# Patient Record
Sex: Female | Born: 1955 | Race: White | Hispanic: No | Marital: Single | Smoking: Heavy tobacco smoker
Health system: Southern US, Community
[De-identification: ages and names within clinical notes are randomized; demographics above are authoritative.]

## PROBLEM LIST (undated history)

## (undated) DIAGNOSIS — Z8709 Personal history of other diseases of the respiratory system: Secondary | ICD-10-CM

## (undated) DIAGNOSIS — F32A Depression, unspecified: Secondary | ICD-10-CM

## (undated) DIAGNOSIS — A419 Sepsis, unspecified organism: Secondary | ICD-10-CM

## (undated) DIAGNOSIS — G43909 Migraine, unspecified, not intractable, without status migrainosus: Secondary | ICD-10-CM

## (undated) DIAGNOSIS — Z87898 Personal history of other specified conditions: Secondary | ICD-10-CM

## (undated) DIAGNOSIS — I499 Cardiac arrhythmia, unspecified: Secondary | ICD-10-CM

## (undated) DIAGNOSIS — K759 Inflammatory liver disease, unspecified: Secondary | ICD-10-CM

## (undated) DIAGNOSIS — M1990 Unspecified osteoarthritis, unspecified site: Secondary | ICD-10-CM

## (undated) DIAGNOSIS — Z87440 Personal history of urinary (tract) infections: Secondary | ICD-10-CM

## (undated) DIAGNOSIS — I4819 Other persistent atrial fibrillation: Secondary | ICD-10-CM

## (undated) DIAGNOSIS — K644 Residual hemorrhoidal skin tags: Secondary | ICD-10-CM

## (undated) DIAGNOSIS — I2510 Atherosclerotic heart disease of native coronary artery without angina pectoris: Secondary | ICD-10-CM

## (undated) DIAGNOSIS — C801 Malignant (primary) neoplasm, unspecified: Secondary | ICD-10-CM

## (undated) DIAGNOSIS — G4730 Sleep apnea, unspecified: Secondary | ICD-10-CM

## (undated) DIAGNOSIS — E663 Overweight: Secondary | ICD-10-CM

## (undated) DIAGNOSIS — J189 Pneumonia, unspecified organism: Secondary | ICD-10-CM

## (undated) DIAGNOSIS — G8929 Other chronic pain: Secondary | ICD-10-CM

## (undated) DIAGNOSIS — F419 Anxiety disorder, unspecified: Secondary | ICD-10-CM

## (undated) DIAGNOSIS — F329 Major depressive disorder, single episode, unspecified: Secondary | ICD-10-CM

## (undated) DIAGNOSIS — Z716 Tobacco abuse counseling: Secondary | ICD-10-CM

## (undated) DIAGNOSIS — I10 Essential (primary) hypertension: Secondary | ICD-10-CM

## (undated) DIAGNOSIS — I219 Acute myocardial infarction, unspecified: Secondary | ICD-10-CM

## (undated) DIAGNOSIS — E785 Hyperlipidemia, unspecified: Secondary | ICD-10-CM

## (undated) HISTORY — PX: BREAST SURGERY: SHX581

## (undated) HISTORY — PX: CORONARY ANGIOPLASTY WITH STENT PLACEMENT: SHX49

## (undated) HISTORY — DX: Residual hemorrhoidal skin tags: K64.4

## (undated) HISTORY — DX: Other persistent atrial fibrillation: I48.19

## (undated) HISTORY — PX: ABDOMINAL HYSTERECTOMY: SHX81

## (undated) HISTORY — PX: ABDOMINAL SURGERY: SHX537

## (undated) HISTORY — PX: REPLACEMENT TOTAL KNEE: SUR1224

## (undated) HISTORY — DX: Overweight: E66.3

## (undated) HISTORY — PX: JOINT REPLACEMENT: SHX530

## (undated) HISTORY — PX: KNEE SURGERY: SHX244

## (undated) HISTORY — PX: TONSILLECTOMY AND ADENOIDECTOMY: SUR1326

## (undated) HISTORY — DX: Tobacco abuse counseling: Z71.6

## (undated) HISTORY — DX: Atherosclerotic heart disease of native coronary artery without angina pectoris: I25.10

## (undated) HISTORY — PX: TMJ ARTHROPLASTY: SHX1066

---

## 1998-06-24 ENCOUNTER — Emergency Department (HOSPITAL_COMMUNITY): Admission: EM | Admit: 1998-06-24 | Discharge: 1998-06-25 | Payer: Self-pay | Admitting: Emergency Medicine

## 1998-12-11 ENCOUNTER — Encounter: Payer: Self-pay | Admitting: Internal Medicine

## 1998-12-11 ENCOUNTER — Inpatient Hospital Stay (HOSPITAL_COMMUNITY): Admission: AD | Admit: 1998-12-11 | Discharge: 1998-12-13 | Payer: Self-pay | Admitting: Internal Medicine

## 1998-12-13 ENCOUNTER — Encounter: Payer: Self-pay | Admitting: Internal Medicine

## 1999-02-17 ENCOUNTER — Ambulatory Visit: Admission: RE | Admit: 1999-02-17 | Discharge: 1999-02-17 | Payer: Self-pay | Admitting: Internal Medicine

## 1999-03-20 ENCOUNTER — Encounter: Payer: Self-pay | Admitting: Nephrology

## 1999-03-20 ENCOUNTER — Observation Stay (HOSPITAL_COMMUNITY): Admission: RE | Admit: 1999-03-20 | Discharge: 1999-03-21 | Payer: Self-pay | Admitting: Nephrology

## 1999-03-20 ENCOUNTER — Encounter (INDEPENDENT_AMBULATORY_CARE_PROVIDER_SITE_OTHER): Payer: Self-pay | Admitting: *Deleted

## 2000-10-27 ENCOUNTER — Encounter (INDEPENDENT_AMBULATORY_CARE_PROVIDER_SITE_OTHER): Payer: Self-pay | Admitting: Specialist

## 2000-10-27 ENCOUNTER — Ambulatory Visit (HOSPITAL_BASED_OUTPATIENT_CLINIC_OR_DEPARTMENT_OTHER): Admission: RE | Admit: 2000-10-27 | Discharge: 2000-10-27 | Payer: Self-pay | Admitting: Orthopedic Surgery

## 2000-12-02 ENCOUNTER — Other Ambulatory Visit: Admission: RE | Admit: 2000-12-02 | Discharge: 2000-12-02 | Payer: Self-pay | Admitting: Obstetrics and Gynecology

## 2001-01-21 ENCOUNTER — Encounter: Payer: Self-pay | Admitting: Emergency Medicine

## 2001-01-21 ENCOUNTER — Observation Stay (HOSPITAL_COMMUNITY): Admission: EM | Admit: 2001-01-21 | Discharge: 2001-01-22 | Payer: Self-pay | Admitting: Emergency Medicine

## 2001-01-22 ENCOUNTER — Encounter: Payer: Self-pay | Admitting: Internal Medicine

## 2001-01-23 ENCOUNTER — Encounter: Payer: Self-pay | Admitting: Internal Medicine

## 2001-01-23 ENCOUNTER — Ambulatory Visit (HOSPITAL_COMMUNITY): Admission: RE | Admit: 2001-01-23 | Discharge: 2001-01-23 | Payer: Self-pay | Admitting: Internal Medicine

## 2001-12-02 ENCOUNTER — Ambulatory Visit (HOSPITAL_BASED_OUTPATIENT_CLINIC_OR_DEPARTMENT_OTHER): Admission: RE | Admit: 2001-12-02 | Discharge: 2001-12-02 | Payer: Self-pay | Admitting: Orthopedic Surgery

## 2003-06-14 ENCOUNTER — Inpatient Hospital Stay (HOSPITAL_COMMUNITY): Admission: EM | Admit: 2003-06-14 | Discharge: 2003-06-17 | Payer: Self-pay | Admitting: Emergency Medicine

## 2005-04-05 ENCOUNTER — Encounter: Admission: RE | Admit: 2005-04-05 | Discharge: 2005-04-05 | Payer: Self-pay | Admitting: Neurosurgery

## 2005-05-07 ENCOUNTER — Other Ambulatory Visit: Admission: RE | Admit: 2005-05-07 | Discharge: 2005-05-07 | Payer: Self-pay | Admitting: Obstetrics and Gynecology

## 2005-06-18 ENCOUNTER — Encounter (INDEPENDENT_AMBULATORY_CARE_PROVIDER_SITE_OTHER): Payer: Self-pay | Admitting: Specialist

## 2005-06-18 ENCOUNTER — Inpatient Hospital Stay (HOSPITAL_COMMUNITY): Admission: RE | Admit: 2005-06-18 | Discharge: 2005-06-20 | Payer: Self-pay | Admitting: Obstetrics and Gynecology

## 2007-02-17 ENCOUNTER — Ambulatory Visit: Payer: Self-pay | Admitting: Physical Medicine & Rehabilitation

## 2007-02-17 ENCOUNTER — Encounter
Admission: RE | Admit: 2007-02-17 | Discharge: 2007-05-18 | Payer: Self-pay | Admitting: Physical Medicine & Rehabilitation

## 2007-05-18 ENCOUNTER — Ambulatory Visit (HOSPITAL_BASED_OUTPATIENT_CLINIC_OR_DEPARTMENT_OTHER): Admission: RE | Admit: 2007-05-18 | Discharge: 2007-05-18 | Payer: Self-pay | Admitting: Orthopedic Surgery

## 2007-05-23 ENCOUNTER — Encounter
Admission: RE | Admit: 2007-05-23 | Discharge: 2007-08-21 | Payer: Self-pay | Admitting: Physical Medicine & Rehabilitation

## 2007-06-03 ENCOUNTER — Ambulatory Visit: Payer: Self-pay | Admitting: Infectious Diseases

## 2007-06-03 ENCOUNTER — Ambulatory Visit (HOSPITAL_COMMUNITY): Admission: RE | Admit: 2007-06-03 | Discharge: 2007-06-07 | Payer: Self-pay | Admitting: Orthopedic Surgery

## 2007-06-17 ENCOUNTER — Encounter (INDEPENDENT_AMBULATORY_CARE_PROVIDER_SITE_OTHER): Payer: Self-pay | Admitting: Orthopedic Surgery

## 2007-06-17 ENCOUNTER — Inpatient Hospital Stay (HOSPITAL_COMMUNITY): Admission: RE | Admit: 2007-06-17 | Discharge: 2007-07-02 | Payer: Self-pay | Admitting: Orthopedic Surgery

## 2007-06-22 ENCOUNTER — Encounter (INDEPENDENT_AMBULATORY_CARE_PROVIDER_SITE_OTHER): Payer: Self-pay | Admitting: Orthopedic Surgery

## 2007-06-22 ENCOUNTER — Ambulatory Visit: Payer: Self-pay | Admitting: Surgery

## 2007-07-18 ENCOUNTER — Ambulatory Visit: Payer: Self-pay | Admitting: Infectious Disease

## 2007-07-18 DIAGNOSIS — M009 Pyogenic arthritis, unspecified: Secondary | ICD-10-CM | POA: Insufficient documentation

## 2007-07-18 DIAGNOSIS — F17200 Nicotine dependence, unspecified, uncomplicated: Secondary | ICD-10-CM | POA: Insufficient documentation

## 2007-07-18 DIAGNOSIS — D649 Anemia, unspecified: Secondary | ICD-10-CM | POA: Insufficient documentation

## 2007-07-18 DIAGNOSIS — F3289 Other specified depressive episodes: Secondary | ICD-10-CM | POA: Insufficient documentation

## 2007-07-18 DIAGNOSIS — L538 Other specified erythematous conditions: Secondary | ICD-10-CM | POA: Insufficient documentation

## 2007-07-18 DIAGNOSIS — G8929 Other chronic pain: Secondary | ICD-10-CM | POA: Insufficient documentation

## 2007-07-18 DIAGNOSIS — G894 Chronic pain syndrome: Secondary | ICD-10-CM | POA: Insufficient documentation

## 2007-07-18 DIAGNOSIS — F329 Major depressive disorder, single episode, unspecified: Secondary | ICD-10-CM | POA: Insufficient documentation

## 2007-07-18 DIAGNOSIS — R21 Rash and other nonspecific skin eruption: Secondary | ICD-10-CM | POA: Insufficient documentation

## 2007-07-21 ENCOUNTER — Ambulatory Visit: Payer: Self-pay | Admitting: Physical Medicine & Rehabilitation

## 2007-07-29 ENCOUNTER — Telehealth: Payer: Self-pay | Admitting: Infectious Disease

## 2007-08-01 ENCOUNTER — Ambulatory Visit (HOSPITAL_COMMUNITY): Admission: RE | Admit: 2007-08-01 | Discharge: 2007-08-01 | Payer: Self-pay | Admitting: Infectious Disease

## 2007-08-02 ENCOUNTER — Ambulatory Visit: Payer: Self-pay | Admitting: Physical Medicine & Rehabilitation

## 2007-08-08 ENCOUNTER — Ambulatory Visit: Payer: Self-pay | Admitting: Infectious Disease

## 2007-08-09 ENCOUNTER — Encounter: Payer: Self-pay | Admitting: Infectious Disease

## 2007-08-11 ENCOUNTER — Telehealth: Payer: Self-pay | Admitting: Infectious Disease

## 2007-08-15 ENCOUNTER — Encounter: Payer: Self-pay | Admitting: Infectious Disease

## 2007-08-24 ENCOUNTER — Telehealth: Payer: Self-pay | Admitting: Infectious Disease

## 2007-08-25 ENCOUNTER — Encounter: Payer: Self-pay | Admitting: Infectious Disease

## 2007-08-25 ENCOUNTER — Encounter
Admission: RE | Admit: 2007-08-25 | Discharge: 2007-11-23 | Payer: Self-pay | Admitting: Physical Medicine & Rehabilitation

## 2007-08-27 ENCOUNTER — Ambulatory Visit (HOSPITAL_COMMUNITY): Admission: RE | Admit: 2007-08-27 | Discharge: 2007-08-27 | Payer: Self-pay | Admitting: Infectious Disease

## 2007-08-29 ENCOUNTER — Telehealth: Payer: Self-pay

## 2007-08-29 ENCOUNTER — Encounter: Payer: Self-pay | Admitting: Infectious Disease

## 2007-09-06 ENCOUNTER — Ambulatory Visit: Payer: Self-pay | Admitting: Physical Medicine & Rehabilitation

## 2007-09-13 ENCOUNTER — Encounter: Payer: Self-pay | Admitting: Infectious Disease

## 2007-10-04 ENCOUNTER — Telehealth: Payer: Self-pay | Admitting: Infectious Disease

## 2007-10-20 ENCOUNTER — Ambulatory Visit: Payer: Self-pay | Admitting: Physical Medicine & Rehabilitation

## 2007-11-07 ENCOUNTER — Encounter: Payer: Self-pay | Admitting: Infectious Disease

## 2007-11-21 ENCOUNTER — Inpatient Hospital Stay (HOSPITAL_COMMUNITY): Admission: RE | Admit: 2007-11-21 | Discharge: 2007-11-25 | Payer: Self-pay | Admitting: Orthopedic Surgery

## 2007-11-21 ENCOUNTER — Encounter (INDEPENDENT_AMBULATORY_CARE_PROVIDER_SITE_OTHER): Payer: Self-pay | Admitting: Orthopedic Surgery

## 2007-12-19 ENCOUNTER — Ambulatory Visit: Payer: Self-pay | Admitting: Physical Medicine & Rehabilitation

## 2007-12-19 ENCOUNTER — Encounter
Admission: RE | Admit: 2007-12-19 | Discharge: 2008-03-18 | Payer: Self-pay | Admitting: Physical Medicine & Rehabilitation

## 2008-01-17 ENCOUNTER — Ambulatory Visit: Payer: Self-pay | Admitting: Physical Medicine & Rehabilitation

## 2008-01-26 ENCOUNTER — Ambulatory Visit: Payer: Self-pay | Admitting: Physical Medicine & Rehabilitation

## 2008-02-16 ENCOUNTER — Ambulatory Visit: Payer: Self-pay | Admitting: Physical Medicine & Rehabilitation

## 2008-03-15 ENCOUNTER — Ambulatory Visit: Payer: Self-pay | Admitting: Physical Medicine & Rehabilitation

## 2008-03-19 ENCOUNTER — Encounter: Payer: Self-pay | Admitting: Infectious Disease

## 2008-04-06 ENCOUNTER — Encounter
Admission: RE | Admit: 2008-04-06 | Discharge: 2008-04-30 | Payer: Self-pay | Admitting: Physical Medicine & Rehabilitation

## 2008-04-08 ENCOUNTER — Ambulatory Visit (HOSPITAL_COMMUNITY)
Admission: RE | Admit: 2008-04-08 | Discharge: 2008-04-08 | Payer: Self-pay | Admitting: Physical Medicine & Rehabilitation

## 2008-04-09 ENCOUNTER — Ambulatory Visit: Payer: Self-pay | Admitting: Physical Medicine & Rehabilitation

## 2008-04-12 ENCOUNTER — Ambulatory Visit: Payer: Self-pay | Admitting: Physical Medicine & Rehabilitation

## 2008-04-30 ENCOUNTER — Ambulatory Visit: Payer: Self-pay | Admitting: Physical Medicine & Rehabilitation

## 2008-05-25 ENCOUNTER — Ambulatory Visit: Payer: Self-pay | Admitting: Physical Medicine & Rehabilitation

## 2008-05-25 ENCOUNTER — Encounter
Admission: RE | Admit: 2008-05-25 | Discharge: 2008-08-17 | Payer: Self-pay | Admitting: Physical Medicine & Rehabilitation

## 2008-06-22 ENCOUNTER — Ambulatory Visit: Payer: Self-pay | Admitting: Physical Medicine & Rehabilitation

## 2008-07-09 ENCOUNTER — Ambulatory Visit (HOSPITAL_COMMUNITY): Admission: RE | Admit: 2008-07-09 | Discharge: 2008-07-09 | Payer: Self-pay | Admitting: Orthopedic Surgery

## 2008-07-20 ENCOUNTER — Ambulatory Visit: Payer: Self-pay | Admitting: Physical Medicine & Rehabilitation

## 2008-08-17 ENCOUNTER — Encounter
Admission: RE | Admit: 2008-08-17 | Discharge: 2008-08-22 | Payer: Self-pay | Admitting: Physical Medicine & Rehabilitation

## 2008-08-22 ENCOUNTER — Ambulatory Visit: Payer: Self-pay | Admitting: Physical Medicine & Rehabilitation

## 2009-09-14 IMAGING — CR DG CHEST 2V
2 series · 2 of 2 positions shown · non-contrast
Comparison: 06/14/2003

CLINICAL DATA: Preoperative respiratory film for a patient with right knee infection. 
 CHEST ? 2 VIEW:

[view not recorded (1 of 2)]
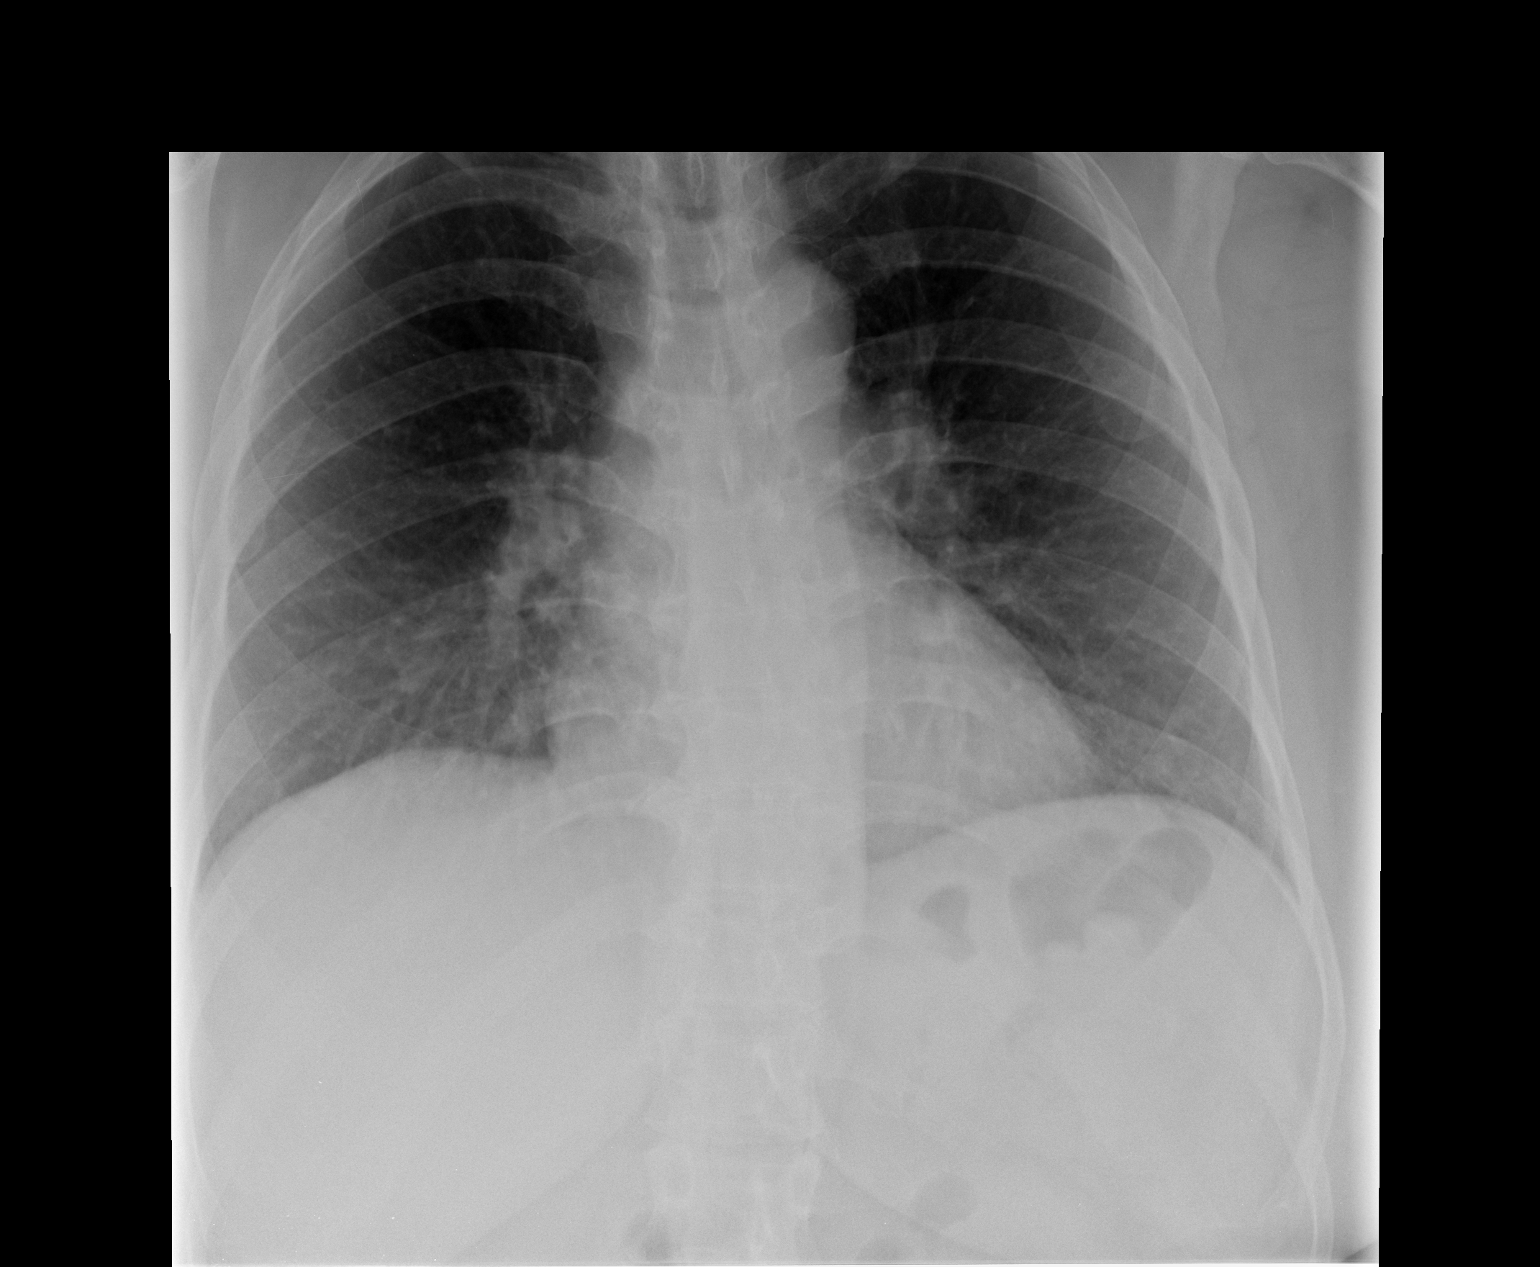

[view not recorded (2 of 2)]
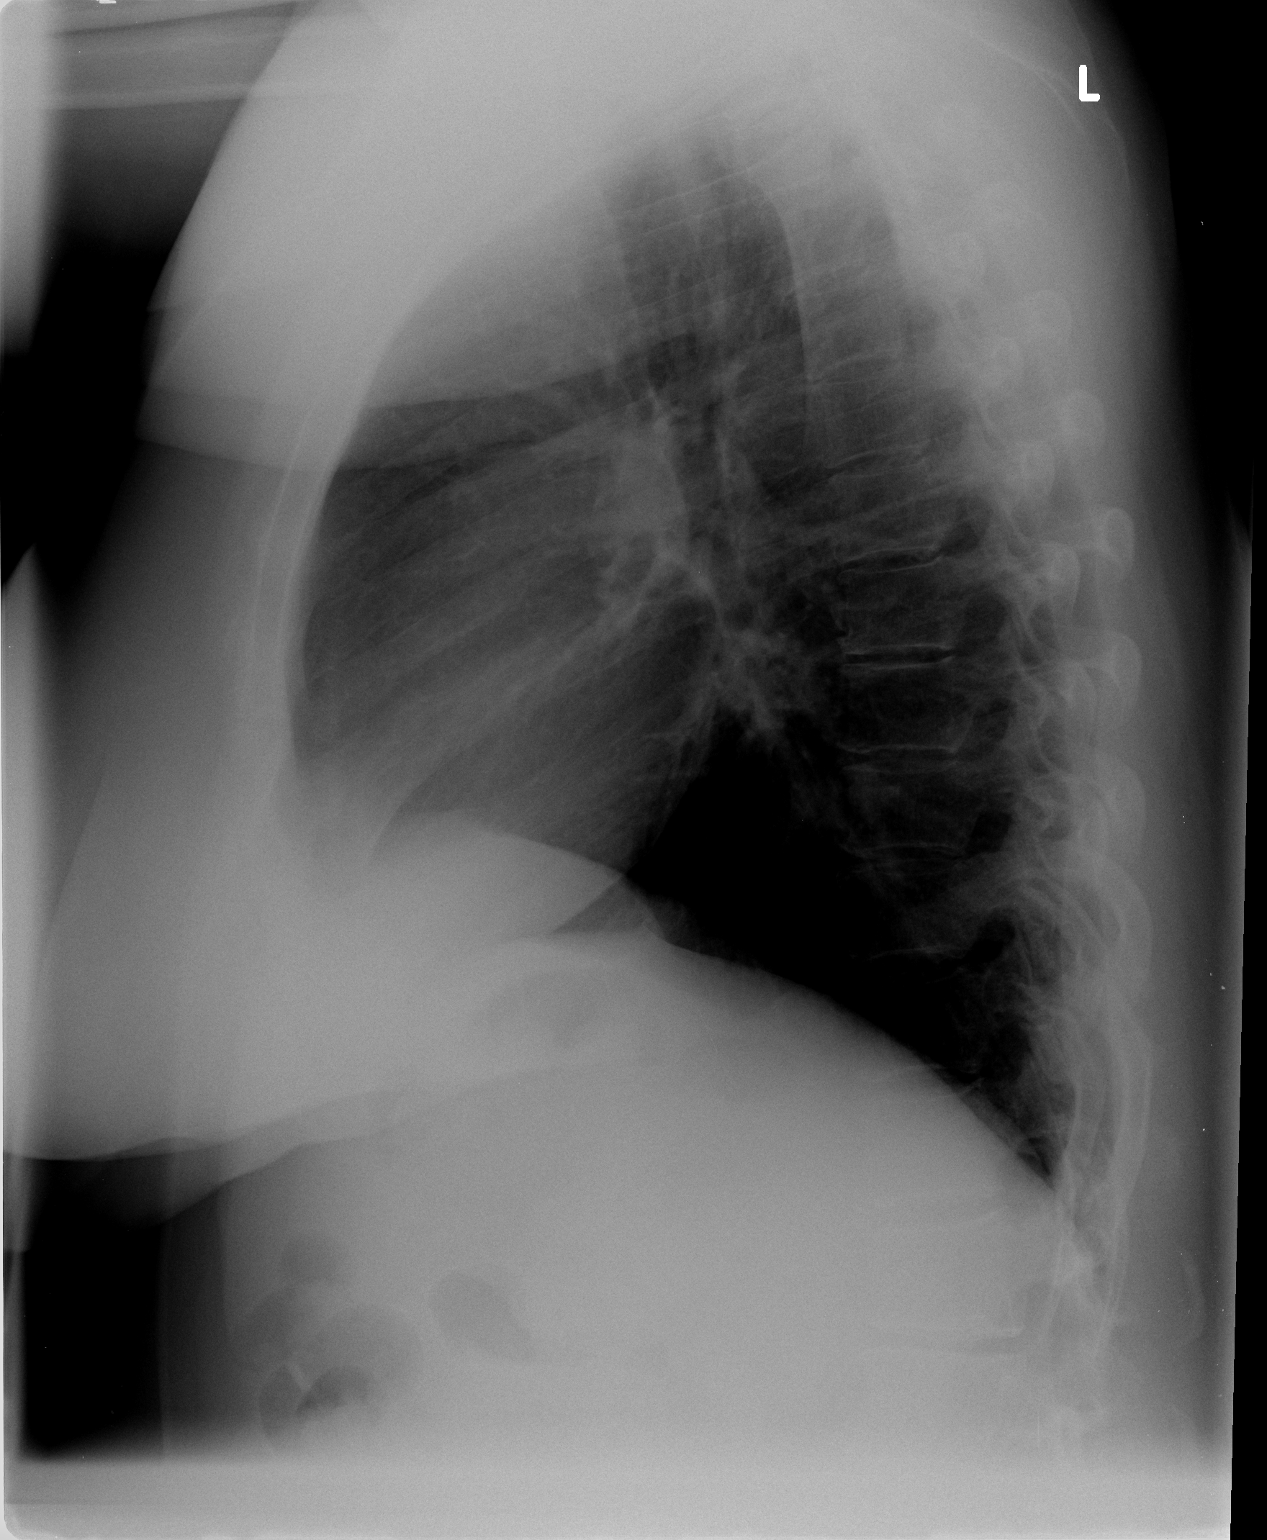

[2 of 2 positions shown; findings below may reference images not displayed]

FINDINGS: Lungs are clear.  Heart size is normal. No effusion or focal bony abnormality.
IMPRESSION: No acute disease.

## 2009-09-17 IMAGING — CR DG CHEST 1V PORT
2 series · 2 of 2 positions shown · non-contrast
Comparison: none

CLINICAL DATA: Knee infection, PICC placement

Portable chest at 9994:
Comparison 06/03/2007. Left arm PICC extends to the distal SVC. Lungs clear. No
effusion. Heart size is normal.

[AP (1 of 2)]
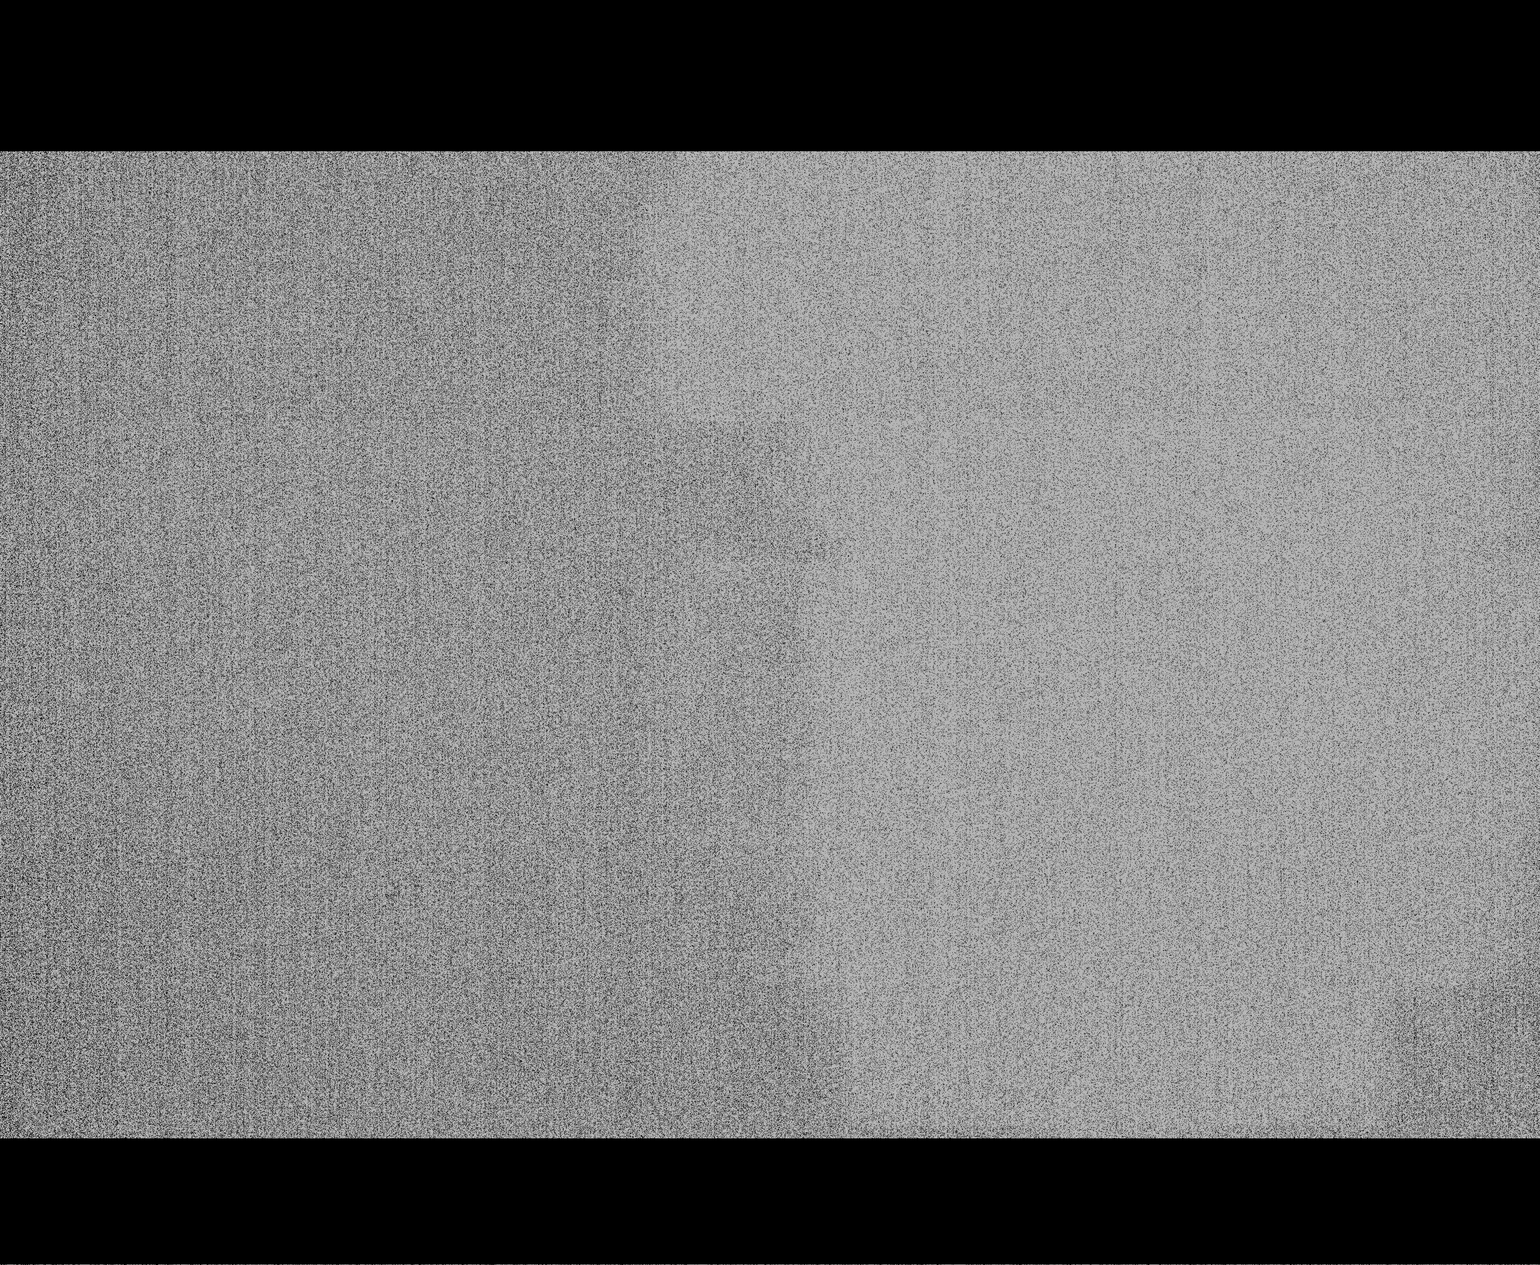

[AP (2 of 2)]
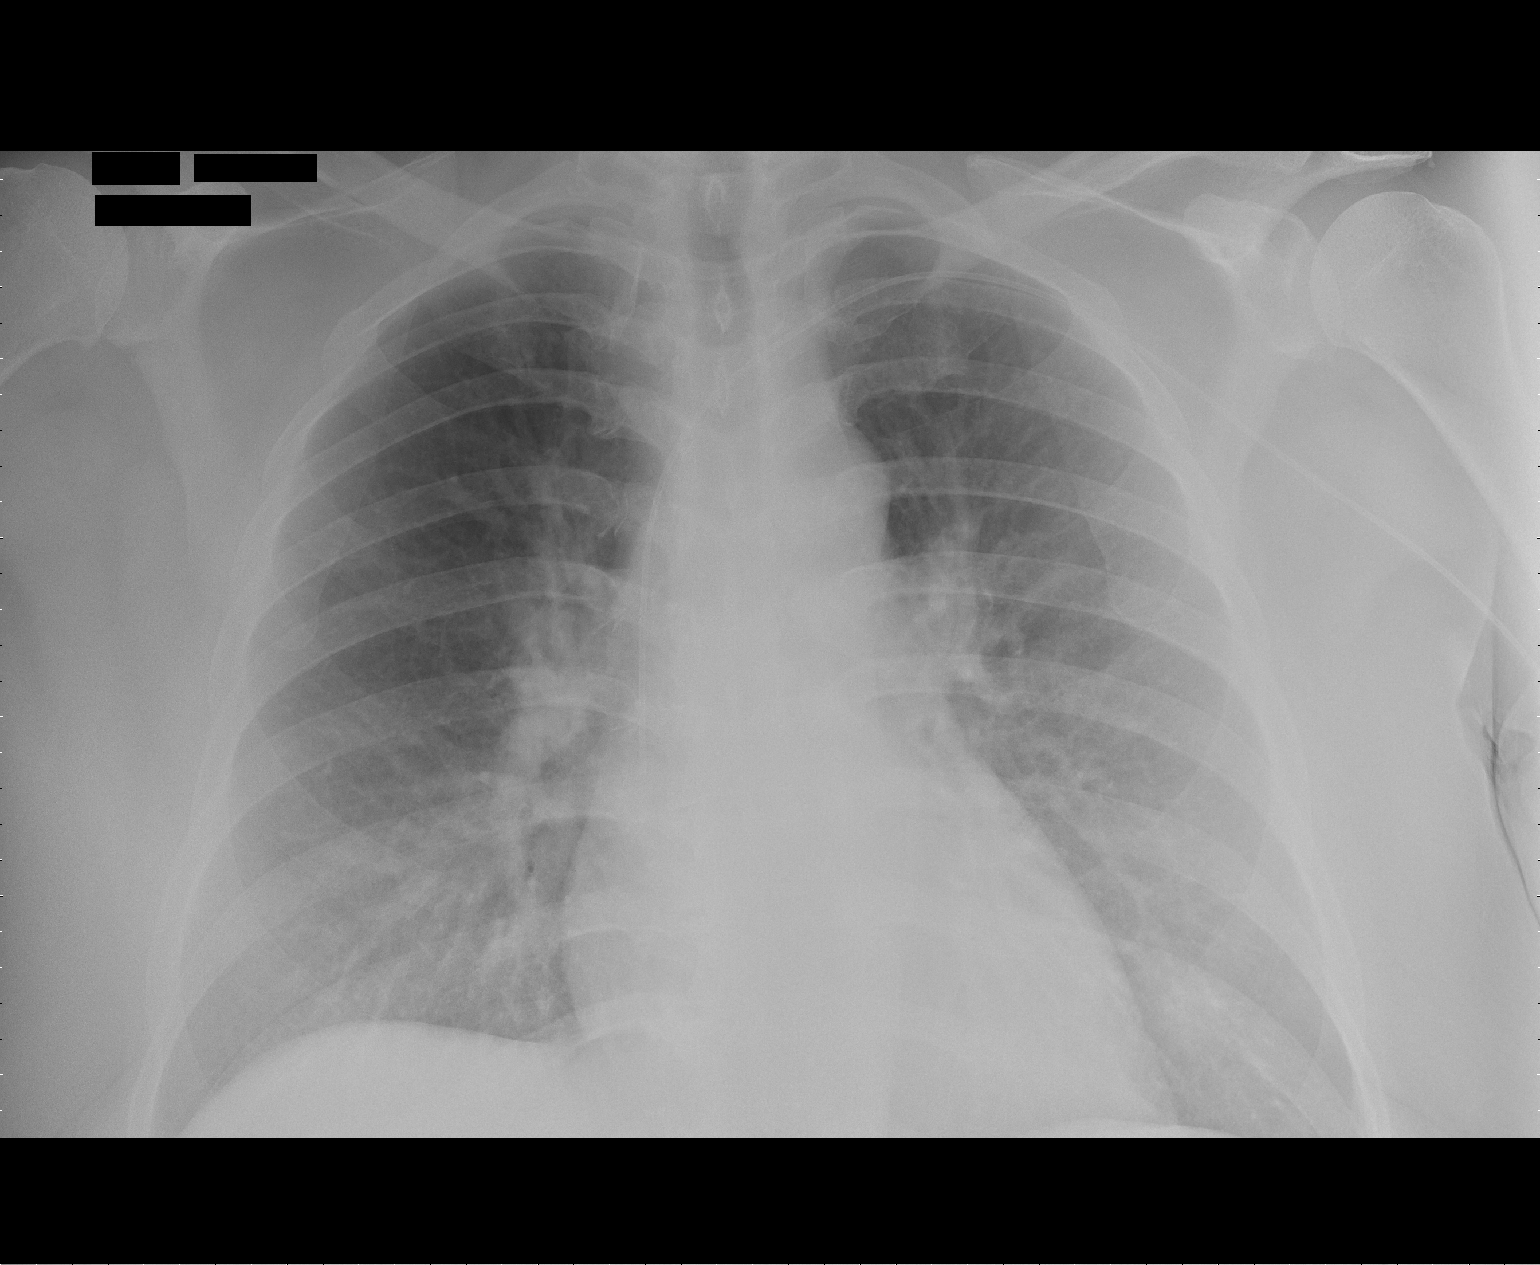

[2 of 2 positions shown; findings below may reference images not displayed]

IMPRESSION: 1. PICC line to low SVC.

## 2010-05-25 ENCOUNTER — Encounter: Payer: Self-pay | Admitting: Physical Medicine and Rehabilitation

## 2010-05-26 ENCOUNTER — Encounter: Payer: Self-pay | Admitting: Orthopedic Surgery

## 2010-08-14 LAB — HEMOGLOBIN AND HEMATOCRIT, BLOOD
HCT: 43.6 % (ref 36.0–46.0)
Hemoglobin: 14.7 g/dL (ref 12.0–15.0)

## 2010-08-14 LAB — BASIC METABOLIC PANEL
Calcium: 9.4 mg/dL (ref 8.4–10.5)
GFR calc Af Amer: >60 mL/min (ref >60)
GFR calc non Af Amer: >60 mL/min (ref >60)
Glucose, Bld: 106 mg/dL — ABNORMAL HIGH (ref 70–99)
Potassium: 3.1 mEq/L — ABNORMAL LOW (ref 3.5–5.1)
Sodium: 141 mEq/L (ref 135–145)

## 2010-09-16 NOTE — Procedures (Signed)
[name], [name]                   ACCOUNT NO.:  [id number]   MEDICAL RECORD NO.:  [id number]         PATIENT TYPE:  AECP   LOCATION:                                 FACILITY:   PHYSICIAN:  Erick Colace, M.D.DATE OF BIRTH:  1955/09/07   DATE OF PROCEDURE:  02/16/2008  DATE OF DISCHARGE:                               OPERATIVE REPORT   A 55 year old female with back pain, buttock pain, hip pain.  Pain did  not respond well to the bilateral sacroiliac injection under  fluoroscopic guidance performed on January 26, 2008.   PROCEDURE:  Bilateral L5 dorsal ramus injection, bilateral L4 medial  branch block, bilateral L3 medial branch block under fluoroscopic  guidance.   INDICATIONS:  Axial lumbar pain and buttock pain.   Pain persists despite narcotic analgesic medications.   The patient placed prone on fluoroscopy table.  Betadine prep, sterile  drape, 25-gauge 1-1/2 inch needle was used to anesthetize the skin and  subcu tissue, 1% lidocaine x2 mL at each of 6 sites.  Then, a 22-gauge 3-  1/2 inch spinal needle was inserted under fluoroscopic guidance starting  first at the left S1 SAP sacral alar junction, AP and lateral and  oblique images utilized.  The left S1 SAP sacral alar junction targeted,  bone contact made confirmed with lateral imaging.  Omnipaque 180 under  live fluoro demonstrated no intravascular uptake, then 0.5 mL of  solution containing 1 mL of 4 mg/mL dexamethasone and 2 mL of 2% MPF  lidocaine.  This was injected x0.5 mL, then the left L5 SAP transverse  process junction targeted, bone contact made.  Omnipaque 180 under live  fluoro x0.5 mL demonstrated no intravascular uptake, then 0.5 mL  dexamethasone lidocaine solution injected and left L4 SAP transverse  process junction targeted, bone contact made, Omnipaque 180 x 0.5 mL  under live fluoro demonstrated no intravascular uptake, then 0.5 mL of  dexamethasone lidocaine solution was injected.  The  same procedure was  repeated on the right side at corresponding levels using a needle  injectate and technique.  The patient tolerated the procedure well.  Pre  and post injection vitals stable.  Post injection instructions given.      Erick Colace, M.D.     AEK/MEDQ  D:  02/16/2008 14:44:16  T:  02/17/2008 04:54:09  Job:  [id number]   cc:   Ollen Gross, M.D.  Fax: [phone]

## 2010-09-16 NOTE — Op Note (Signed)
NAMEIVIANNA, NOTCH                   ACCOUNT NO.:  [id number]   MEDICAL RECORD NO.:  [id number]          PATIENT TYPE:  AMB   LOCATION:  DAY                          FACILITY:  [facility]   PHYSICIAN:  Ollen Gross, M.D.    DATE OF BIRTH:  1955-08-26   DATE OF PROCEDURE:  07/09/2008  DATE OF DISCHARGE:  07/09/2008                               OPERATIVE REPORT   PREOPERATIVE DIAGNOSIS:  Arthrofibrosis, right knee.   POSTOPERATIVE DIAGNOSIS:  Arthrofibrosis, right knee.   PROCEDURE:  Right knee closed manipulation.   SURGEON:  Dr. Lequita Halt.  No assistant.   ANESTHESIA:  General.   Pre-manipulation range of motion 0 to 90, post-manipulation range of  motion 0 to 135.   COMPLICATIONS:  None.   CONDITION:  Stable to recovery.   CLINICAL NOTE:  [name] is a 55 year old female who has a very complex  history in regards to her right knee.  She had a total knee arthroplasty  done last summer and did very well with it initially.  She has gone on  to develop limited range of motion over the past 2-3 months, and she has  been unable to exercise.  On her last visit, she had range of motion 0  to 90 degrees while previously she was bending it up to 120 degrees.  She presents now for closed manipulation.   PROCEDURE IN DETAIL:  After successful initiation of general anesthetic,  exam under anesthesia was performed.  She had a range of about 0 to 100.  After gentle manipulation with me putting my chest up against her  proximal tibia, I bent the knee, and with minimal resistance initially  and audible lysis of adhesions, I was able to get her to 135 degrees  without difficulty.  She maintained her full extension.  Flexion against  gravity after that was down to 135.  She was subsequently awakened and  transported to recovery in stable condition.      Ollen Gross, M.D.  Electronically Signed     FA/MEDQ  D:  07/09/2008  T:  07/10/2008  Job:  [id number]

## 2010-09-16 NOTE — Procedures (Signed)
NAMEARNEDA, [name]                   ACCOUNT NO.:  [id number]   MEDICAL RECORD NO.:  [id number]         PATIENT TYPE:  AECP   LOCATION:                                 FACILITY:   PHYSICIAN:  Erick Colace, M.D.DATE OF BIRTH:  01/19/1956   DATE OF PROCEDURE:  02/16/2008  DATE OF DISCHARGE:                               OPERATIVE REPORT   A 55 year old female with back pain, buttock pain, hip pain.  Pain did  not respond well to the bilateral sacroiliac injection under  fluoroscopic guidance performed on January 26, 2008.   PROCEDURE:  Bilateral L5 dorsal ramus injection, bilateral L4 medial  branch block, bilateral L3 medial branch block under fluoroscopic  guidance.   INDICATIONS:  Axial lumbar pain and buttock pain.   Pain persists despite narcotic analgesic medications.   The patient placed prone on fluoroscopy table.  Betadine prep, sterile  drape, 25-gauge 1-1/2 inch needle was used to anesthetize the skin and  subcu tissue, 1% lidocaine x2 mL at each of 6 sites.  Then, a 22-gauge 3-  1/2 inch spinal needle was inserted under fluoroscopic guidance starting  first at the left S1 SAP sacral alar junction, AP and lateral and  oblique images utilized.  The left S1 SAP sacral alar junction targeted,  bone contact made confirmed with lateral imaging.  Omnipaque 180 under  live fluoro demonstrated no intravascular uptake, then 0.5 mL of  solution containing 1 mL of 4 mg/mL dexamethasone and 2 mL of 2% MPF  lidocaine.  This was injected x0.5 mL, then the left L5 SAP transverse  process junction targeted, bone contact made.  Omnipaque 180 under live  fluoro x0.5 mL demonstrated no intravascular uptake, then 0.5 mL  dexamethasone lidocaine solution injected and left L4 SAP transverse  process junction targeted, bone contact made, Omnipaque 180 x 0.5 mL  under live fluoro demonstrated no intravascular uptake, then 0.5 mL of  dexamethasone lidocaine solution was injected.  The  same procedure was  repeated on the right side at corresponding levels using a needle  injectate and technique.  The patient tolerated the procedure well.  Pre  and post injection vitals stable.  Post injection instructions given.      Erick Colace, M.D.  Electronically Signed     AEK/MEDQ  D:  02/16/2008 14:44:16  T:  02/17/2008 02:28:07  Job:  [id number]   cc:   Ollen Gross, M.D.  Fax: [phone]

## 2010-09-16 NOTE — Assessment & Plan Note (Signed)
The patient's last visit was approximately 1 month ago.  She had been  maintained on Duragesic patch 62 mcg, went down to 50 x2 weeks and then  down to 25.  The patient did okay, gone from 62 to 50; but  she went  down to 25, she had an increase in her pain not only in her knee but  also in her back.   Her main complaint right now in fact is her back.  Her average pain is  4/10 but currently 7, described as intermittent, stabbing, and constant  aching.  General activity is inhibited at a 7/10 level.  Sleep is poor.  Pain improves with rest, heat, medications, and injections in the past.  No recent back injections.  She has had some with Dr. Zipporah Plants in the  past.  She is driving now.  She is going to physical therapy for her  knee.   REVIEW OF SYSTEMS:  Positive for numbness and trouble walking.  Numbness  is around the postsurgical area and anxiety as well.  Her Oswestry  disability questionnaire was completed, overall score of 60% which is in  the severe disability range.   PHYSICAL EXAMINATION:  VITAL SIGNS:  Her blood pressure is 140/79, pulse  80, respiratory rate 18, and O2 sat 97% on room air.  GENERAL:  No acute distress.  Orientation x3.  Affect is alert.  Gait is  with a cane.  No longer he is using a wheelchair.  EXTREMITIES:  Showed no evidence of edema.  She has hypersensitivity  over her right knee even to light palpation.  No evidence of knee  effusion.  No evidence of distal vascular changes or vasomotor changes.  BACK:  Tenderness to palpation in PSIS area as well as lumbar  paraspinal.  Pain is worse with extension than with flexion.  Faber's  maneuver caused some SI area tenderness but not in the hip with limited  in terms of the right knee because of the sensitivity to touch while  performing that maneuver.  Deep tendon reflexes are normal in lower  extremities.  The right knee not tested.  The patient requests no DTRs  on that side due to pain.   IMPRESSION:  1.  Postoperative pain status post knee replacement.  Overall doing      better and functioning better.  She is approximately 2 months      postoperative.  She does have chronic pain in the knee extending      back prior to arthroscopic intervention but overall this is      diminishing.  2. Chronic low back pain.  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   In terms of her Duragesic patch, we will back up to 50 mcg for the next  month and then consider backing down to 37 rather than 25 in a gradual  fashion with a goal getting back to her usual 25 mcg dosage.  If her  sacroiliac injection is not particularly helpful, consider facet medial  branch blocks.      Erick Colace, M.D.  Electronically Signed     AEK/MedQ  D:  01/17/2008 13:08:16  T:  01/18/2008 05:04:57  Job #:  [id number]   cc:  Ollen Gross, M.D.  Fax: [phone]   Jethro Bastos, M.D.  Fax: [phone]

## 2010-09-16 NOTE — H&P (Signed)
[name], [name]                   ACCOUNT NO.:  [id number]   MEDICAL RECORD NO.:  [id number]          PATIENT TYPE:  INP   LOCATION:  1612                         FACILITY:  [facility]   PHYSICIAN:  Ollen Gross, M.D.    DATE OF BIRTH:  05-25-1955   DATE OF ADMISSION:  11/21/2007  DATE OF DISCHARGE:  11/25/2007                              HISTORY & PHYSICAL   CHIEF COMPLAINT:  Right knee pain.   HISTORY OF PRESENT ILLNESS:  The patient is a 55 year old female who has  been seen by Dr. Lequita Halt for ongoing knee pain.  It is progressively  getting worse.  [name] had a significant history of having infection in her  knee years ago following knee arthroscopy.  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  Risks  and benefits have been discussed.  [name] elects to proceed with surgery.  [name] will be set up for either resection versus a total arthroplasty.   ALLERGIES:  No known drug allergies.   CURRENT MEDICATIONS:  Hydrochlorothiazide/lisinopril, Lasix, Vytorin,  Robaxin, Premarin, Ativan, Cymbalta, fentanyl patches, Lunesta, and  Motrin.   PAST MEDICAL HISTORY:  1. Migraines.  2. Anxiety.  3. Depression.  4. History of bronchitis.  5. History of pneumonia.  6. Hypertension.  7. Hypercholesterolemia.  8. Past history of hepatitis ([name] believes it was hepatitis A many      years ago).  9. Diet-controlled diabetes mellitus.  10.Degenerative disk disease.  11.Past history of strep infection of the right knee.  12.Postmenopausal.  13.Childhood illnesses to include measles and mumps.   PAST SURGICAL HISTORY:  1. Hysterectomy.  2. TMJ surgery.  3. Benign pelvic tumor excision and a knee scope on the right knee      that was complicated by infection.   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  Current smoker, 10 cigarettes a day  for about 10 years.  Rare  alcohol.  Lives alone.  Does want to look into rehab facility  postoperative.   FAMILY HISTORY:  Father deceased with history of cancer.  Mother poor  health, heart disease.  Has a sister in good health.   REVIEW OF SYSTEMS:  GENERAL:  No fevers, chills.  Occasional night  sweats.  NEUROLOGICAL:  No seizures, syncope or paralysis.  RESPIRATORY:  No shortness breath, productive cough or hemoptysis.  CARDIOVASCULAR:  No chest pain, angina, orthopnea.  GASTROINTESTINAL:  No nausea,  vomiting, diarrhea.  [name] does have some constipation.  GENITOURINARY:  No dysuria, hematuria, or discharge.  MUSCULOSKELETAL:  Joint pain on  the right knee.   PHYSICAL EXAMINATION:  VITAL SIGNS:  Pulse 84, respirations 16, blood  pressure 128/70.  GENERAL:  A 55 year old female, well-nourished, well-developed, no acute  distress, slightly overweight, alert, oriented, and cooperative.  HEENT:  Normocephalic, atraumatic.  Pupils are reactive.  EOMs intact.  NECK:  Supple.  CHEST:  Clear.  HEART:  Regular rate rhythm with a  faint early systolic ejection murmur  best heard over the aortic point.  ABDOMEN:  Soft, slightly round.  Bowel sounds present.  RECTAL, BREAST, GENITALIA:  Not done, not pertinent to present illness.  EXTREMITIES:  Right knee.  Range of motion 0 to 60, no warmth, no  swelling.   IMPRESSION:  Right knee osteoarthritis post infection.   PLAN:  The patient admitted to [facility] to undergo right  total knee arthroplasty versus resection arthroplasty.  Surgery will be  performed by Dr. Ollen Gross.      Alexzandrew L. Perkins, P.A.C.      Ollen Gross, M.D.  Electronically Signed    ALP/MEDQ  D:  12/08/2007  T:  12/08/2007  Job:  [id number]   cc:   Ollen Gross, M.D.  Fax: [phone]   Jethro Bastos, M.D.  Fax: [phone]

## 2010-09-16 NOTE — Procedures (Signed)
[name], ROSCH                   ACCOUNT NO.:  [id number]   MEDICAL RECORD NO.:  [id number]           PATIENT TYPE:   LOCATION:                                 FACILITY:   PHYSICIAN:  Erick Colace, M.D.DATE OF BIRTH:  02/04/1956   DATE OF PROCEDURE:  DATE OF DISCHARGE:                               OPERATIVE REPORT   This is a right L5 dorsal ramus radiofrequency neurotomy, right L4  medial branch radiofrequency neurotomy, right L3 medial branch  radiofrequency neurotomy under lumbar and sacral fluoroscopic guidance.   Informed consent was obtained after describing risks and benefits of the  procedure with the patient.  These include bleeding, bruising, and  infection.  The potential benefits include reduction of pain to allow  for better mobility and self-care.  Currently, she has limitations due  to her back pain and pain is only partially responsive to narcotic  analgesic medications.  She has had good relief on the left side with  left-sided procedure at corresponding levels performed on April 12, 2008.   The patient was placed prone on fluoroscopy table.  Betadine prep,  sterile drape.  A 25-gauge inch and a half needle was used to  anesthetize the skin and the subcutaneous tissue with 1% lidocaine x2 mL  at each of 3 sites.  Then, a 20-gauge 10-cm RF needle with 10-mm curved  active tip was inserted under fluoroscopic guidance targeting the left  S1 SAP sacral ala junction, bone contact made, confirmed with lateral  imaging.  Sensory stem at 50 Hz followed by motor stem at 2 Hz confirmed  proper needle location followed by injection of 1 mL of the solution  containing 1 mL of 40 mg/mL dexamethasone and 2 mL of 1% MPF lidocaine  followed by radiofrequency lesioning initially at 70 degrees Celsius.  The patient did not tolerate, went down to 60 degrees Celsius after  infiltrating an additional 0.5 mL of dexamethasone lidocaine solution  and did radiofrequency  lesioning x70 seconds.  Then, the left L5 SAP  transverse process junction was targeted, bone contact made, confirmed  with lateral imaging.  Sensory stem at 50 Hz followed by motor stem at 2  Hz confirmed proper needle location followed by injection of 1 mL of  dexamethasone lidocaine solution and radiofrequency lesioning at 70  degrees Celsius for 70 seconds.  Then, the right L4 SAP transverse  process junction targeted, bone contact made, confirmed with lateral  imaging.  Sensory stem at 50 Hz followed by motor stem at 2 Hz confirmed  proper needle location followed by injection of 1 mL of dexamethasone  lidocaine solution and radiofrequency lesioning at 70 degrees Celsius  for 70 seconds.  The patient tolerated the procedure well.  Pre and  postinjection vitals stable.  Postinjection instructions given.      Erick Colace, M.D.  Electronically Signed     AEK/MEDQ  D:  05/01/2008 10:07:54  T:  05/01/2008 22:43:21  Job:  [id number]

## 2010-09-16 NOTE — Assessment & Plan Note (Signed)
DATE OF LAST VISIT:  March 29, 2007.   DATE OF INITIAL VISIT:  February 18, 2007.   She was initially seen for chronic low back pain management.   INTERVAL HISTORY:  Rather extensive.  She has had some arthroscopic  surgery right knee and a posterior horn medial meniscal tear,  posterolateral meniscal tear, grade IV changes lateral femoral  compartment.  These were debrided.  Grade IV changes in the medial  compartment.  Grade III-IV changes in the patellofemoral compartment  debrided.  She had some persistent drainage in the medial portal joint  fluid, placed on antibiotics.  She was placed in a knee immobilizer.  She had persistent effusion after the drainage stopped.  She had an  elevated white count on fluid analysis and had been referred to  infectious disease.  Cultures grew a few streptococcus viridans.  She  was placed on home IV antibiotics.  Her sed rate was measured at 96.  She was taken to the OR as well and washed out.   In terms of pain medications, she has had since I last saw her, her  Fentanyl increased from 25 mcg to 50 mcg and also started on OxyContin  40 b.i.d. as well as Percocet 5/325 q.i.d. and up to 6 tablets per day.  Despite this increase, she does not feel her pain is really any better  and still rates it as severe and interfering with her ability to go  through her physical therapy.  She has had trouble regaining motion in  knee flexion as well as extension but most particularly flexion.   SURGICAL HISTORY:  1. Arthroscopy medial lateral meniscectomy chondroplasty on May 18, 2007.  2. Synovectomy irrigation, June 03, 2007.  3. Irrigation and debridement, June 17, 2007.   She denies any abnormal sweating or dryness in the left leg.  No  hypersensitivity in the foot or regions other than the knee.  Her knee  pain is anterior.  No posterior pain.  She has had no skin  discoloration.   She rates her average pain as a 4 but currently at  a 6 but with movement  it gets up to a 10.  The pain is described as sharp, intermittent,  stabbing, constant, aching, worse with walking.  She is using a walker.  The pain improves with rest, medication, and modalities.   She uses a wheelchair for longer distances.  She is not driving or  climbing steps.  She has not worked since May 17, 2007.  She needs  assistance with dressing bathing, household duties, and shopping.   REVIEW OF SYSTEMS:  Positive for numbness, tremor, trouble walking,  spasms, depression, anxiety.  Review of systems positive for  constipation.   SOCIAL HISTORY:  She lives by herself.   PHYSICAL EXAMINATION:  MENTAL STATUS:  Mood and affect labile.  She  states that she is very upset about the limitations she is experiencing  right now.  MUSCULOSKELETAL:  Her knee has 30-45 degrees of flexion and lacks about  10-20 degrees of extension on the right side.  Normal on the left side.  She has normal pulses popliteal, pedal, and posterior tibial.  She has  no hypersensitivity to touch over the foot and only mild  hypersensitivity to touch over the knee.  She does have a mild joint  effusion in the knee and mild to moderate erythema over the patella.  She has no medial or lateral joint line  tenderness.  No posterior masses  palpated.  There is no swelling below the knee.  Her knee strength is  inhibited by pain 3 minus over 5 in the knee extensor flexor but full  strength in the ankle dorsiflexor, plantar flexor, and toe flexor  extensors.  She transfers independently from wheelchair to the bed but  goes very slowly and has decreased weightbearing on the right lower  extremity.   IMPRESSION:  Chronic postoperative knee pain.   In my discussion with Dr. Luiz Blare, her pain is above and beyond what  would be normally expected following this type of procedure and as I  discussed with the patient, her being on narcotic analgesics prior to  the procedure certainly made  postoperative pain management more  difficult.  I also talked about how narcotic analgesics have some  limited efficacy in the chronic stage.  I discussed the multi-modal approach including addition of adjuvant pain  medicine.  She is currently on Neurontin but only 100 b.i.d.  I would  like her to stop taking that and instead start on Cymbalta 30 mg p.o.  daily.  We will stop the OxyContin oxycodone and start her on a higher  dose of Fentanyl patch 75 mcg two weeks supply, we might have to go to  100.  I also discussed femoral nerve block, also referring her to Dr. Gladstone Pih, Ph.D. from rehab psychology to go over coping mechanisms,  relaxation, anxiety and depression management.      Erick Colace, M.D.  Electronically Signed     AEK/MedQ  D:  07/21/2007 15:46:07  T:  07/21/2007 17:03:15  Job #:  [id number]   cc:   Harvie Junior, M.D.  Fax: [phone]   Acey Lav, MD  Fax: [phone]

## 2010-09-16 NOTE — Op Note (Signed)
NAMEORIS, CALMES                   ACCOUNT NO.:  [id number]   MEDICAL RECORD NO.:  [id number]          PATIENT TYPE:  OIB   LOCATION:  5009                         FACILITY:  [facility]   PHYSICIAN:  Harvie Junior, M.D.   DATE OF BIRTH:  [birth year]   DATE OF PROCEDURE:  06/03/2007  DATE OF DISCHARGE:                               OPERATIVE REPORT   PREOPERATIVE DIAGNOSIS:  Infected right knee, status post knee  arthroscopy.   POSTOPERATIVE DIAGNOSIS:  Infected right knee, status post knee  arthroscopy.   PRINCIPAL PROCEDURES:  1. Extensive synovectomy.  2. Irrigation and debridement of right knee with 9 L of normal saline      irrigation, 1000 L of bug juice, and placement of a Hemovac drain.   SURGEON:  Harvie Junior, MD   ASSISTANT:  Marshia Ly, PA   ANESTHESIA:  General.   BRIEF HISTORY:  Ms. Redwine is a 55 year old female who is about 2-1/2  weeks out from a knee arthroscopy for degenerative knee with some  degenerative meniscal tears and 3 compartment degenerative change.  She  postoperatively had some good pain relief and then began having  worsening pain relief.  She had a persistent an chronic draining sinus  on the anterior aspect of the knee for which we placed her on Keflex  prophylactically.  She continued to have increasing pain.  She never  really had fever.  She never really had a warm or red knee, but she had  increasing pain over that period of time.  We did an aspiration of her  once we finally got her portal to seal over.  This aspiration showed  unfortunately 55,000 white blood cells.  The white blood cells had a  left shift.  She did not have a positive culture from that, but she was  on the oral antibiotics.  She had had the previous cortisone  instillation at the time of surgery and she was having worsening pain.  All this data really was not absolute for an infection, but certainly  relative indication of an infection.  The day after the aspiration  she  was worse than before the aspiration.  At that point we felt that we  just could not longer accept the possible risk that she was infected,  even though we did not have a positive culture, relative to the fact  that she had exposed bone and the possibility of osteomyelitis, which  would ultimately be a relative contra-indication for total knee  replacement, so we felt that we needed to take her back and wash her  out.  I discussed all of this with her and I think she understood the  issues and wanted to come to surgery, and that is what we thought was  the best thing for her.  She was brought to the operating room for this  procedure.   PROCEDURE IN DETAIL:  The patient was brought to the operating room.  After adequate anesthesia was obtained with general anesthetic, the  patient was placed prone on the operating table.  The  right leg was  prepped and draped in the usual sterile fashion.  Following this, the  knee was aspirated and about 60 mL of sort of a milky kind of fluid was  taken out of the knee and this was sent to the lab for Gram stain,  culture, a cell count.   At this point the old portals were opened, and the arthroscopic cannulas  were then placed.  The knee was copiously and thoroughly lavaged with 9  L of normal saline irrigation.  She did have a tremendous amount of sort  of friable synovium, sort of hyperactive synovium, and it certainly  appeared that this would be consistent with an infection, this friable  tissue, so we did a thorough and extensive synovectomy, medial  compartment, lateral compartment, patellar compartment, and really  debrided all of this tissue out as well as thoroughly irrigating, and  following that we put a cannula in and instilled in some fluid with bug  juice in it and once that was completed, we put a medium Hemovac drain  in through a separate portal on the lateral side and sewed the drain in.  The knee was copiously and thoroughly  irrigated at that point, and  suctioned dry.  The arthroscopic portals were closed on that medial side  where she had that persistent draining portal.  We put 2 interrupted  vertical mattress nylon sutures to really get some eversion and try to  get that skin closed, and on the lateral side, we left that portal open  and then she has the portal where the drain was.   We will be admitting her to the hospital.  I discussed the case with Darlina Sicilian, who is an infectious disease specialist.  He felt that we were  following the appropriate course of action and he wants to review some  of her data and will give some recommendations for antibiotics.  His  initial recommendation was 1 g of vancomycin q.12, and this was started  after the cultures were taken intraoperatively.  The patient had sterile  compressive dressings, was taken to recovery, where she noted to be in  satisfactory condition.      Harvie Junior, M.D.  Electronically Signed     JLG/MEDQ  D:  06/03/2007  T:  06/04/2007  Job:  [id number]

## 2010-09-16 NOTE — Assessment & Plan Note (Signed)
REASON FOR VISIT:  Follow up chronic postoperative knee pain, as well as  hip and back pain.   HISTORY:  [name] returns today.  She has a history of postoperative  infection in the right knee, treated with IV antibiotics.  Now her PICC  line is out and she is on p.o. Levaquin.  Overall out of the wheelchair  on the walker once again.  Employed 20 hours per week now.  Her average  pain is in the 3-6/10 range, currently 5, activity at an 8 and it is  worse with walking, bending, standing in the right knee.  She is not  climbing steps.  She needs some assistance with shopping and household  duties.   She is following up with Dr. Lequita Halt for possible total knee.  Also  getting another opinion at [facility] from both department of infection as  well as the department of orthopedic surgery.   REVIEW OF SYSTEMS:  Positive for tremor.  Trouble walking, depression,  constipation and poor appetite.   CURRENT MEDICATIONS:  1. Duragesic patch, 850 mcg patch plus a 12 mcg patch, total 62 mcg.      This is better tolerated in terms of drowsiness than the 75.  2. She was started on Cymbalta 30 mg per day for a week, #60.      Insurance has denied thus far but getting prior failure of Prozac.   Still with some right hip pain, somewhat improved on Motrin 800 3 times  a day.   PHYSICAL EXAMINATION:  VITAL SIGNS:  The blood pressure was 131/64,  pulse 98, respirations 18, O2 sat 96% on room air.  GENERAL:  An obese female in no acute distress.  EXTREMITIES:  Without edema.  She has no knee effusion.  She has no  hypersensitivity to touch over the right knee incision.  The strength is  4/5 in the right hip flexion, knee extensors, 5/5 in the ankle  dorsiflexion and plantar flexion on the right side and 5/5 in the left  side.  She has tenderness over the right greater trochanter, greater on  the left side.   IMPRESSION:  1. Chronic postoperative knee pain.  She has narcotic analgesic      benefits  without negative adverse affects at the current doses.      She does not some skin irritation, sometimes trouble with sticking      needing supplemental tape via adhesive tape, but overall      functioning better.  2. Right hip trochanteric bursitis.  At this point, would be a bit      leery injecting her given that she still is on antibiotics for her      knee infection and she is not in favor of any type of injections      herself.   PLAN:  1. Continue current medications.  2. Will give her some samples of Cymbalta today.  3. Continue current Duragesic dosage.  4. She will see me in about two months.  5. Nursing visit in one month for count and monitoring of any      irritation.      Erick Colace, M.D.  Electronically Signed     AEK/MedQ  D:  09/06/2007 09:43:30  T:  09/06/2007 10:19:04  Job #:  [id number]   cc:   Harvie Junior, M.D.  Fax: [phone]   Acey Lav, MD  Fax: [phone]

## 2010-09-16 NOTE — Group Therapy Note (Signed)
HISTORY OF PRESENT ILLNESS:  Consult requested by Dr. Alveda Reasons in regards  to back pain and chronic pain management. She has a history of lumbar  degenerative scoliosis and lumbar spondylosis. She has also had problems  with right knee pain. She has had about 4 year history of back pain. She  was treated by Dr. Cynda Familia. 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 Last MRI of the lumbar  spine was April 05, 2005 showing multi-level facet arthropathy, disk  bulges protrusion L1-2, L3-4, synovial cyst L4-5 and seen central  stenosis L4-5 level multi-factorial.   PAST MEDICAL HISTORY:  Significant for smoking, asthma. She has 1  admission June 14, 2003 for bronchitis. Past history of depression  and low back pain. Also diabetes and hypertension.   She indicates that her main functional deficits are short walking time,  i.e. about 5 minutes. She can drive. She does not use any assistive  device. The pain is a 7 to 8 out of 10 and interferes with enjoyment of  life at that level.   REVIEW OF SYSTEMS:  Positive for walking, depression and weakness.   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   SOCIAL HISTORY:  Single. Lives with herself.   FAMILY HISTORY:  Heart disease, high blood pressure, psychiatric  problems, alcohol abuse, and disability.   MEDICATIONS:  Motrin 800 mg once daily and 600 mg twice a day,  Lisinopril once daily, Lasix 80 mg once daily, Metformin 500 b.i.d.,  Vytorin 10/80 once daily, Lunesta 3 mg daily, Premarin 0.625.   PHYSICAL EXAMINATION:  VITAL SIGNS:  Blood pressure 147/79, pulse 108,  respiratory rate 18, O2 saturation 95% on room air.  GENERAL:  No acute distress. Mood and affect appropriate.  BACK:  No tenderness to palpation except at the lumbosacral  junction.  She has pain with extension, greater than with flexion. She has good  forward flexion abilities.  EXTREMITIES:  She has good strength in upper and lower extremities. Gait  is normal. Deep tendon reflexes are normal. Sensation is normal. She has  knee pain with attempts at Taber maneuver. No hip pain. She has some  crepitus in bilateral knees.   IMPRESSION:  Lumbar stenosis with facet arthropathy, as well as some  disk degeneration. She has mainly axial pain. She has had injections per  Dr. Murray Hodgkins. She will get me some records on that. I am not clear whether  they were epidural's or facet's. She thinks they may have been both.   In terms of pain management, I do think she would be appropriate for  narcotic analgesic medications and will check the urine drug screen and  once that clears in terms of no illicit drugs or non-disclosed opiates,  would be able to take  over that prescription. In the meantime, will write some Tramadol 100 mg  t.i.d. and Neurontin, starting at a low dose 100 mg q.h.s. x3 days and  go to b.i.d. every 3 days. Go up by 1 tablet, up to q.i.d. I will see  her back in 1 to 2 weeks.      Erick Colace, M.D.  Electronically Signed     AEK/MedQ  D:  02/18/2007 16:58:18  T:  02/20/2007 13:58:24  Job #:  [id number]   cc:   Marvis Repress.  Dorothe Pea, M.D.  Fax: [phone]

## 2010-09-16 NOTE — Assessment & Plan Note (Signed)
A 55 year old female with lumbar spinal stenosis, lumbar degenerative  disk, chronic postoperative pain following knee replacement.  She took  herself off Cymbalta.  She did not think that was doing her any good.  She has been off about a week.  She was up to 60 mg.  Denies any  suicidal thoughts.  She feels just overall down because of chronic pain.  She has never seen a pain psychologist.   She has walked 5-10 minutes at a clock time.  She does not climb steps.  She does drive.  She is no longer employed.  She needs some help with  household duties, shopping.   Review of systems positive for weakness in the right knee area, trouble  walking, depression, constipation, night sweats.   Her last spine procedure was a radiofrequency procedure done in  December.  She had good relief at the medial branch blocks x2, but did  not have the same type of relief with her radiofrequency procedure.   She will see Dr. Sherlean Foot next week.  She is getting some PT for her right  knee replacement.  She would like to get some therapy for her back which  I think would be reasonable given that she is only partially responsive  to medications as well as injections.   I will go ahead and see if Dr. Sherlean Foot can order that since it is through  the [facility].   Average pain is 6/10.   PHYSICAL EXAMINATION:  VITAL SIGNS:  Her blood pressure 128/59, pulse  94, respirations 18, O2 sat 95% on room air.  GENERAL:  No acute distress.  Orientation x3.  Affect alert.  Gait, she  is using a cane.   Ambulation decreased weightbearing right knee.  She has good strength  bilateral lower extremities at the ankles.  However, at the knee she has  4/5 on the right and 5/5 on the left due to pain.   Her back has mild tenderness to palpation along the lumbar paraspinals  at the lumbosacral junction.  Her range of motion is about 50% forward  flexion and extension.  Hip range of motion is okay.  Sensation normal  in lower extremities.   IMPRESSION:  1. Lumbar spondylosis without myelopathy.  2. Chronic postoperative pain, history of right knee infection, status      post total hip arthroplasty undergoing physical therapy.  She will      follow up with Dr. Sherlean Foot in regards to her knee.  I will see her      back in another 2 months or have nursing see her in 1 month.  I      will send her to Dr. Rosalin Hawking pain psychologist in [place] for      depression and coping related to chronic pain.  3. We will trial some nortriptyline 10 mg nightly may need to slowly      build up over time.      Erick Colace, M.D.  Electronically Signed     AEK/MedQ  D:  07/20/2008 16:36:29  T:  07/21/2008 05:03:06  Job #:  [id number]   cc:   Mila Homer. Sherlean Foot, M.D.  Fax: [phone]

## 2010-09-16 NOTE — Assessment & Plan Note (Signed)
[name] is a 55 year old female who I last saw on Sep 06, 2007.  She  has been maintained on Duragesic patch 62 mcg q.72 h. along with  Cymbalta 60 mg daily.  In the interval time period, she has undergone a  right total knee replacement per Dr. Ollen Gross.  Postoperatively,  she is continued on her Duragesic patch at current dose and on top of  that has had Percocet for breakthrough pain.  She went to an assisted living postoperatively and now is back home.  She has recently had clearance to drive short distances.  She is not  working yet.  She is starting some outpatient physical therapy soon.  Her pain level is 3-4/10 which is reduced from 5/10 last visit.  Her  pain interference scores are down from 8/10 to 1-3/10.  She is noticing  her back pain now that her knee pain is feeling a bit better.  She  requests to reduce her pain medications.   REVIEW OF SYSTEMS:  Positive for depression and anxiety.   PHYSICAL EXAMINATION:  VITAL SIGNS:  Blood pressure is 144/61, pulse 82,  respirations 20, and O2 sat 97% on room air.  GENERAL:  An overweight female in no acute distress.  Orientation x3.  Affect is alert.  EXTREMITIES:  Some effusion in right knee but well-healed scar.  She has  no pedal edema or pretibial.   Her ankle range of motion, she goes from full extension to 90 degrees  flexion on the right, and on the left, she has full range.  She has good  ankle range of motion in the right side.  She has normal pulses  bilaterally in the lower extremities.  She has no hypersensitivity to  touch over the scar or over the lower extremity.  BACK:  Her back has some tenderness to palpation at the lumbosacral  junction.  Lumbar spine range of motion is 50% forward flexion and  extension.  Extension is more painful than flexion.   IMPRESSION:  1. Chronic postoperative knee pain.  This is overall improving, status      post total knee replacement.  2. Lumbar stenosis with facet  arthropathy.   PLAN:  1. We will reduce her Duragesic patch from 62 mcg to 50 mcg x2 weeks      and then down to 25 mcg x2 weeks.  2. I will see her back in 4 weeks.   In regards to her back, we may consider lumbar spine injection such as  facet versus epidurals.  May need repeat MRI.   We will continue with Cymbalta 60 mg a day and discontinue alprazolam.      Erick Colace, M.D.  Electronically Signed     AEK/MedQ  D:  12/19/2007 15:22:37  T:  12/20/2007 05:00:00  Job #:  [id number]   cc:   Ollen Gross, M.D.  Fax: [phone]   Acey Lav, MD  Fax: [phone]   Harvie Junior, M.D.  Fax: [phone]   Jethro Bastos, M.D.  Fax: [phone]

## 2010-09-16 NOTE — Discharge Summary (Signed)
NAMEALILA, SOTERO                   ACCOUNT NO.:  [id number]   MEDICAL RECORD NO.:  [id number]           PATIENT TYPE:   LOCATION:                                 FACILITY:   PHYSICIAN:  Harvie Junior, M.D.   DATE OF BIRTH:  Feb 03, 1956   DATE OF ADMISSION:  06/29/2007  DATE OF DISCHARGE:  07/02/2007                               DISCHARGE SUMMARY   ADDENDUM   HISTORY OF PRESENT ILLNESS:  [name] is a 55 year old female who had a  prior discharge summary dictated on June 29, 2007.  At that point of  time, it was felt that she was in need of skilled nursing facility, as  she did not have anyone to care for her at home.  This was direction of  disposition for her.  Ultimately, she still had complaints of terrible  pain, not controlled on pills.  In the time between June 29, 2007  and July 02, 2007, her ultimate discharge date, she was converted  from subcu Lovenox for DVT prophylaxis to p.o. Coumadin.  Then,  ultimately she was able to get someone to care for her at her home.  She, overall, was quite unhappy with her situation, but was manageable  with this.  On July 01, 2007, she was noted to be afebrile.  Her  right knee wound was benign.  Her knees had good amount of warmth.  Her  INR was 1.1, on Coumadin.  Her PCA morphine panel which she had through  her entire hospitalization was discontinued on this date.  On July 02, 2007, she stated she was ready to go home.  She was taking fluids  and voiding without difficulty.  She was ambulating in the room.  She  quoted do I have to take the staples out of my knee or can I leave  them?  Ultimately, at this point, her right knee wound is benign.  She  had no swelling, no redness, but did have pain with range of motion of  her right knee.  Her calf was soft and nontender.  Her INR, on this  date, was 1.5, on Coumadin therapy.  It is of note that her C-reactive  protein, on June 30, 2007, was 2.3.  Sed rate on  June 30, 2007,  was 49.  She was ultimately discharged home, as she did have ability for  care at home.   ADDITIONAL DIAGNOSES:  1. Chronic pain syndrome with hypertension for narcotics abuse.  2. Psychosocial liability/instability with anxiety.   She is discharged home on July 02, 2007, in improved condition.  She  is on a diabetic diet.  Her staples were removed prior to her discharge,  and her right knee wound was re-dressed.  She was instructed to ambulate  weightbearing as tolerated in the right leg with crutches or a walker.  She will need home health physical therapy 3 times a week and her home  CPM 8 hours for 24 hours for a right knee range of motion.   She was given prescription for:  1.  OxyContin 40 mg #30 one b.i.d.  2. Percocet 5 mg #60 one to two q.6 h. p.r.n. breakthrough pain.  3. Ativan 1 mg t.i.d. #50 p.r.n. anxiety.  4. IV Rocephin 2 g daily x6 weeks.   She is instructed not to smoke, as this will decrease the healing  potential of her right knee.   FOLLOWUP:  1. She is to follow up with Dr. Luiz Blare in 10 days.  2. Follow up with Dr. Daiva Eves of infectious disease in 1 month.  3. Follow up with Dr. Wynn Banker of pain management in 2 weeks,  4. Followup with Dr. Dorothe Pea, her primary care physician in 2-3 weeks,      to follow up her diabetes.  5. She is given prescription for Coumadin x6 weeks, postop, for DVT      prophylaxis as per home health.  All other medications are as per      her previous discharge summary.      Marshia Ly, P.A.      Harvie Junior, M.D.  Electronically Signed    JB/MEDQ  D:  09/07/2007  T:  09/08/2007  Job:  [id number]

## 2010-09-16 NOTE — Procedures (Signed)
[name], [name]                   ACCOUNT NO.:  [id number]   MEDICAL RECORD NO.:  [id number]         PATIENT TYPE:  [facility]P   LOCATION:                                 FACILITY:   PHYSICIAN:  Erick Colace, M.D.DATE OF BIRTH:  [birth year]   DATE OF PROCEDURE:  03/15/2008  DATE OF DISCHARGE:                               OPERATIVE REPORT   PROCEDURE:  Bilateral L5 dorsal ramus injection, bilateral L4 medial  branch block, and bilateral L3 medial branch block under lumbar and  sacral fluoroscopic guidance.   INDICATIONS:  Lumbosacral degenerative disk with facet syndrome.   Informed consent was obtained after describing risks and benefits of the  procedure to the patient.  These include bleeding, bruising, infection,  loss of bowel or bladder function, and temporary or permanent paralysis.  She elected to proceed and has given written consent.  Her pain does  persists despite narcotic analgesic medications and other conservative  care and does interfere with self-care mobility.   She has had good 50% relief lasting about a couple of weeks after the  last injection performed 1 month ago.   The patient was placed prone on fluoroscopy table.  Betadine prep.  Sterile drape.  A 25-gauge 1-1/2-inch needle was used to anesthetize the  skin and subcutaneous tissue with 1% lidocaine x1.5 mL at each of 6  sites.  Then, a 22-gauge, 3-1/2-inch spinal needle was inserted, first  starting left S1 SAP sacroiliac junction, bone contact made, and  confirmed with lateral imaging.  Omnipaque 180 under live fluoro  demonstrated no intravascular uptake, then 0.5 mL of solution containing  1 mL of 4 mg/cc dexamethasone and 4 mL of 2% lidocaine were injected,  then left L5 SAP transverse process junction targeted, bone contact  made, and confirmed with lateral imaging.  Omnipaque 180 under live  fluoro demonstrated no intravascular uptake, then 0.5 mL of  dexamethasone lidocaine solution was  injected, then the left L4 SAP  transverse process junction targeted, bone contact made, and confirmed  with lateral imaging.  Omnipaque 180 x 0.5 mL demonstrated no  intravascular uptake, then 0.5 mL of dexamethasone lidocaine solution  was injected, and the same procedure was repeated on the right side at  corresponding levels using same technique, needle, and medications.  The  patient tolerated the procedure well.  Pre and post injection vitals  stable.  Post injection instructions given.  Pre-injection pain level is  3-4 and post-injection 2.  We will set her up for radiofrequency  neurotomy in the right side in 2-3 weeks.      Erick Colace, M.D.  Electronically Signed     AEK/MEDQ  D:  03/15/2008 14:20:11  T:  03/16/2008 01:59:35  Job:  [id number]

## 2010-09-16 NOTE — Discharge Summary (Signed)
[name], [name]                   ACCOUNT NO.:  [id number]   MEDICAL RECORD NO.:  [id number]          PATIENT TYPE:  INP   LOCATION:  5012                         FACILITY:  [facility]   PHYSICIAN:  Harvie Junior, M.D.   DATE OF BIRTH:  1955/09/23   DATE OF ADMISSION:  06/17/2007  DATE OF DISCHARGE:                               DISCHARGE SUMMARY   ADMISSION DIAGNOSES:  1. Septic arthritis with strep viridans.  Septic arthritis right knee.  2. End-stage degenerative joint disease right knee.  3. Chronic pain syndrome.  4. Hypertension.  5. Dyslipidemia.  6. Chronic obstructive pulmonary disease secondary to remote cigarette      use.  7. Current tobacco dependence.  8. Obesity.  9. Depression.  10.Normocytic Anemia.  11.Status post reduction mammoplasty 1977.  12.Status post TMJ surgery 1980.  13.Status post hysterectomy 1993.   DISCHARGE DIAGNOSES:  1. Septic arthritis with strep viridans.  Septic arthritis right knee.  2. End-stage degenerative joint disease right knee.  3. Chronic pain syndrome.  4. Hypertension.  5. Dyslipidemia.  6. Chronic obstructive pulmonary disease secondary to remote cigarette      use.  7. Current tobacco dependence.  8. Obesity.  9. Depression.  10.Normocytic Anemia.  11.Status post reduction mammoplasty 1977.  12.Status post TMJ surgery 1980.  13.Status post hysterectomy 1993.  14.Rash on buttocks region secondary to Candida.  15.Intractable pain with pain management difficulty.   CONSULTATION:  Infectious Disease, [place]iffton Asters, June 18, 2007.   BRIEF HISTORY:  Ms. Lemire is a 55 year old female who is status post  right knee arthroscopy for degenerative arthritis done in January.  She  postoperatively had developed third day of strep infection and was  rehospitalized for an arthroscopic I&D and lavage of the knee on June 03, 2007.  Drains were placed at that time, and she was ultimately  discharged to the hospital on IV Rocephin  and seemed to be making good  progress but again had recurrent pain and swelling in her knee with  minimal fevers overall.  We followed her in the office and got  peripheral WBC count which began elevating.  Her sed rate began  elevating as well.  We did a joint aspiration in the office and her  white cells were elevated.  We discussed the case with Infectious  Disease consultants and they suggested adding rifampin and waiting when  it was appropriate, but given the persistent pain and after discussion  was some of her family members, one who is a Control and instrumentation engineer in [place], felt that admitting her back to the hospital and watching her out  was appropriate, and she was brought back to the hospital for this.  Preoperatively, MRI will be obtained.  She is admitted back to the  hospital for treatment of her right knee.   PERTINENT LABORATORY STUDIES:  MRI scan of the right knee on the day of  admission showed a large somewhat contained fluid collection on the  anterior aspect of the knee, submuscular.  There does not appear to be  any evidence of osteomyelitis or any other sort of abscess formation.  An indium scan nuclear medicine WBC scan on June 24, 2007, showed no  focal activity within the right femur or tibia to suggest osteomyelitis.  There was mild generalized diffuse asymmetry uptake of white blood cells  in the right femoral metathesis, most consistent with right knee septic  joint space.  Chest x-ray on June 03, 2007, was reviewed and showed  no acute disease.  A standing x-ray of the right knee on June 27, 2007, showed tricompartmental joint space narrowing with a small joint  effusion.  Venous Doppler of the right leg on June 22, 2007, showed  no evidence of DVT, superficial thrombosis or Baker's cyst bilaterally.  Sed rate on June 22, 2007, was 7.  Synovial fluid analysis on  June 22, 2007, showed no crystals seen.  WBCs were less than 200.  CRP  high sensitivity on June 23, 2007 was 45.  Sed rate on June 23, 2007, was 62.  C-reactive protein on June 27, 2007, was 2.7.  Sed rate on June 27, 2007, was 54.  Gram stain and cultures from  June 17, 2007, intraoperatively including tissue cultures, anaerobic  cultures, wound culture and fungal cultures were all negative.  AFB  culture and smear were negative on June 17, 2007.  The patient's  hemoglobin on admission on June 22, 2007, was 11.3 with hematocrit  33.5, WBC was 11.3.  This was followed on February 19, with WBCs of  11.1, on February 23, WBC 10.1.   HOSPITAL COURSE:  The patient was admitted to the operating room and  underwent MRI scan of the right knee and underwent surgery as well  described in Dr. Luiz Blare' operative report on June 17, 2007.  Postoperatively, Infectious Disease was consulted regarding antibiotics.  They suggested continuing on Rocephin IV.  Drains were placed in the  right knee at the time of surgery.  On postop day #1, she had severe  complaints of pain.  She was very unhappy with her situation.  Vital  signs were stable.  She was afebrile.  Her drainage was 150 mL at that  point.  She was continued on IV antibiotics and PCA morphine pump for  pain control along with oral pain medications as well as a fentanyl pain  patch 50 mcg q. 3 days.  On postop day #2, one of her drains had  apparently been pulled out, but the drainage was scant at that point,  and the remaining drain was removed without difficulty.  The patient  continued to complain of underlying right knee pain.  Infectious Disease  continued to see the patient during the hospitalization.  She had a low  grade fever of 99.6.  On June 19, 2007, her pain was under better  control.  All her cultures were negative.  She was felt to have a  stubborn viridans strep septic arthritis, and it was suggested to  continue on Rocephin.  Her dressing was changed.  Her knee  wound was  entirely benign.  She had just some mild swelling in the suprapatellar  region of the knee, and it was intact distally with a soft calf.  She  has a CPM machine ordered at 0 degrees and 30 degrees.  The patient  could only tolerated 0-15 degrees of motion because of complaints of  pain in her knee, and despite use of the PCA morphine pump.  Her vital  signs continued to  be stable.  She was afebrile.  Sed rate on June 20, 2007, was 44.  CRP was 7.7, hemoglobin was 10.8 and WBC 10.0.  She  had some difficulty with pain control issues.  Dr. Daiva Eves, Infectious  Disease doctor, suggested Rocephin 2 grams IV daily at this point.  Dr.  Luiz Blare was in touch with the patient's brother-in-law who is a trauma  surgeon in [facility] and ultimately spoke with the chief of  orthopedics at his brother-in-law's hospital about the case and nothing  additional was suggested other than possibly an indium scan to rule out  osteomyelitis.  Dr. Luiz Blare was in consultation with radiology staff at  the hospital regarding possible use of an MRI for assistance in this  case, but did not feel it would give additional good information.  On  June 23, 2007, she continued to have pain in the right lower  extremity, and she did not feel she was able to take care of herself.  She had a T-max of 99.50.  Lungs were clear.  Her wound was clean and  dry over her right leg.  She did not have recurrent fusion.  She was  started on OxyContin 40 mg b.i.d. and attempts were made to wean her  from the PCA morphine pump.  She continued to complain that if she uses  CPM machine, she needed the morphine.  The patient also was noted to  continue to smoke through the hospitalization and was apparently seen  off the floor smoking and was counseled about this.  Infectious Disease  suggested continue the Rocephin which was maintained through this entire  hospitalization.  She seemed to have some improvement of her  symptoms  but continued to complain of pain and disability.  The high-level on CRP  on June 24, 2007, was 45.9, sed rate was 52.  It was noted that the  patient refused out of bed activities with occupational therapy on  June 24, 2007.  She received a wheelchair.  She did have a bit of a  rash on her buttocks and the skin care nurse was consulted and suggested  Diflucan with Microguard powder application.  The indium scan was done  which was essentially negative for the most part regarding  osteomyelitis.  Social worker consult was done for probable transfer to  a skilled nursing facility as the patient did not feel she could care  for herself and did not have assistance close by to help her.  On  June 28, 2007, her vitals were stable.  She was afebrile.  Her right  knee dressing was clean and dry.  Her calf was soft.  A venous Doppler  had been done several days earlier because of some complaints of pain  down the leg, and this was negative for DVT.  It was also noted that she  was maintained on Lovenox 40 mg subcu daily for DVT prophylaxis.  Dr.  Daiva Eves from Infectious Disease saw the patient on June 28, 2007,  and suggested a total of Rocephin 2 grams IV times 8 weeks postop.  Suggested that the patient be seen by infectious disease for follow-up  after completion of the IV Rocephin and suggested a repeat MRI of the  right knee on the week of August 01, 2007.  On June 29, 2007, the  patient still complained of moderate knee pain.  She was off the floor  in the wheelchair with a friend, and was still  using the PCA morphine  pump for use within the CPM.  Vital signs were stable.  She was  afebrile.  She had no significant swelling in her right knee, but she  still had pain with any attempts at range of motion.  She had no redness  noted.  Her calf was soft and was intact distally.  She will continue on  PCA morphine pump while she is in hospital, and we are awaiting   placement in a skilled nursing facility.   CONDITION ON DISCHARGE:  Improved.   DISCHARGE INSTRUCTIONS:  1. Diet:  Regular diabetic diet.  2. Activity:  Status will be weightbearing as tolerated on the right      with a knee immobilizer.   MEDICATIONS AT DISCHARGE:  1. Colace 100 mg b.i.d.  2. Diflucan 100 mg one daily times 5 days.  3. Duragesic 50 mcg patch one q. 3 days.  4. Glucophage 500 mg p.o. b.i.d.  5. HCTZ 25 mg one daily.  6. K-Dur 20 mEq one p.o. daily.  7. Lasix 80 mg one p.o. daily.  8. OxyContin 40 mg one q.12 h.  9. Premarin 0.65 mg one daily.  10.Prinivil 20 mg one daily.  11.Rocephin 2 grams IV q.24 h times 6 weeks from date of discharge, 2      months from date of most recent I&D which was June 17, 2007.  12.Vytorin 10/80 mg one daily.  13.Benadryl 25 mg one p.o. q.6 h p.r.n. itching.  14.Percocet 5 mg 1-2 tablets q.4 h p.r.n. breakthrough pain.  15.Robaxin 500 mg one q.6 h p.r.n. spasm.   She will need daily physical therapy for walker ambulation,  weightbearing as tolerated on the right with a knee immobilizer and for  right knee range of motion as tolerated.  We are concerned about limited  range of motion of her  right knee secondary to her complaints of pain.  She will follow up with  Dr. Luiz Blare in his office in three weeks time.  She will follow up  infectious disease physicians in early April after she has completed her  IV Rocephin, and will need a repeat MRI scan of the right knee during  the last week of March.      Marshia Ly, P.A.      Harvie Junior, M.D.  Electronically Signed    JB/MEDQ  D:  06/29/2007  T:  06/30/2007  Job:  [id number]

## 2010-09-16 NOTE — Procedures (Signed)
[name], [name]                   ACCOUNT NO.:  [id number]   MEDICAL RECORD NO.:  [id number]          PATIENT TYPE:  REC   LOCATION:  TPC                          FACILITY:  [facility]   PHYSICIAN:  Erick Colace, M.D.DATE OF BIRTH:  [birth year]   DATE OF PROCEDURE:  04/12/2008  DATE OF DISCHARGE:                               OPERATIVE REPORT   PROCEDURES:  Left L5 dorsal ramus radiofrequency neurotomy, left L4  medial branch radiofrequency neurotomy, left L3 medial branch  radiofrequency neurotomy under lumbar and sacral fluoroscopic guidance.   Informed consent was obtained after describing risks and benefits of the  procedure with the patient.  These include bleeding, bruising, or  infection.  The potential benefits would include reduction of pain to  allow for better mobility and self-care.  Currently, she has limitations  due to her back pain and her pain is only partially relieved with  narcotic/analgesic medications.   The patient placed prone on fluoroscopy table.  Betadine prep, sterile  drape, 25-gauge inch and half needle was used to anesthetize the skin  and subcutaneous tissue with 1% lidocaine x2 mL at each of three sites.  Then, a 20-gauge 10-cm RF needle with a 10-mm curved active tip was  inserted under fluoroscopic guidance targeting the left S1 SAP sacral  ala junction.  Bone contact made.  Confirmed with lateral imaging.  Sensory stem at 50 Hz followed by motor stem at 2 Hz confirmed proper  needle location followed by injection of 1 mL of solution containing 1  mL of 4 mg/mL dexamethasone and 2 mL of 1% MPF lidocaine followed by  radiofrequency lesioning 70 degrees Celsius for 70 seconds in the left  L5 SAP transverse process junction targeted.  Bone contact made.  Confirmed with lateral imaging.  Sensory stem at 50 Hz followed by motor  stem at 2 Hz confirmed proper needle location followed by injection of 1  mL of dexamethasone and lidocaine solution and  radiofrequency lesioning  7 degrees Celsius for 70 seconds in the left L4 SAP transverse junction  targeted.  Bone contact made.  Confirmed with lateral imaging.  Sensory  stem at 50 Hz followed by motor stem at 2 Hz confirmed proper needle  location followed by injection of 1 mL of dexamethasone lidocaine  solution and radiofrequency lesioning 70 degrees Celsius for 70 seconds.  The patient tolerated procedure well.  Pre and post injection vitals  stable.  Post injection instructions given.      Erick Colace, M.D.  Electronically Signed     AEK/MEDQ  D:  04/12/2008 84:13:24  T:  04/13/2008 04:58:15  Job:  [id number]

## 2010-09-16 NOTE — Op Note (Signed)
NAMESINDEE, STUCKER                   ACCOUNT NO.:  [id number]   MEDICAL RECORD NO.:  [id number]          PATIENT TYPE:  OIB   LOCATION:  5012                         FACILITY:  [facility]   PHYSICIAN:  Harvie Junior, M.D.   DATE OF BIRTH:  [birth year]   DATE OF PROCEDURE:  06/17/2007  DATE OF DISCHARGE:                               OPERATIVE REPORT   PREOPERATIVE DIAGNOSIS:  Persistent infection of right knee, status post  knee scope with subsequent washout and drain placement.   POSTOPERATIVE DIAGNOSIS:  Persistent infection of right knee, status  post knee scope with subsequent washout and drain placement.   PRINCIPAL PROCEDURE:  1. Irrigation and debridement of right knee with significant amount of      debridement of a rind-like tissue, a significant thorough      debridement of the joint including this synovitis bone.   SURGEON:  Harvie Junior, M.D.   ANESTHESIA:  General.   BRIEF HISTORY:  Ms. Gavina is a 55 year old female with a long history of  having had a degenerative knee scope brought back in January.  She  really did not get much relief from the knee scope.  She had sort of  persistent drained portal on the medial side.  We covered it with some  antibiotics.  Ultimately the portal did close but she unfortunately  developed fluid which had a high number of white cells and ultimately a  culture which grew Streptococcus viridans.  We washed her out thoroughly  arthroscopically with placement of large bore drains and unfortunately,  she did not do well postoperatively and persisted with having pain,  although she was on appropriate antibiotics.  She just did not do well.  We had followed her in the office and ultimately had gotten a CBC  peripheral which had started to go up.  We had gotten a sed rate, which  started to go up and we aspirated her fluid which had an increasing  number of white cells in the fluid in the knee joint.  We talked with  the infectious disease  consultand and they ultimately felt that adding  Rifampin and waiting a week was appropriate but given the patient's  persistent pain and I had some discussions with her family members, we  felt that taking her back and washing her out was appropriate and she  was brought to the operating for that procedure.  Preoperatively an MRI  was obtained.  The MRI showed that there was a large, sort of somewhat  contained fluid collection on the anterior aspect of the knee  submuscular.  There did not appear to be any evidence of osteomyelitis  or other sort of abscess formation.  Once the MRI was obtained and we  found no other obvious sources, we felt that open debridement would be  appropriate given that there was also one little abscess in the lateral  vastus lateralis that we felt that we needed to get into.  She is  brought to the operating room for this procedure.   PROCEDURE:  The patient was  brought to the operating room.  After  adequate anesthesia was attained with general anesthetic, the patient  was placed supine on operating table.  The right leg was then prepped  and draped in usual sterile fashion.  After this, the leg was elevated  for three minutes and a blood pressure tourniquet insufflated to 350  mmHg.  Following this, an incision was made from four fingerbreadths  proximal to the patella down to the inferior pole of the patella.  The  subcutaneous tissue was dissected.  It was pristine.  The quad tendon  was then medial parapatellar arthrotomy was undertaken from about the  superior pole of the patella superiorly.  We encountered a lot of pus  and fluid at this point as well as some sort of some breakdown products.  It almost appeared as though there was sort of a rind medially and  laterally with a lot of synovitic type tissue and we basically took a  rongeur and excised all the synovitic type tissue from both the medial  and lateral gutters and up superiorly.  Once that was  completed, the  articular cartilage was evaluated and noted to look reasonable and there  was some areas of the cartilage which were scraped and then the knee was  copiously and thoroughly irrigated 6 liters via pulsatile lavage  irrigation, 6 liters via arthroscopic cannula to get down into the joint  line and back around posteriorly.  We palpated and attempted to aspirate  the lateral cyst which was present in the knee kind of like a lateral  synovial cyst.  I was not able to get any fluid out of that.  We  certainly compressed the cyst multiple times while we were washing the  knee out.  Once the knee had been copiously and thoroughly lavaged, two  wide bore Hemovac drains were placed in the knee and sutured in place  and then the medial parapatellar arthrotomy was closed with PDS  interrupted figure-of-eight sutures x 5 to try to minimize any deep  suture material.  The skin was closed with a 2-0 Vicryls and then skin  staples.  A sterile compressive dressing was then applied as well as  knee immobilizer.  The patient taken to recovery room where she was  noted to be in satisfactory condition.  Estimated blood loss for the  procedure was less than 50 mL.  Of note, the tourniquet was let down  about midway through the procedure and there was not a tremendous amount  of bleeding from within the knee.      Harvie Junior, M.D.  Electronically Signed     JLG/MEDQ  D:  06/17/2007  T:  06/20/2007  Job:  [id number]

## 2010-09-16 NOTE — Discharge Summary (Signed)
[name], [name]                   ACCOUNT NO.:  [id number]   MEDICAL RECORD NO.:  [id number]          PATIENT TYPE:  INP   LOCATION:  1612                         FACILITY:  [facility]   PHYSICIAN:  Ollen Gross, M.D.    DATE OF BIRTH:  [birth year]   DATE OF ADMISSION:  11/21/2007  DATE OF DISCHARGE:  11/25/2007                               DISCHARGE SUMMARY   ADMITTING DIAGNOSES:  1. Osteoarthritis right knee.  2. Anxiety.  3. History of depression.  4. History of bronchitis.  5. History of pneumonia.  6. Hypertension.  7. Hypercholesterolemia.  8. Diet-controlled diabetes mellitus.  9. Degenerative disk disease.  10.Prior history of strep infection right knee.  11.Postmenopausal.  12.Childhood illnesses of measles and mumps.   DISCHARGE DIAGNOSES:  1. Osteoarthritis bilateral knees, status post right total knee      arthroplasty with left knee cortisone injection.  2. Mild postop hypokalemia improved.  3. Anxiety.  4. History of depression.  5. History of bronchitis.  6. History of pneumonia.  7. Hypertension.  8. Hypercholesterolemia.  9. Diet-controlled diabetes mellitus.  10.Degenerative disk disease.  11.Prior history of strep infection right knee.  12.Postmenopausal.  13.Childhood illnesses of measles and mumps.   PROCEDURE:  November 21, 2007 right total knee arthroplasty with left knee  cortisone injection.  Surgeon Dr. Lequita Halt, assistant Jacqueline Girt PA-C.   ANESTHESIA:  General.   CONSULTS:  None.   BRIEF HISTORY:  [name] is a 55 year old female with a long complicated  history in regard to her right knee.  She had a right knee arthroscopy  which was complicated postoperatively by infection.  She had a repeat  arthroscopy and arthrotomy and ended up having IV antibiotics for about  6 weeks, very painful stiff knee with progressive worsening arthritis,  clinically.  It appears that her infection has been eradicated.  C-  reactive protein, white cell count recent were  all normal.  She has  unfortunately gone on to end-stage arthritis and felt to be a candidate  for total knee.   LABORATORY DATA:  Preop CBC showed hemoglobin of 14.6, hematocrit of  42.7, white cell count 9.1, platelets 271,000.  Chem panel on admission  showed a slightly low potassium at 3.2.  Remaining Chem panel within  normal limits.  PT/INR 12.8 and 1 with a PTT of 30.  Preop UA negative.  Serial CBCs were followed.  Hemoglobin dropped down 11.5 then 11.4.  Last H&H 10.9 and 32.1.  Serial Pro Time followed.  PT/INR 21.1 and 1.7.  Chem panel, serial BMET on admission, potassium dropped further down to  3 back up to 3.7.  Remaining electrolytes remained within normal limits.  Stat Gram stain taken in the OR no organisms seen.  Wound culture taken  in the OR smear showed no organisms and no growth.  Anaerobe culture, no  organisms seen on the smear no anaerobes isolated.   X-RAYS:  June 03, 2007 chest x-ray:  No acute disease.  Follow-up  chest x-ray June 06, 2007:  PICC line at the at and heart size  normal.  EKG May 16, 2007:  Normal sinus rhythm, ST abnormality,  possible digitalis effect less prominent since previous compared to December 01, 2001 performed by Dr. Olga Millers.   HOSPITAL COURSE:  The patient was admitted to [facility] and  tolerated procedure well, later transferred to recovery and the  orthopedic floor started on PCA and p.o. analgesic pain control  following surgery, had moderate pain on the night of surgery and through  the next morning.  Kept her PCA.  Started therapy.  It was of note that  she had a significant amount of scar tissue from her previous surgeries  and infections, which was excised at the time of surgery, encouraged  range of motion due to the fact it was felt she would probably a higher  risk of arthrosis postoperatively.  Resumed her home meds.  Blood  pressure was a little low so we held her lisinopril, started her back  on  her other home meds.  Followed CBG.  CBG has remained pretty good  postoperatively.  By day 2, she is doing a little bit better but still  requiring IV narcotics.  She was only slowly progressing getting up  walking about 5 or 10 feet.  Due to her social situation it was felt she  was going to benefit from a skilled nurse facility.  We got discharge  planning involved to locate a bed.  She had already spoken to [facility]  and that was in the works.  Dressing changed on day 2, incision looked  good.  Continue the PCA.  By day 3, though she was up walking about 60  feet.  Pain was under a little bit better control.  Started weaning over  to p.o. meds.  Her CBG doing well again around 112-130.  No signs of  infection.  Continued to progress with therapy and by Friday November 25, 2007 the patient was doing well.  Pain was under better control,  tolerating p.o. meds and was discharged out to [facility].   DISCHARGE/PLAN:  The patient transferred to the [facility] nursing  facility.   DISCHARGE MEDS:  1. Coumadin protocol.  Please titrate Coumadin level for target INR      between 2 and 3.  She needs to be on Coumadin for 3 weeks from the      date of surgery of November 21, 2007.  2. Colace 100 mg p.o. b.i.d.  3. Lasix 40 mg p.o. daily.  4. Cymbalta 6 mg daily.  5. Lisinopril 20 mg daily.  6. Hydrochlorothiazide 25 mg daily.  7. Vytorin 10/80 p.o. nightly.  8. She is on a Duragesic patch 12 mcg per hour transdermal, remove and      change patch every 72 hours.  9. She also is actually on 2 Duragesic patches.  The other is a 50 mcg      per hour transdermal, removed change every 72 hours.  10.Percocet 5 mg 1 or 2 every 4 hours as needed for pain.  11.Tylenol 325 one or two every 4 to 6 hours as needed for mild pain      and per headache.  12.Robaxin 500 mg p.o. q.6 to 8 hours p.r.n. spasm.  13.She also takes Lunesta 3 mg p.o. nightly p.r.n. sleep.  14.Ativan 0.5 mg p.o. q.6 to 8 hours  p.r.n.   DIET:  Medium modified carbohydrate diet.   FOLLOWUP:  She needs to follow up with Dr. Lequita Halt next  Friday on July  31.  Please contact the office at [phone] to help arrange appointment  time and transfer of the patient.   ACTIVITY:  She is weightbearing as tolerated to the right lower  extremity.  Continue gait training ambulation, ADLs, PT/OT, total knee  protocol.  She will need a CPM.  Please arrange for a continued passive  motion machine.  She needs to be on the machine 2 hour sessions at least  3 sessions a day for a minimum of 6 hours a day.  Do not keep her in 6  hours straight.  She needs to be in 2 hour sessions about 3 times a day.  She may start showering.  However, do not submerge the incision under  water.   DISPOSITION:  Medical illustrator.   CONDITION ON DISCHARGE:  Improving.      Alexzandrew L. Perkins, P.A.C.      Ollen Gross, M.D.  Electronically Signed    ALP/MEDQ  D:  11/25/2007  T:  11/25/2007  Job:  [id number]   cc:   Jethro Bastos, M.D.  Fax: [phone]   [place]ann [place]

## 2010-09-16 NOTE — Procedures (Signed)
[name], [name]                   ACCOUNT NO.:  [id number]   MEDICAL RECORD NO.:  [id number]         PATIENT TYPE:  HREC   LOCATION:                                 FACILITY:   PHYSICIAN:  Erick Colace, M.D.DATE OF BIRTH:  January 24, 1956   DATE OF PROCEDURE:  01/26/2008  DATE OF DISCHARGE:                               OPERATIVE REPORT   PROCEDURE:  Bilateral sacroiliac injection under fluoroscopic guidance.   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   Informed consent obtained after describing risks and benefits of the  procedure with the patient.  These include bleeding, bruising and  infection.  She elects proceed and has given written consent.   The patient placed prone on fluoroscopy table, Betadine prep, sterile  drape.  A 25-gauge 1-1/2-inch needle was used to anesthetize the subcu  tissue with 1% lidocaine x2 mL.  Then, a 25-gauge 3-inch spinal needle  was inserted under fluoroscopic guidance first starting the left SI  joint, AP, lateral, and oblique images utilized.  Omnipaque 180 x 0.5 mL  demonstrated good joint outline followed by injection of 0.5 mL 40 mg/mL  Depo-Medrol and 1 mL of 2% MPF lidocaine.  The same procedure was  repeated on the right side with same needle injectate and technique.  The patient tolerated the procedure well.  Post-injection instructions  given.  She will return in 3 weeks for repeat injection.      Erick Colace, M.D.  Electronically Signed     AEK/MEDQ  D:  01/26/2008 13:21:34  T:  01/27/2008 00:57:34  Job:  [id number]

## 2010-09-16 NOTE — Assessment & Plan Note (Signed)
REASON FOR VISIT:  Follow-up for chronic right knee pain as well as back  and hip pain.  Date of last visit is July 21, 2007.   HISTORY:  Ms. Wernette returns today.  I saw her after a prolonged absence  following a right arthroscopic knee surgery for a meniscal tear as well  as cartilage degeneration, lateral femoral compartment and  patellofemoral compartment.  She had postoperative infection that has  been treated with IV antibiotics.  In the interval time period she has  had MRI of the right knee.  There is only a small residual knee effusion  but with synovial enhancement.  Bone marrow edema and enhancement of the  distal femur and proximal tibia were noted.   Since I last saw her I have referred her to Dr. Trish Fountain.  She has  not followed up with rehabilitation psychology yet.  I did encourage her  to do this because this would be helpful given the anxiety and  depression that she has had associated with her decreased level of  activity.   We started her on Cymbalta 30 mg a day.  She really could not tell any  difference while on it for a couple weeks' time.  I did start her on a  fentanyl patch 35 mcg q.72 h.  However, she had excessive drowsiness  with this and went back to Duragesic 50 mcg.  She had a couple of those  patches at home and then after those ran out, she went back on her  OxyContin.  She denied taking the OxyContin with fentanyl together.   She is on hold from physical therapy, has had some flare-up of her low  back pain but plans to resume therapy soon.  Continues to use a walker  in the home environment.  Uses a wheelchair outside the home.   EXAMINATION:  MENTAL STATUS:  Mood and affect labile although less so  than prior.  Her knee has a seated position.  She has mild joint effusion in the  knee.  The knee strength is 4- in the knee extensor, 4 at the hip flexor  and full at ankle dorsiflexion and plantar flexion.   IMPRESSION:  1. Chronic postoperative  knee pain.  She has had only partial response      to narcotic analgesic medications and at higher dosages has had      negative adverse effects.  I did reinforce that on a chronic basis,      narcotic analgesics would only be able to reduce pain by about 30%.      She is in understanding of this.  2. Right hip trochanteric bursitis.   I was not able to elicit whether or not she had truly better pain relief  at the higher dose because she felt too drowsy to really make that  assessment.   PLAN:  1. We will start her on a Duragesic 50 mcg patch along with a 12 mcg      patch for a total of 62 mcg q.72 h.  If she gets excessive      drowsiness she can take off the 12 mcg patch and continue on the      50.  2. Restart Cymbalta 30 mg for a week and then go up to 60 for three      weeks, and that way we can make a more adequate assessment of      efficacy, using it for both pain relief  as well as anxiety and      depression.  3. I did refill her Ativan 1 mg and asked her to take one-half tablet      up to b.i.d., #30 for a one-month supply.  4. I have encouraged her to continue with her home exercise program,      straight leg raises, try flexing and extending the knee.  She will      resume some physical therapy.  I will see her back in one month.  5. Right hip trochanteric bursitis.  I have asked her to restart anti-      inflammatory, Motrin 800 mg t.i.d., for one      week with food and if this is not successful, consider other NSAID      versus corticosteroid injection.  Follow up next visit.      Erick Colace, M.D.  Electronically Signed     AEK/MedQ  D:  08/02/2007 13:08:49  T:  08/02/2007 13:27:35  Job #:  [id number]   cc:   Harvie Junior, M.D.  Fax: [phone]   Acey Lav, MD  Fax: [phone]

## 2010-09-16 NOTE — Assessment & Plan Note (Signed)
A 55 year old female with lumbar spinal stenosis, lumbar degenerative  disk.  She also has chronic postoperative pain following knee  replacement.  Her pain level is 6/10, interferes with activity at  moderate level, worse with walking, standing, but also hurts with  sitting.  Pain improves with rest and medication.  She walks 10-20  minutes at a time.  She does not climb steps.  She does drive, has some  difficulty with household duties, trouble walking, constipation.   Current meds include:  1. Fentanyl patch 50 mcg q.48 hours.  2. She is also taking some Motrin 800 mg b.i.d.   She sees Dr. Lequita Halt.   PHYSICAL EXAMINATION:  VITAL SIGNS:  Her blood pressure is 150/67, pulse  102, respirations 18, O2 sat 96% on room air.  GENERAL:  In no acute distress.  Orientation x3.  Affect is normal.  EXTREMITIES:  Gait is stiff legged in the right knee.  The right knee  range of motion however is good.  She has no effusion.  She has no lower  extremity swelling.  No erythema in either lower extremity.  She has  normal sensation in bilateral lower extremity.  Her back has some  tenderness to palpation in lumbar paraspinals.  Range of motion however  is full in forward flexion and 50% with extension.   IMPRESSION:  1. Lumbar spinal stenosis, lumbar degenerative disk.  2. Chronic postoperative pain, left lower extremity.   PLAN:  1. We will have her see nursing next month.  2. I will see her back in 2 months.  We will continue current      medications.      Erick Colace, M.D.  Electronically Signed     AEK/MedQ  D:  05/25/2008 17:40:44  T:  05/26/2008 08:58:12  Job #:  [id number]   cc:   Ollen Gross, M.D.  Fax: [phone]

## 2010-09-16 NOTE — Op Note (Signed)
[name], [name]                   ACCOUNT NO.:  [id number]   MEDICAL RECORD NO.:  [id number]          PATIENT TYPE:  INP   LOCATION:  0001                         [facility]ACILITY:  [facility]   PHYSICIAN:  Ollen Gross, M.D.    DATE OF BIRTH:  [birth year]   DATE OF PROCEDURE:  11/21/2007  DATE OF DISCHARGE:                               OPERATIVE REPORT   PREOPERATIVE DIAGNOSIS:  Osteoarthritis bilateral knees.   POSTOPERATIVE DIAGNOSIS:  Osteoarthritis bilateral knees   PROCEDURE:  1. Right total knee arthroplasty.  2. Left knee cortisone injection.   SURGEON:  Ollen Gross, M.D.   ASSISTANT:  Alexzandrew L. Perkins, P.A.-C.   ANESTHESIA:  General with postoperative Marcaine pain pump.   ESTIMATED BLOOD LOSS:  Minimal.   DRAINS:  Hemovac x1.   TOURNIQUET TIME:  Right lower extremity 67 minutes at 300 mmHg.   COMPLICATIONS:  None.   CONDITION:  Stable to recovery room.   CLINICAL NOTE:  Supriya 26 is a 55 year old female with a long complicated  history in regards to her right knee.  She had a right knee arthroscopy,  complicated by infection January 2.  She has a repeat arthroscopy and  arthrotomy.  She ended up having 6 weeks of IV antibiotics.  She ended  up with very stiff painful knee and progressively worsening arthritis.  Recently, her symptoms clinically have not demonstrated any infection.  She has not had any swelling.  Recent sed rate, C-reactive protein and  white blood cell count were all well within normal limits.  She had  attempted aspiration at one point which did not show fluid.  She  presents now for right total knee arthroplasty versus resection  arthroplasty depending on intraoperative gram stains and frozen  sections.   PROCEDURE IN DETAIL:  After successful administration of general  anesthetic, a tourniquet was placed high on the right thigh and right  lower extremity prepped and draped in the usual sterile fashion.  Preoperative range of motion was 5-45.   The extremity was wrapped in  Esmarch and tourniquet inflated to 300 mmHg.  Midline incision made with  a 10 blade through subcutaneous tissues to level the extensor mechanism.  A fresh blade was used make a medial parapatellar arthrotomy.  Minimal  fluid was encountered.  It was sent for stat Gram stain which shows no  organisms, rare white blood cells.  She had a tremendous amount of  scarring in the joint.  I had to recreate the medial and lateral gutters  around the femur with subperiosteal dissection, and I dissected the soft  tissue over the proximal medial tibia to the joint line with the knife  and into the semimembranosus bursa with a Cobb elevator.  Soft tissue  laterally was also elevated attention being paid to avoiding patellar  tendon on the tibial tubercle.  I took a specimen of synovium from the  suprapatellar area and one adjacent to the tibia as well as one adjacent  to the femur.  All 3 synovial specimens showed no acute inflammation.  I  everted the patella  and flexed the knee 90 degrees.  Remnants removed as  well as the PCL.  Drill was used create a starting hole in the distal  femur, and canal was thoroughly irrigated.  Marrow contents looked  normal.  The 5-degree right cutting guide was placed and referencing off  the posterior condyles rotations marked and the block pinned to remove  11 mm of the distal femur.  I took 11 because of the small flexion  contracture.  Distal femoral resection was made with an oscillating saw.  Sizing block placed; size 4 was most appropriate.  Rotations marked off  the epicondylar axis.  Size 4 cutting block was placed, and the  anterior, posterior and chamfer cuts were made.  The bone had a normal  appearance to it.   The tibia was subluxed forward and meniscal remnants were removed.  The  extramedullary tibial alignment guide was placed referencing proximally  at the medial aspect of the tibial tubercle and distally along the   second metatarsal axis of the tibial crest.  Both sides had  deficiencies, but the medial was more deficient.  We pinned the block at  a resection level of 2 mm off of the deficient medial side.  Tibial  resection was made with an oscillating saw.  Size 3 was the most  appropriate tibial component.  There was an arthritic cyst present in  the tibia.  We sent this content for stat frozen section, and it showed  no acute inflammation.  The proximal tibia was then prepared with a  modular drill and keel punch for the size 3.  Femoral preparation was  completed with the intercondylar cut with a size 4.   Size 3 mobile bearing tibial trial, size 4 posterior stabilized femoral  trial, and a 10-mm posterior stabilized rotating platform insert trial  were placed.  With the 10, she hyperextend a little bit and had a little  bit of anterior posterior play, so went to 12.5 which allowed for full  extension with excellent varus, valgus, anterior, posterior balance  throughout full range of motion.  Patella was then everted, and it was  measured to be 24 mm.  Free-hand resection was taken to 14 mm, 35  template was placed, lug holes were drilled, trial patella was placed  and it tracked normally.  We then did an extremely thorough synovectomy  throughout the entire joint.  Her flexion against gravity once we had  made the cut to put the trials in was about 115 degrees which was a  marked improvement compared to her preoperative preop motion.  I checked  again for any other adhesions and did not find any.  The trials were  removed except for the femoral trial and osteophytes removed off of the  posterior femur with the femoral trial in place.  That was then removed,  and the cut bone surfaces prepared with pulsatile lavage.  Cement was  mixed, 2 batches of cement and 2 g of vancomycin.  Once the cement was  ready for implantation, we had already prepared the cut bone surfaces  with pulsatile lavage,  and then the size 3 mobile bearing tibia, size 4  posterior stabilized femur and 35 patella were cemented into place, and  the patella was held with a clamp.  Trial 12.5-mm insert was placed,  knee held in full extension and all extruded cement removed.  When the  cement was fully hardened, the trials were removed and FloSeal injected  onto  the posterior capsule.  The permanent 12.5-mm posterior stabilized  rotating platform insert was placed into the tibial tray.  FloSeal was  then injected in the mediolateral gutters and suprapatellar area.  Moist  sponge was placed and tourniquet released at a total time of 67 minutes.  The tourniquet time was also indicative of the waiting time that we had  for the gram stain as well as the frozen section.  The sponges, needles,  and instruments were then removed.  Minimal bleeding encountered, and  that which was encountered was stopped with electrocautery.  Then  thoroughly irrigated the joint with about a liter and half of pulsatile  lavage saline and closed the arthrotomy over a Hemovac drain with  interrupted #1 PDS.  Flexion against gravity to 120 degrees.  Subcutaneous tissue was closed with interrupted 2-0 Vicryl and  subcuticular running 4-0 Monocryl.  Catheter for Marcaine pain pump was  placed and the pump initiated.  The Hemovac was then hooked to suction.  Steri-Strips and  bulky sterile dressing were applied.  On the left knee, I prepped with  Betadine and injected with 6 mL lidocaine, 6 mL Depo-Medrol and then  covered that with a Band-Aid.  Bulky sterile dressing was applied to the  right knee, and she was placed into a knee immobilizer, awakened, and  transported to recovery in stable condition.      Ollen Gross, M.D.  Electronically Signed     FA/MEDQ  D:  11/21/2007  T:  11/21/2007  Job:  [id number]   cc:   Harvie Junior, M.D.  Fax: [phone]

## 2010-09-16 NOTE — Procedures (Signed)
[name], [name]                   ACCOUNT NO.:  [id number]   MEDICAL RECORD NO.:  [id number]          PATIENT TYPE:  REC   LOCATION:  TPC                          FACILITY:  [facility]   PHYSICIAN:  Erick Colace, M.D.DATE OF BIRTH:  1956-03-06   DATE OF PROCEDURE:  02/16/2008  DATE OF DISCHARGE:                               OPERATIVE REPORT   PROCEDURE:  Bilateral L5 dorsal ramus injection, bilateral L4 medial  branch block, bilateral L3 medial branch block under fluoroscopic  guidance.   INDICATIONS:  Lumbar axial pain which was not adequate in the    Dictation ended at this point.      Erick Colace, M.D.  Electronically Signed     AEK/MEDQ  D:  02/16/2008 16:03:44  T:  02/17/2008 04:06:26  Job:  [id number]

## 2010-09-16 NOTE — Op Note (Signed)
NAMESALVATORE, [name]                   ACCOUNT NO.:  [id number]   MEDICAL RECORD NO.:  [id number]          PATIENT TYPE:  AMB   LOCATION:  DSC                          FACILITY:  [facility]   PHYSICIAN:  Harvie Junior, M.D.   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   PROCEDURE:  1. Medial and lateral meniscectomy with corresponding debridement in      the lateral and medial femoral compartments.  2. Chondroplasty, patellofemoral joint.   SURGEON:  Harvie Junior, M.D.   ASSISTANT:  Marshia Ly, P.A.   ANESTHESIA:  General.   BRIEF HISTORY:  [name] is a 55 year old female with a long history of  having had severe bilateral degenerative joint disease, right side  greater than left.  We treated her conservatively for a couple of years.  We talked about the possible need for knee replacement.  She really just  could not fit this into her schedule, but was hurting so bad she felt  like she needed some sort of temporary relief.  We felt that she  certainly had an unstable mechanical problem in that right knee and we  felt that we could make it better with arthroscopy.  She was brought to  the operating room for this procedure.   DESCRIPTION OF PROCEDURE:  Patient was brought to the operating room.  After adequate anesthesia was obtained with general anesthetic, patient  was placed upon the operating table.  The right leg was prepped and  draped in the usual sterile fashion.  Following this, routine  arthroscopic examination of the knee revealed there was an obvious  posterior horn medial meniscal tear.  This was debrided back to a smooth  and stable  rim.   Attention was turned to the medial femoral condyle, which really had no  flaking or cracking cartilage, but was exposed bone over about a 3.5 cm  area of the femur and about almost a 3 cm area on the tibia.   Attention was turned to the ACL, which was normal.   Attention was turned laterally, where there was, a little bit  unexpectedly, some grade 4 change over about a 2 cm area on the lateral  femoral condyle.  Lateral tibial plateau did not have tremendous  chondromalacia.  She did have a posterior horn lateral meniscal tear,  which was debrided, as well.   Once this was completed, attention was turned up into the patellofemoral  joint, where a chondroplasty was performed in the patellofemoral joint.   Once the patellofemoral joint had been debrided thoroughly, a check was  made for any loose and fragmented pieces.  Seeing none, the knee was  copiously and thoroughly irrigated and suctioned dry.  The portals were  closed with a stitch and 180 mg of Depo-Medrol was  instilled into the  knee for postoperative pain relief, as well as 20 mL of 0.25% Marcaine.  The patient had a sterile compressive dressing applied and was taken to  the recovery room, where she was noted to be in satisfactory condition.   ESTIMATED BLOOD LOSS:  Estimated blood loss for the procedure was none.      Harvie Junior, M.D.  Electronically Signed     JLG/MEDQ  D:  05/18/2007  T:  05/18/2007  Job:  [id number]

## 2010-09-19 NOTE — Consult Note (Signed)
NAMEJAMEAH, [name]                             ACCOUNT NO.:  [id number]   MEDICAL RECORD NO.:  [id number]                   PATIENT TYPE:  INP   LOCATION:  5022                                 FACILITY:  [facility]   PHYSICIAN:  Oley Balm. Sung Amabile, M.D. [facility]          DATE OF BIRTH:  August 13, 1955   DATE OF CONSULTATION:  06/15/2003  DATE OF DISCHARGE:                                   CONSULTATION   REASON FOR CONSULTATION:  Asthmatic bronchitis.   HISTORY OF PRESENT ILLNESS:  [name] is a 55 year old woman with a poorly-  defined prior pulmonary history, for which she has seen Dr. Maple Hudson on a  couple of occasions in the past.  She was a smoker until 6-8 months ago.  There was some question of sarcoidosis in the past.  She is not treated  chronically for any respiratory condition.  She presented on June 14, 2003, with a 24-hour history of cough, shortness of breath, fevers, and  chest discomfort.  The chest discomfort was attributed to the cough.  Her  cough is nonproductive, and she has had no hemoptysis.  She denies lower  extremity edema and calf tenderness.   PAST MEDICAL HISTORY:  Her medical regimen includes treatment for -  1. Hypertension.  2. Depression.  3. Hypercholesterolemia.  4. 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   PAST SURGICAL HISTORY:  1. Hysterectomy.  2. TMJ surgery.  3. Right knee surgery.  4. Breast reduction surgery.   SOCIAL HISTORY:  She has smoked up to a pack of cigarettes per day  throughout her adulthood, until approximately 6 or 8 months ago.  She did  quit for an extended period of time earlier in her life, as well.  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   FAMILY HISTORY:  Noncontributory.   REVIEW OF SYSTEMS:  As per the history of present illness.  Also, she denies  nausea, vomiting, diarrhea, and dysuria.   PHYSICAL EXAMINATION:  GENERAL:  She is well-developed,  well-nourished, in  no acute cardiac or respiratory distress.  When I arrived in her room, she  was sleeping with snoring respirations, but no witnessed apneas.  She  awakened easily to voice.  Mental status appears intact.  HEENT:  No acute abnormalities.  NECK:  Supple without adenopathy.  Jugular venous pulsations are not well  visualized.  CHEST:  Normal percussion note throughout.  Breath sounds are mildly  diminished with scattered expiratory wheezes throughout.  I:E ratio is  normal.  CARDIAC:  Regular rate and rhythm.  No murmurs.  ABDOMEN:  Soft, nontender, with normal bowel sounds.  EXTREMITIES:  Without clubbing, cyanosis, or edema.  NEUROLOGIC:  No focal deficits.   LABORATORY DATA:  Chest x-ray reveals no acute cardiac or pulmonary disease.  CT scan of the chest performed on the  evening of this consultation to rule  out pulmonary embolism demonstrates minimal density in the high right apex.  This could represent scarring, and less likely a pathological process.   IMPRESSION:  Acute asthmatic bronchitis.  She appears to be appropriately  treated with low-flow oxygen, nebulized bronchodilators, systemic steroids,  and antibiotics.  She believes that she is now starting to turn the  corner.  I told her that it might take another 24-48 hours to sufficiently  improve enough to go home.  I do not see a strong indication to change her  medical regimen, as is outlined in her hospital records.  I will inform Dr.  Maple Hudson of her hospitalization, and if she is still hospitalized on the  morning of Monday, June 18, 2003, he will see her then.  Please call me  sooner if needed.                                               Oley Balm Sung Amabile, M.D. [facility]    DBS/MEDQ  D:  06/16/2003  T:  06/16/2003  Job:  [id number]   cc:   Jethro Bastos, M.D.  [address]  [place]  [place] [address]  Fax: [phone]   Clinton D. Young, M.D.  [address] [place]  [place]  [address]  Fax: [phone]

## 2010-09-19 NOTE — Discharge Summary (Signed)
[facility]  Patient:    [name], YANDA Visit Number: [id number] MRN: [id number]          Service Type: MED Location: 3W [id number] 01 Attending Physician:  Gracelyn Nurse Dictated by:   Gracelyn Nurse, M.D. Admit Date:  01/21/2001 Discharge Date: 01/22/2001   CC:         Jethro Bastos, M.D.  Verlin Grills, M.D.  Vikki Ports, M.D.   Discharge Summary  DISCHARGE DIAGNOSES: 1. Abdominal pain. 2. Hypertension. 3. Depression. 4. Hyperlipidemia. 5. History of proteinuria. 6. Status post total hysterectomy. 7. History of temporomandibular joint surgery. 8. Status post right knee arthroscopy. 9. Status post breast reduction surgery in 1977.  DISCHARGE MEDICATIONS: 1. Prozac 60 mg q.d. 2. Premarin 0.625 mg q.d. 3. Lipitor 20 mg q.d. 4. Hydrochlorothiazide 25 mg q.d. 5. Cipro 500 mg b.i.d.  PROCEDURES:  CT scan which showed changes in the loop of small bowel that were consistent with inflammatory changes, suspicious for Crohns disease or possibly lymphoma.  HISTORY OF PRESENT ILLNESS:  This is a 55 year old white female with no history of abdominal problems.  She presented to the ED with abdominal pain. She states that she has had about a two week history of on and off epigastric pain that is sharp and radiates to her back.  The pain became much worse last night and she vomited one time.  She has had no diarrhea or constipation.  PHYSICAL EXAMINATION:  VITAL SIGNS:  The temperature is 98.6 degrees, pulse 92, respirations 22, and blood pressure 206/119 and on recheck was 149/80.  GENERAL APPEARANCE:  This is an obese white female in obvious pain.  HEENT:  Pupils equal, round, and reactive to light.  Extraocular movements intact.  Tympanic membranes nonbulging.  The oropharynx is dry.  NECK:  Supple.  No JVD.  CARDIOVASCULAR:  Regular rate and rhythm.  No murmurs.  LUNGS:  Clear to auscultation.  ABDOMEN:  Obese.   Bowel sounds are positive.  There is tenderness in the epigastric area.  No rebound or guarding.  EXTREMITIES:  No edema.  NEUROLOGIC:  Cranial nerves II-XII grossly intact.  RECTAL:  The patient refused.  ADMITTING LABORATORY DATA:  Sodium 136, potassium 3.5, chloride 105, CO2 19, BUN 16, creatinine 0.8, glucose 167, total protein 0.3, alkaline phosphatase 68, amylase 31, lipase 17, SGOT 20, SGPT 18, total protein 6.5, albumin 3.7, calcium 9.4.  White blood cell count 12.9, hemoglobin 14.9, platelets 295.  The urinalysis was negative for infection, but showed greater than 300 protein.  HOSPITAL COURSE:  ABDOMINAL PAIN:  The patient was placed NPO and given IV fluids.  The CT scan showed the results above.  We were going to follow the patient the next day with a small bowel follow through.  However, on a KUB x-ray the following morning, the contrast dye from CT scan had no cleared. The patient was having no pain and was able to tolerate a full liquid diet and was wanting to go home.  We scheduled a small bowel follow through as an outpatient for the next day as that would allow time for the contrast dye to clear.  She is to return for that and will follow up on those results and then get a follow-up appointment with her primary care physician.  Because she had an elevated white count and we have not ruled out infection on this yet, we are going to send her home on Cipro 500  mg b.i.d. for one week.  DISCHARGE LABORATORY DATA:  White blood cell count 10.1, hemoglobin 13.1, platelets 243.  Sodium 139, potassium 3.7, CO2 27, chloride 109, BUN 10, creatinine 0.8. Dictated by:   Gracelyn Nurse, M.D. Attending Physician:  Marcelino Duster D DD:  01/22/01 TD:  01/22/01 Job: [id number]

## 2010-09-19 NOTE — H&P (Signed)
Jacqueline, Mckee                             ACCOUNT NO.:  [id number]   MEDICAL RECORD NO.:  [id number]                   PATIENT TYPE:  EMS   LOCATION:  MINO                                 FACILITY:  [facility]   PHYSICIAN:  Sherin Quarry, MD                   DATE OF BIRTH:  11-10-1955   DATE OF ADMISSION:  06/14/2003  DATE OF DISCHARGE:                                HISTORY & PHYSICAL   [name] is a 55 year old lady who discontinued cigarette smoking about  nine months ago.  She states that she has had episodes of asthmatic  bronchitis in the past but cannot recall a recent episode.  She thinks the  last one was about 2-3 years ago.  She does not use a metered dose inhaler  or nebulizer at home.  She reports a 48-hour history of increased cough,  shortness of breath, chest pain with nausea and vomiting x1.  She feels that  she has been running a fever.  She presented to [facility]  emergency room with these complaints and since her presentation has received  Solu-Medrol 125 mg IV x1, an IV of normal saline, azithromycin, and  nebulizer treatments.  She is about 10% better but still has a profound  cough and wheeze.  She is therefore admitted because she will need further  treatment over the next 24 hours.   ALLERGIES:  No known drug allergies.   CURRENT MEDICATIONS:  Patient is uncertain about doses, and the list may not  be complete accurate.  She states that her medicines consist of lisinopril,  HCTZ 1 daily, Effexor, which she takes t.i.d., Lipitor which she takes 1  daily, Ultram 50 t.i.d. p.r.n. for pain, Vicodin p.r.n. for pain and  Catapres, possibly 0.1 mg daily.   OPERATION:  She has also had a hysterectomy in the past.   ILLNESSES:  She denies any history of heart disease, stroke, or diabetes.   FAMILY HISTORY:  Her father died of cancer.  Her mother has a history of  heart disease.  Both her brother and sister are in good health.   SOCIAL HISTORY:   The patient works for [employer].  She  denies alcohol or drug abuse.  She smoked until nine months ago, smoking  about 1-2 packs of cigarettes per day.   REVIEW OF SYSTEMS:  Head:  She denies headache or dizziness.  Eyes:  She  denies visual blurring or diplopia.  ENT:  She denies earache, sinus pain or  sore throat.  CHEST:  See above.  CARDIOVASCULAR:  Denies orthopnea, PND, or  ankle edema.  GI:  She has had some nausea and anorexia.  There has been no  hematemesis, melena, or hematochezia.  GU:  She denies dysuria, urinary  frequency, hesitance or nocturia.  RHEUMATOLOGIC:  Denies back pain or joint  pain.  HEMATOLOGIC:  Denies easy bleeding or bruising.  NEUROLOGIC:  Denies  history of seizure or stroke.   PHYSICAL EXAMINATION:  VITAL SIGNS:  Her temperature is 100.7, blood  pressure 147/84, pulse is 112, respirations 18.  HEENT:  Within normal limits.  CHEST:  Diminished breath sounds diffusely.  Diffuse wheezes are noted with  poor air movement.  I do not hear any focal findings.  HEART:  Normal S1 and S2.  There are murmurs, rubs or gallops.  ABDOMEN:  Benign.  Normal bowel sounds without masses, tenderness, or  organomegaly.  EXTREMITIES:  No evidence of cyanosis or edema.  NEUROLOGIC:  Within normal limits.   Chest x-ray is said to show evidence of acute bronchitis.   Laboratory studies are remarkable for a potassium of 2.8.  Her glucose is  105.  Hemoglobin 13.6.   IMPRESSION:  1. Chronic obstructive pulmonary disease with acute bronchitis.  2. Obesity.  3. History of depression.  4. Hypertension.  5. Hypokalemia.  6. Chronic back pain.   The plan will be to admit the patient for oxygen, IV antibiotics, steroids  and nebulizer treatments.  Hopefully, she can be switched to oral  medications when her condition is improved.  Her usual medications will be  continued, and we will check the appropriate doses.  The patient indicates  that she requests a  consult with Dr. Jetty Duhamel.  We will contact Dr.  Maple Hudson in the a.m.                                                Sherin Quarry, MD    SY/MEDQ  D:  06/14/2003  T:  06/14/2003  Job:  [id number]   cc:   Joni Fears D. Young, M.D.  [address] [place]  [place] [address]  Fax: [phone]   Andee Poles, M.D.  [address], [address]  [place], [place] [address]  Fax: [phone]   Dr. Modena Jansky

## 2010-09-19 NOTE — Op Note (Signed)
[name], [name]                   ACCOUNT NO.:  [id number]   MEDICAL RECORD NO.:  [id number]          PATIENT TYPE:  OBV   LOCATION:  9399                          [facility]ILITY[facility]  [facility]   PHYSICIAN:  Zelphia Cairo, MD    DATE OF BIRTH:  1956/04/08   DATE OF PROCEDURE:  06/18/2005  DATE OF DISCHARGE:                                 OPERATIVE REPORT   PREOPERATIVE DIAGNOSIS:  Pelvic mass.   POSTOPERATIVE DIAGNOSIS:  Right benign serous cystadenoma.   PROCEDURE:  Exploratory laparotomy, lysis of adhesions, right salpingo-  oophorectomy with removal of pelvic mass.   SURGEON:  Zelphia Cairo, M.D.   ASSISTANT:  Dineen Kid. Rana Snare, M.D.   ANESTHESIA:  General.   SPECIMENS:  Right tube, ovary and ovarian cyst.   ESTIMATED BLOOD LOSS:  100 mL.   COMPLICATIONS:  None.   CONDITION:  Stable and extubated to recovery room.   PROCEDURE:  The patient was taken to the operating room, where general  anesthesia was obtained.  She was placed in a supine position.  She was  prepped and draped in sterile fashion.  A midline vertical skin incision was  made with a scalpel and carried down to the underlying fascia.  The fascia  was incised in the midline with a scalpel.  This was extended superiorly and  inferiorly with the Bovie.  We were unable to deliver the mass secondary to  a large amount of adhesions posteriorly, anteriorly and to the right of the  bowel to the cyst wall.  A small incision was made in the cyst wall and the  suction was inserted, and the cyst was drained.  Cyst fluid will be sent to  pathology.  Once this cyst was drained, the hole was clamped with a hemostat  and we were able to identify the planes.  A Balfour retractor was placed and  the bowel was packed back with moist wet laps.  The bowel was sharply  dissected away from the cyst wall and the right adnexa using Metzenbaum  scissors.  The inferior portion of the ovary was dissected free of the  bladder with Metzenbaum  scissors.  The infundibulopelvic ligament was  clamped with a large Kelly clamp, cut with Mayo scissors, and the specimen  was handed off and sent to pathology for frozen section.  The pedicle was  then double ligated with Vicryl and excellent hemostasis was noted.  The  pelvis was then irrigated with warm normal saline and hemostasis was noted.  There was a small ooze from the general peritoneum behind bladder.  Because  I did not want to Bovie this area, Surgicel was placed behind the bladder to  ensure excellent hemostasis.  After the Surgicel was placed, retractors and  laps were removed from the abdomen.  The fascia was closed with looped PDS  in a running fashion.  Interrupted plain gut sutures were placed in the  subcutaneous tissue  and the skin was closed with staples.  The patient tolerated the procedure  well.  Sponge, lap, needle and instrument counts were correct  x2.  She did  receive 2 g of Ancef prior to the incision.  She was extubated and taken to  the recovery room in stable condition.      Zelphia Cairo, MD  Electronically Signed     GA/MEDQ  D:  06/18/2005  T:  06/18/2005  Job:  [id number]

## 2010-09-19 NOTE — Op Note (Signed)
NAME:  Leonette, Tischer NO.:  [id number]   MEDICAL RECORD NO.:  [id number]                   PATIENT TYPE:   LOCATION:                                       FACILITY:   PHYSICIAN:  Harvie Junior, M.D.                DATE OF BIRTH:   DATE OF PROCEDURE:  12/02/2001  DATE OF DISCHARGE:                                 OPERATIVE REPORT   PATIENT IDENTIFICATION:  This is a 55 year old female on the orthopedic  service.   PREOPERATIVE DIAGNOSIS:  Degenerative joint disease.   POSTOPERATIVE DIAGNOSES:  1. Degenerative joint disease.  2. Medial meniscal tear.   PROCEDURE:  1. Debridement of medial meniscal tear with corresponding debridement of     medial femoral condyle and tibial plateau.  2. Debridement of lateral femoral condylar defect.  3. Debridement of chondromalacia in the patella as well as in the trochlea.   SURGEON:  Harvie Junior, M.D.   ASSISTANT:  None.   ANESTHESIA:  General.   BRIEF HISTORY:  A 55 year old female with a long history of having  degenerative joint disease of the knee.  She had a previous arthroscopy  three years ago which helped considerably, but because of continuing  complaints of pain and failure of all conservative care she was brought to  the operating room for operative knee arthroscopy.   DESCRIPTION OF PROCEDURE:  The patient was brought to the operating room  where after adequate anesthesia was obtained with general, the patient was  placed upon the operating table.  The bad leg was then prepped and draped in  the usual sterile fashion.  Following this, routine arthroscopic examination  of the knee revealed there was chondromalacia of the patella, grade 3  variety.  There was grade 3 changes on the trochlea also.  The  patellofemoral joint was then debrided significantly with a suction shaver  to a smooth and stable rim.   Attention was then turned medially where there was noted to be a medial  meniscal tear.  The medial meniscus was debrided back to a smooth and stable  rim.  The medial femoral condyle and medial tibial plateau were identified.  There was a small grade 4 defect in the medial tibial plateau.  There were  grade 3 changes on the medial femoral condyle.   Attention was turned to the anterior cruciate, which was normal.  Attention  was turned laterally.  There was noted to be a 5 mm grade 3 defect on the  lateral femoral condyle which was debrided.  At this point the knee was  copiously irrigated and suctioned dry. A follow-up tract was made medially  for any lesion fragment, any pieces; none were found.  At this point the  knee was copiously irrigated and suctioned dry.  Then 20 cc of 0.25%  Marcaine with 80 mg  of  Depo-Medrol was instilled in the knee for postoperative anesthesia and  pain control.  A sterile compressive dressing was applied and the patient  was taken to the recovery room where she was noted to be in satisfactory  condition.  Estimated blood loss during procedure was __________.                                                Harvie Junior, M.D.    Ranae Plumber  D:  12/02/2001  T:  12/08/2001  Job:  [id number]

## 2010-09-19 NOTE — Discharge Summary (Signed)
[name], [name]                   ACCOUNT NO.:  [id number]   MEDICAL RECORD NO.:  [id number]          PATIENT TYPE:  INP   LOCATION:  9315                          FACILITY[facility]   PHYSICIAN:  Zelphia Cairo, MD    DATE OF BIRTH:  Dec 01, 1955   DATE OF ADMISSION:  06/18/2005  DATE OF DISCHARGE:  06/20/2005                                 DISCHARGE SUMMARY   ADMISSION DIAGNOSIS:  Pelvic mass.   DISCHARGE DIAGNOSIS:  Pelvic mass, status post exploratory laparotomy with  lysis of adhesions, right salpingo-oophorectomy with removal of pelvic mass.   HOSPITAL COURSE:  A 55 year old white female who presented to the hospital  on June 18, 2005 for surgery.  She underwent exploratory laparotomy with  lysis of adhesions, RSO with removal of pelvic mas June 18, 2005.  Please see operative report for further details of this surgery.   Postoperatively, she did well.  She was initially given a PCA and Toradol to  control her pain.  As she was able to tolerate PO, she was changed to oral  pain medications.  Her Foley catheter was removed on postoperative day one  and she began urinating and ambulating without difficulty.   Postoperative hemoglobin was stable at 11.9.  Her diet was advanced without  difficulty and her vitals remained stable without evidence of fever.   On postoperative day two, she was doing well with evidence of flatus.  She  was tolerating a regular diet and her pain was controlled with oral  medications.  Her incision was clean, dry and intact with staples.  She was  discharged home with prescriptions for pain medication and a stool softener.  She was instructed to follow up in one week for staple removal.      Zelphia Cairo, MD  Electronically Signed     GA/MEDQ  D:  08/10/2005  T:  08/11/2005  Job:  [id number]

## 2010-09-19 NOTE — Discharge Summary (Signed)
NAMEEMONNIE, CANNADY                             ACCOUNT NO.:  [id number]   MEDICAL RECORD NO.:  [id number]                   PATIENT TYPE:  INP   LOCATION:  5022                                 FACILITY:  [facility]   PHYSICIAN:  Deirdre Peer. Polite, M.D.              DATE OF BIRTH:  05-01-1956   DATE OF ADMISSION:  06/14/2003  DATE OF DISCHARGE:                                 DISCHARGE SUMMARY   PRIMARY CARE PHYSICIAN:  Dr. Marny Lowenstein   PULMONOLOGIST:  Dr. Jetty Duhamel.   DISCHARGE DIAGNOSES:  1. Chronic obstructive pulmonary disease exacerbation improved at discharge.  2. Hypertension.  3. Depression/obsessive-compulsive disorder.  4. High cholesterol.  5. Sleep apnea not treated.  6. Obesity.   DISCHARGE MEDICATIONS:  1. Avelox 400 mg one daily x5 days.  2. Prednisone taper over 8 days.  3. Combivent MDI two puffs q.6 h.  4. Hydrochlorothiazide 25 mg p.o. daily.  5. Premarin 0.9 mg p.o. daily.  6. Catapres 0.1 mg 2 tabs q.h.s.  7. Ultram 50 mg one to two tabs q.6 h.  8. Lipitor 20 mg daily.  9. Effexor 75 mg three tabs daily.  10.      Vicodin p.r.n.  11.      The patient is asked to hold lisinopril until seen by her primary     M.D.   STUDIES:  The patient had chest x-ray which showed perihilar and  peribronchial changes suggesting bronchitis, asthma, or viral syndrome.  The  patient had CT of the chest with no evidence of PE, patchy right upper lobe  airspace opacity suspicious for possible pneumonia.  However, area is not  entirely specific and could be due to scar or other inflammatory process.  ABG of patient with PCO2 of 32, PO2 of 64, saturation 94%.  CBC within  normal limits.  BMET within normal limits.  TSH was 0.5.   DISPOSITION:  The patient will be discharged to home in stable condition and  asked to follow up with her pulmonologist and her primary M.D. in one week.   HISTORY OF PRESENT ILLNESS:  A 55 year old female with above medical  problems was  admitted to the hospital secondary to fever, shortness of  breath, cough and hypoxia.  Of note, the patient has been a smoker in the  past approximately 6 months ago.  The patient's cough is nonproductive.  The  patient was initially seen by her primary M.D. in the office and was worried  about hypoxia and was sent to the ED for further evaluation.  In the ED the  patient was evaluated, x-ray without infiltrate, CBC within normal limits.  However, there was continued O2 requirement post neb treatment and steroids  in the ED.  Admission was deemed necessary for further evaluation and  treatment.   PAST MEDICAL HISTORY:  As stated above.   PAST SURGICAL HISTORY:  The  patient status post abdominal hysterectomy.  Please see dictated HPI for further details of History of Present Illness.   HOSPITAL COURSE:  Problem 1.  CHRONIC OBSTRUCTIVE PULMONARY DISEASE  EXACERBATION:  The patient was admitted to a medical floor bed for  evaluation and treatment of COPD exacerbation.  As stated, the patient's  chest x-ray was without infiltrate.  The patient was treated with  antibiotics, O2, nebs, and steroids.  Less than 24 hours post  hospitalization, the patient complained that she still did not feel  dramatically improved at all and was plagued by severe coughing that did not  seem to resolve.  The patient was treated conservatively.  Her medications  were reviewed and showed she was on an ACE inhibitor.  It was felt prudent  to hold that medicine until clearing of her current process and ruling that  out as a possibility for her chronic cough, although it is most likely due  to her COPD exacerbation.  The patient had an ABG which showed baseline  hypoxia, CT of the chest which was without PE but did show questionable  infiltrate in the right upper lobe.  The patient was seen by pulmonary per  her request.  They felt that the density on CT was probably insignificant,  possibly scarring in the apex.   He concurred with treatment as outlined.  Over the next 24 to 48 hours, the patient's hospital course was one of  continued improvement.  However, the next day she did state that she was  ready to leave the hospital and when informed that she would be leaving  against medical advice the patient decided to stay in the hospital until the  next 36 hours.  At that time, the patient was hemodynamically stable,  decreased expiratory wheeze and saturation was 94% on room air.  At this  time, the patient is felt to be stable for discharge to home and will have  outpatient follow up with her primary M.D. and her pulmonologist.   Problem 2.  EXCESSIVE COUGH:  As stated above, this is most likely part of  her COPD exacerbation but cannot exclude secondary to her ACE inhibitor as  her cough is dry and nonproductive.  The patient's ACE inhibitor has been  put on hold and she will be asked to continue to hold that medicine until  seen by her primary M.D.  Reintroduce this medicine when she is at baseline  and monitor for symptoms.   Problem 2.  ANXIETY/DEPRESSION:  During this hospitalization, the patient  became very anxiety prone and felt that she was not being treated  adequately.  The patient demanded to see a pulmonary consult and this was  obtained.  The patient was asked if anything else could be done to make her  happy and she stated give her all her medications as indicated and get a  pulmonologist to see her.  This has been granted.  After this acute event,  adding anxiolytics to the patient's medication profile seemed to help  dramatically and there were no further complications.  There is the  possibility the patient had some reaction to the steroids.  The patient will  continue her current treatments for depression and follow up with her  primary M.D.  The patient did ask for a prescription for Xanax to be  discharged to home.  However, because of documentation and medical records sent by  her  primary M.D., I did not feel that this was prudent  and that she should  further discuss with her primary M.D. whether a possible addictive medicine  such as Xanax should be part of her medication regime.  At this time, the  patient is medically stable for discharge.                                                Deirdre Peer. Polite, M.D.    RDP/MEDQ  D:  06/17/2003  T:  06/17/2003  Job:  [id number]   cc:   Joni Fears D. Young, M.D.  [address] [place]  [place] [address]  Fax: [phone]   Jethro Bastos, M.D.  [address]  [place]  [place] [address]  Fax: [phone]

## 2010-09-19 NOTE — H&P (Signed)
Jacqueline, Mckee                   ACCOUNT NO.:  [id number]   MEDICAL RECORD NO.:  [id number]          PATIENT TYPE:  AMB   LOCATION:  SDC                           FACILITY:  [facility]   PHYSICIAN:  Zelphia Cairo, MD    DATE OF BIRTH:  1956-02-05   DATE OF ADMISSION:  DATE OF DISCHARGE:                                HISTORY & PHYSICAL   HISTORY OF PRESENT ILLNESS:  This is a 55 year old white female who presents  for diagnostic laparotomy with vertical incision and unilateral salpingo-  oophorectomy. She presented to me in January with the complaint of a very  large ovarian cyst.  She was being evaluated for chronic low back pain at  which time during an MRI assessment revealed a very large cystic mass within  the pelvis.  It was initially felt to be an enlarged bladder at which time  the patient was sent to Dr. Annabell Howells for evaluation.  Urological evaluation was  negative and it was felt that the cystic mass was ovarian in nature.   PAST MEDICAL HISTORY:  1.  Chronic hypertension.  2.  Migraines.  3.  Hypercholesterolemia.   PAST SURGICAL HISTORY:  1.  Total abdominal hysterectomy and unilateral salpingo-oophorectomy (she      is unsure which ovary was removed).  This was performed for menorrhagia      and dysmenorrhea.  2.  Temporomandibular joint surgery.   ALLERGIES:  None.   MEDICATIONS:  1.  Lisinopril/hydrochlorothiazide.  2.  Vytorin.  3.  Wellbutrin.  4.  Premarin 1.25 mg for three years,  5.  Lasix 80 mg.  6.  OxyContin.  7.  Ultram.  8.  Flexeril.  9.  Hydrocodone.   GYN HISTORY:  1.  Menarche at age 35.  2.  Last Pap smear was in 1993 which was normal.   PHYSICAL EXAMINATION:  VITAL SIGNS:  Height 5 feet 8 inches.  Weight 244  pounds.  Blood pressure 118/72, hemoglobin 14.  HEART:  Regular rate and rhythm.  LUNGS:  Clear bilaterally.  ABDOMEN:  Large cystic mass in the right lower quadrant extending up into  the right upper quadrant and is located above the  level of the umbilicus.  It is nontender. Bimanual exam confirmed the large cystic mass occupying the  pelvic area.  PELVIC:  Cervix is surgically absent.  The mass is mobile and nontender.  RECTAL:  Negative for nodularity.   LABORATORY DATA:  CA125 was normal at 6.5.  On transabdominal transvaginal  ultrasound, a large 18 x 10 x 15 cm cystic mass is seen.  No level of origin  can be determined on evaluation.   The risks, benefits and alternatives were discussed with the patient  including exploratory laparotomy with a vertical incision and unilateral  salpingo-oophorectomy.  Informed consent was obtained.      Zelphia Cairo, MD  Electronically Signed     GA/MEDQ  D:  06/17/2005  T:  06/17/2005  Job:  [id number]

## 2010-09-19 NOTE — Op Note (Signed)
. [facility]  Patient:    [name], [name]                            MRN: [id number] Proc. Date: 10/27/00 Attending:  Alinda Deem, M.D.                           Operative Report  PREOPERATIVE DIAGNOSIS:  Right volar carpal ganglion.  POSTOPERATIVE DIAGNOSIS:  Right volar carpal ganglion.  OPERATION PERFORMED:  Excision of right volar carpal ganglion.  SURGEON:  Alinda Deem, M.D.  ASSISTANT:  None.  ANESTHESIA:  IV regional.  ESTIMATED BLOOD LOSS:  Minimal.  TOURNIQUET TIME:  25 minutes.  FLUID REPLACEMENT:  700 cc of crystalloid.  DRAINS:  None.  INDICATIONS FOR PROCEDURE:  The patient is a 55 year old woman with a one to two-month history of a painful right volar carpal ganglion that transilluminates on physical examination and comes up in the classic area between the bifurcation of the radial artery and the princeps pollicis artery. She desires elective removal of the ganglion and this has been arranged.  She accepts the risks and benefits of surgery.  DESCRIPTION OF PROCEDURE:  The patient was identified by arm band and taken to the operating room at [facility] where the appropriate anesthetic monitors were attached and IV regional anesthesia induced with the forearm tourniquet to the right upper extremity.  The right upper extremity was prepped and draped in the usual sterile fashion from the fingertips to the tourniquet and we began the procedure by making a volar radial longitudinal incision centered over the ganglion starting out at the wrist flexion crease and then going proximally for about 3 to 4 cm.  Small bleeders in the skin and subcutaneous tissues identified and cauterized.  We dissected down to the area where the ganglion was.  It actually ruptured a few days before the surgery but we easily found the sac of the ganglion in between the bifurcation of the radial artery.  We carefully dissected out  the ganglion.  Small venous bleeders were identified and cauterized.  I then traced the neck of the ganglion back to the radial carpal joint.  As it exited the volar aspect of the joint, it was then cauterized to seal off the ganglion neck.  At this point the wound was washed out with normal saline solution.  The skin only was closed with running 5-0 nylon suture.  A dressing of Xeroform, 4 x 4 dressing sponges, Webril and an Ace wrap applied.  The patient was awakened and taken to the recovery room without difficulty after letting the tourniquet down and the fingers were noted to pink up nicely. DD:  10/27/00 TD:  10/27/00 Job: [id number]

## 2010-10-10 ENCOUNTER — Other Ambulatory Visit: Payer: Self-pay | Admitting: Orthopedic Surgery

## 2010-10-10 ENCOUNTER — Other Ambulatory Visit (HOSPITAL_COMMUNITY): Payer: Self-pay | Admitting: Orthopedic Surgery

## 2010-10-10 ENCOUNTER — Encounter (HOSPITAL_COMMUNITY): Payer: BC Managed Care – PPO

## 2010-10-10 ENCOUNTER — Ambulatory Visit (HOSPITAL_COMMUNITY)
Admission: RE | Admit: 2010-10-10 | Discharge: 2010-10-10 | Disposition: A | Discharge status: Home / Self Care | Payer: BC Managed Care – PPO | Source: Ambulatory Visit | Attending: Orthopedic Surgery | Admitting: Orthopedic Surgery

## 2010-10-10 DIAGNOSIS — Z01811 Encounter for preprocedural respiratory examination: Secondary | ICD-10-CM

## 2010-10-10 DIAGNOSIS — Z01812 Encounter for preprocedural laboratory examination: Secondary | ICD-10-CM | POA: Insufficient documentation

## 2010-10-10 DIAGNOSIS — R9431 Abnormal electrocardiogram [ECG] [EKG]: Secondary | ICD-10-CM | POA: Insufficient documentation

## 2010-10-10 DIAGNOSIS — Z01818 Encounter for other preprocedural examination: Secondary | ICD-10-CM | POA: Insufficient documentation

## 2010-10-10 DIAGNOSIS — Z01810 Encounter for preprocedural cardiovascular examination: Secondary | ICD-10-CM | POA: Insufficient documentation

## 2010-10-10 LAB — PROTIME-INR: INR: 0.91 (ref 0.00–1.49)

## 2010-10-10 LAB — COMPREHENSIVE METABOLIC PANEL
ALT: 18 U/L (ref 0–35)
AST: 16 U/L (ref 0–37)
Albumin: 4.1 g/dL (ref 3.5–5.2)
CO2: 31 mEq/L (ref 19–32)
Chloride: 99 mEq/L (ref 96–112)
Creatinine, Ser: 0.6 mg/dL (ref 0.4–1.2)
GFR calc non Af Amer: >60 mL/min (ref >60)
Potassium: 3.1 mEq/L — ABNORMAL LOW (ref 3.5–5.1)
Sodium: 140 mEq/L (ref 135–145)
Total Bilirubin: 0.4 mg/dL (ref 0.3–1.2)

## 2010-10-10 LAB — URINALYSIS, ROUTINE W REFLEX MICROSCOPIC
Glucose, UA: NEGATIVE mg/dL
Ketones, ur: NEGATIVE mg/dL
Leukocytes, UA: NEGATIVE
Nitrite: NEGATIVE
Protein, ur: NEGATIVE mg/dL
pH: 7 (ref 5.0–8.0)

## 2010-10-10 LAB — CBC
Platelets: 251 10*3/uL (ref 150–400)
RBC: 5.13 MIL/uL — ABNORMAL HIGH (ref 3.87–5.11)
RDW: 12.4 % (ref 11.5–15.5)
WBC: 9.9 10*3/uL (ref 4.0–10.5)

## 2010-10-10 LAB — SURGICAL PCR SCREEN: Staphylococcus aureus: POSITIVE — AB

## 2010-10-15 ENCOUNTER — Inpatient Hospital Stay (HOSPITAL_COMMUNITY)
Admission: RE | Admit: 2010-10-15 | Discharge: 2010-10-20 | DRG: 470 | Disposition: A | Discharge status: Home Health | Payer: Medicare Other | Source: Ambulatory Visit | Attending: Orthopedic Surgery | Admitting: Orthopedic Surgery

## 2010-10-15 DIAGNOSIS — F341 Dysthymic disorder: Secondary | ICD-10-CM | POA: Diagnosis present

## 2010-10-15 DIAGNOSIS — I10 Essential (primary) hypertension: Secondary | ICD-10-CM | POA: Diagnosis present

## 2010-10-15 DIAGNOSIS — M1710 Unilateral primary osteoarthritis, unspecified knee: Principal | ICD-10-CM | POA: Diagnosis present

## 2010-10-15 DIAGNOSIS — F17200 Nicotine dependence, unspecified, uncomplicated: Secondary | ICD-10-CM | POA: Diagnosis present

## 2010-10-15 DIAGNOSIS — E119 Type 2 diabetes mellitus without complications: Secondary | ICD-10-CM | POA: Diagnosis present

## 2010-10-15 DIAGNOSIS — E876 Hypokalemia: Secondary | ICD-10-CM | POA: Diagnosis not present

## 2010-10-15 DIAGNOSIS — Z96659 Presence of unspecified artificial knee joint: Secondary | ICD-10-CM

## 2010-10-15 DIAGNOSIS — Z01812 Encounter for preprocedural laboratory examination: Secondary | ICD-10-CM

## 2010-10-15 LAB — GLUCOSE, CAPILLARY: Glucose-Capillary: 194 mg/dL — ABNORMAL HIGH (ref 70–99)

## 2010-10-15 LAB — TYPE AND SCREEN: ABO/RH(D): A POS

## 2010-10-15 NOTE — H&P (Addendum)
[name], [name]                   ACCOUNT NO.:  [id number]  MEDICAL RECORD NO.:  [id number]  LOCATION:                                 FACILITY:  PHYSICIAN:  Ollen Gross, M.D.    DATE OF BIRTH:  1955-11-05  DATE OF ADMISSION: DATE OF DISCHARGE:                             HISTORY & PHYSICAL   CHIEF COMPLAINT:  Left knee pain.  BRIEF HISTORY:  [name] has been followed by Dr. Lequita Halt for worsening pain in her left knee.  She has had multiple cortisone injections and had finished a series of Synvisc injections which unfortunately gave her no relief.  In fact she is having persistent worsening pain in the left knee.  It is limiting what she is able to do and it does hurt her all times.  She now presents for left total knee arthroplasty.  CURRENT MEDICATIONS: 1. Fentanyl. 2. Abilify. 3. Lasix. 4. Lisinopril/hydrochlorothiazide. 5. Cymbalta. 6. Lunesta. 7. Klor-Con. 8. Motrin. 9. Correctol.  DRUG ALLERGIES:  No known drug allergies.  PAST MEDICAL HISTORY: 1. Migraines. 2. Anxiety. 3. Depression. 4. History of bronchitis. 5. History of pneumonia. 6. Hypertension. 7. Hypercholesterolemia. 8. Past history of hepatitis, unsure what kind, thinks it may have     been hepatitis A. 9. Diet-controlled diabetes. 10.Degenerative disk disease. 11.Past history of strep infection in the right knee. 12.Postmenopausal. 13.Childhood illnesses, measles and mumps.  PAST SURGICAL HISTORY: 1. Hysterectomy. 2. TMJ surgery. 3. Benign pelvic tumor excision. 4. Right knee arthroscopy complicated by infection. 5. Right total knee arthroplasty.  FAMILY HISTORY:  Father passed with history of cancer.  Mother is living.  She is in poor health, has heart disease.  SOCIAL HISTORY:  The patient is single.  She works as a Paramedic at [employer], site at [employer], [place].  She is a smoker.  She smokes about 10 cigarettes a day and has done so for about 10 years. She rarely  drinks alcohol.  She lives alone.  She plans to go to her sister's home following her hospital stay.  REVIEW OF SYSTEMS:  GENERAL:  Negative for fevers, chills, or weight change.  HEENT:  Negative for headache, blurred vision, or insomnia. DERMATOLOGIC:  Negative for rash or lesion.  RESPIRATORY:  Negative shortness of breath at rest or with exertion.  CARDIOVASCULAR:  Negative chest pain.  GI:  Negative for nausea, vomiting, or diarrhea.  GU: Negative hematuria or dysuria.  MUSCULOSKELETAL:  Positive for joint pain and joint swelling.  PHYSICAL EXAMINATION:  VITAL SIGNS:  Pulse 84, respirations 18, and blood pressure 118/20 in the left arm. GENERAL:  [name] is alert and oriented x3, well developed, and well nourished in no apparent distress.  She is a pleasant 55 year old female.  She is at stated height of 5 feet 8 inches and a stated weight of 230 pounds. HEENT:  Normocephalic and atraumatic.  Extraocular movements intact. The patient wears glasses. NECK:  Supple.  Full range of motion without lymphadenopathy. CHEST:  Lungs are clear to auscultation bilaterally without wheezes, rhonchi, or rales. HEART:  Regular rate and rhythm without murmur. ABDOMEN:  Bowel sounds present in all 4 quadrants.  Obese and nontender to palpation. EXTREMITIES:  Left knee, negative for effusion.  Range is 5 to 125 degrees, tender with palpation over the medial joint line.  No instability is noted. SKIN:  Unremarkable. NEUROLOGIC:  Intact. PERIPHERAL VASCULAR:  Carotid pulses 2+ bilaterally without bruit.  RADIOGRAPH:  AP and lateral views of the left knee reveal bone-on-bone in the medial compartment with patellofemoral involvement.  IMPRESSION:  End-stage arthritis of the left knee.  PLAN:  Left total knee arthroplasty to be performed by Dr. Lequita Halt.    Rozell Searing, PAC   ______________________________ Ollen Gross, M.D.   LD/MEDQ  D:  10/14/2010  T:  10/14/2010  Job:   [id number]  Electronically Signed by Rozell Searing  on 10/15/2010 09:40:11 AM Electronically Signed by Ollen Gross M.D. on 10/15/2010 03:25:12 PM

## 2010-10-16 LAB — BASIC METABOLIC PANEL
GFR calc Af Amer: >60 mL/min (ref >60)
GFR calc non Af Amer: 57 mL/min — ABNORMAL LOW (ref >60)
Potassium: 4.2 mEq/L (ref 3.5–5.1)
Sodium: 141 mEq/L (ref 135–145)

## 2010-10-16 LAB — CBC
MCHC: 32.3 g/dL (ref 30.0–36.0)
Platelets: 224 10*3/uL (ref 150–400)
RDW: 12.4 % (ref 11.5–15.5)
WBC: 13.8 10*3/uL — ABNORMAL HIGH (ref 4.0–10.5)

## 2010-10-17 LAB — GLUCOSE, CAPILLARY
Glucose-Capillary: 143 mg/dL — ABNORMAL HIGH (ref 70–99)
Glucose-Capillary: 126 mg/dL — ABNORMAL HIGH (ref 70–99)

## 2010-10-17 LAB — CBC
MCV: 88.8 fL (ref 78.0–100.0)
Platelets: 201 10*3/uL (ref 150–400)
RBC: 3.49 MIL/uL — ABNORMAL LOW (ref 3.87–5.11)
RDW: 12.4 % (ref 11.5–15.5)
WBC: 11.4 10*3/uL — ABNORMAL HIGH (ref 4.0–10.5)

## 2010-10-17 LAB — BASIC METABOLIC PANEL
CO2: 32 mEq/L (ref 19–32)
Chloride: 101 mEq/L (ref 96–112)
GFR calc Af Amer: >60 mL/min (ref >60)
Potassium: 3.2 mEq/L — ABNORMAL LOW (ref 3.5–5.1)
Sodium: 139 mEq/L (ref 135–145)

## 2010-10-18 LAB — BASIC METABOLIC PANEL
CO2: 30 mEq/L (ref 19–32)
Calcium: 9.4 mg/dL (ref 8.4–10.5)
Creatinine, Ser: 0.52 mg/dL (ref 0.50–1.10)
GFR calc non Af Amer: >60 mL/min (ref >60)
Sodium: 139 mEq/L (ref 135–145)

## 2010-10-18 LAB — GLUCOSE, CAPILLARY: Glucose-Capillary: 117 mg/dL — ABNORMAL HIGH (ref 70–99)

## 2010-10-18 LAB — CBC
MCH: 30.1 pg (ref 26.0–34.0)
MCHC: 33.7 g/dL (ref 30.0–36.0)
MCV: 89.3 fL (ref 78.0–100.0)
Platelets: 203 10*3/uL (ref 150–400)
RBC: 3.26 MIL/uL — ABNORMAL LOW (ref 3.87–5.11)

## 2010-10-27 NOTE — Op Note (Signed)
[name], [name]                   ACCOUNT NO.:  [id number]  MEDICAL RECORD NO.:  [id number]  LOCATION:  1615                         FACILITY:  [facility]  PHYSICIAN:  Ollen Gross, M.D.    DATE OF BIRTH:  05-May-1955  DATE OF PROCEDURE:  10/15/2010 DATE OF DISCHARGE:                              OPERATIVE REPORT   PREOPERATIVE DIAGNOSIS:  Osteoarthritis, left knee.  POSTOPERATIVE DIAGNOSIS:  Osteoarthritis, left knee.  PROCEDURE:  Left total knee arthroplasty.  SURGEON:  Ollen Gross, M.D.  ASSISTANT:  Rozell Searing, [facility]  ANESTHESIA:  General.  ESTIMATED BLOOD LOSS:  Minimal.  DRAIN:  Hemovac x1.  TOURNIQUET TIME:  41 minutes at 300 mmHg.  COMPLICATIONS:  None.  CONDITION.:  Stable to recovery room.  CLINICAL NOTE:  [name] is a 55 year old female with end-stage arthritis of left knee with progressively worsening pain and dysfunction.  She has failed nonoperative management and presents for total knee arthroplasty.  PROCEDURE IN DETAIL:  After successful administration of general anesthetic, a tourniquet was placed high on her left thigh and left lower extremity was prepped and draped in usual sterile fashion. Extremity was wrapped in Esmarch, knee flexed, tourniquet inflated to 300 mmHg.  Midline incision was made with a #10 blade through subcutaneous tissue to the level of the extensor mechanism.  A fresh blade was used make a medial parapatellar arthrotomy.  Soft tissue on the proximal medial tibia was subperiosteally elevated to the joint line with a knife and into the semimembranosus bursa with a Cobb elevator. Soft tissue laterally was elevated with attention being paid to avoid the patellar tendon on tibial tubercle.  Patella was everted, knee flexed 90 degrees, and ACL and PCL removed.  Drill was used create a starting hole in the distal femur and the canal was thoroughly irrigated.  The 5-degree left valgus alignment guide was placed and the distal femoral  cutting block was pinned to remove 11 mm off the distal femur.  Distal femoral resection was made with an oscillating saw.  The tibia subluxed forward the menisci removed.  Extramedullary tibial alignment guide was placed referencing proximally at the medial aspect of the tibial tubercle and distally along the second metatarsal axis and tibial crest.  The block was pinned to remove 2 mm off the more deficient medial side.  Tibial resection was made with an oscillating saw.  Size three was the most appropriate tibial component and the proximal tibia was prepared with a modular drill and keel punch for the size 3.  Femoral sizing guide was placed.  Size 4 narrow was most appropriate on the femur.  Rotation was marked at the epicondylar axis and confirmed by creating rectangular flexion gap at 90 degrees.  Block was pinned in this rotation and anterior-posterior chamfer cuts were made.  The intercondylar block was placed and that cut made.  Trial size 4 narrow posterior stabilized femur was placed.  A 10-mm posterior stabilized rotating platform insert trial was placed.  With the 10, full extension was achieved with excellent varus-valgus and anterior-posterior balance throughout full range of motion.  The patella was everted and thickness measured to be 24 mm.  Freehand  resection taken to 14 mm, 35 template was placed, lug holes were drilled, trial patella was placed and it tracks normally.  Osteophytes were removed off the posterior femur with the trial in place.  All trials were removed and the cut bone surfaces prepared with pulsatile lavage.  Cement was mixed and once ready for implantation, the size 3 mobile bearing tibial tray, size 4 narrow posterior stabilized femur and 35 patella were cemented in place. Patella was held with a clamp.  Trial 10-mm insert was placed, knee held in full extension, all extruded cement removed.  When the cement fully hardened, then the permanent 10-mm  posterior stabilized rotating platform insert was placed in the tibial tray.  Wound was copiously irrigated with saline solution and then the arthrotomy closed over Hemovac drain with interrupted #1 PDS.  Flexion against gravity was about 135 degrees.  Patella tracks normally.  Tourniquet was released after total time of 41 minutes.  Subcu was closed interrupted 2-0 Vicryl, subcuticular running 4-0 Monocryl.  Catheter for Marcaine pain pump was placed and pump initiated.  Incisions were cleaned and dried and Steri-Strips and bulky sterile dressing were applied.  She was then placed into a knee immobilizer, awakened and transported to recovery in stable condition.     Ollen Gross, M.D.     FA/MEDQ  D:  10/15/2010  T:  10/15/2010  Job:  [id number]  Electronically Signed by Ollen Gross M.D. on 10/27/2010 05:02:47 PM

## 2010-11-27 NOTE — Discharge Summary (Signed)
[name], [name]                   ACCOUNT NO.:  [id number]  MEDICAL RECORD NO.:  [id number]  LOCATION:  1615                         FACILITY:  [facility]  PHYSICIAN:  Ollen Gross, M.D.    DATE OF BIRTH:  10-Jul-1955  DATE OF ADMISSION:  10/15/2010 DATE OF DISCHARGE:  10/20/2010                              DISCHARGE SUMMARY   ADMITTING DIAGNOSES: 1. Osteoarthritis, left knee. 2. Migraines. 3. Anxiety. 4. Depression. 5. History of bronchitis. 6. History of pneumonia. 7. Hypertension. 8. Hypercholesterolemia. 9. Past history of hepatitis. 10.Diet-controlled diabetes mellitus. 11.Degenerative disk disease. 12.Past history of strep infection, right knee. 13.Postmenopausal. 14.Childhood illnesses of measles and mumps.  DISCHARGE DIAGNOSES: 1. Osteoarthritis, left knee, status post left total knee replacement     arthroplasty. 2. Mild postop acute blood loss anemia. 3. Postop hypokalemia, improved. 4. Migraines. 5. Anxiety. 6. Depression. 7. History of bronchitis. 8. History of pneumonia. 9. Hypertension. 10.Hypercholesterolemia. 11.Past history of hepatitis. 12.Diet-controlled diabetes mellitus. 13.Degenerative disk disease. 14.Past history of strep infection, right knee. 15.Postmenopausal. 16.Childhood illnesses of measles and mumps.  PROCEDURE:  October 15, 2010, left total knee.  Surgeon, Dr. Lequita Halt. Assistant, Jacqueline Searing, PA-C.  Anesthesia, general.  Tourniquet time, 41 minutes.  CONSULTS:  None.  BRIEF HISTORY:  The patient is a 55 year old female with end-stage arthritis with progressive worsening pain and dysfunction of left knee, now presents for total knee arthroplasty.  LABORATORY DATA:  The preop CBC not scanned in the chart but postop hemoglobin down to 12, then 10.6.  Last noted H and H of 9.8 and 29.1. Chem panel on admission was not scanned in the chart.  Serial BMETs were followed though potassium dropped from 4.2 to 3.2, back up to  4.3. Remaining electrolytes remained within normal limits.  The BUN started up high at 24, came down to normal level of 14.  HOSPITAL COURSE:  The patient was admitted to [facility], taken to OR, underwent above-stated procedure without complication.  The patient tolerated the procedure well, later transferred to the recovery room on orthopedic floor.  Had little bit of sedation on the evening of surgery but by morning of day #1, she was doing pretty well but still little drowsy.  She had been given some Narcan at night before and had not really been using the PCA very much.  So we DC'ed the PCA and encouraged p.o. meds.  She was started on Xarelto for DVT prophylaxis. She had good O2 sats, about 95% but she was on up to 4 L.  Hemoglobin was stable at 12.  Started getting up out of bed on day #1 one to encourage mobility.  By day #2, she was much more alert, doing better. Potassium was down a little bit, so put her on some oral potassium supplements and slowly progressed with physical therapy.  By day #3, she was getting up with PT but not really meeting all her goals.  Since she remained in the hospital on Saturday and Sunday, she wanted to go home but still had not met all of her goals, so she continued with physical therapy.  Once home arrangements made, she was seen back on  rounds on postop day #5, she had 2 more sessions of physical therapy and when she met all of her goals and continued to progress well, she was discharged home later that day.  DISCHARGE/PLAN: 1. The patient discharged home on October 20, 2010. 2. Discharge diagnoses, please see above. 3. Discharge meds:  Robaxin, OxyIR, Xarelto, continue Abilify,     bisacodyl, Cymbalta, fentanyl patch, potassium, Lasix,     lisinopril/HCTZ and Lunesta.  DIET:  As tolerated.  ACTIVITY:  Total knee protocol.  Home health PT.  FOLLOW-UP:  Two weeks.  DISPOSITION:  Home.  CONDITION ON DISCHARGE:   Improved.    [name], P.A.C.   ______________________________ Ollen Gross, M.D.   ALP/MEDQ  D:  11/20/2010  T:  11/20/2010  Job:  [id number]  Electronically Signed by Patrica Duel P.A.C. on 11/25/2010 09:28:12 AM Electronically Signed by Ollen Gross M.D. on 11/27/2010 03:30:21 PM

## 2011-01-21 LAB — BASIC METABOLIC PANEL
BUN: 12
CO2: 28
Calcium: 9.4
Glucose, Bld: 97
Potassium: 3.3 — ABNORMAL LOW
Sodium: 138

## 2011-01-21 LAB — HEPATIC FUNCTION PANEL
ALT: 24
AST: 17
Bilirubin, Direct: 0.1
Indirect Bilirubin: 0.4
Total Bilirubin: 0.5

## 2011-01-22 LAB — BASIC METABOLIC PANEL
BUN: 9
CO2: 26
CO2: 28
Calcium: 9.2
Chloride: 102
Creatinine, Ser: 0.66
Creatinine, Ser: 0.77
GFR calc non Af Amer: >60
Glucose, Bld: 110 — ABNORMAL HIGH

## 2011-01-22 LAB — DIFFERENTIAL
Basophils Absolute: 0
Basophils Relative: 0
Basophils Relative: 0
Eosinophils Absolute: 0.2
Eosinophils Absolute: 0.3
Monocytes Relative: 7
Neutrophils Relative %: 55
Neutrophils Relative %: 73

## 2011-01-22 LAB — SYNOVIAL CELL COUNT + DIFF, W/ CRYSTALS
Lymphocytes-Synovial Fld: 8
Monocyte-Macrophage-Synovial Fluid: 9 — ABNORMAL LOW
Neutrophil, Synovial: 83 — ABNORMAL HIGH
Other Cells-SYN: 0

## 2011-01-22 LAB — SEDIMENTATION RATE: Sed Rate: 75 — ABNORMAL HIGH

## 2011-01-22 LAB — CBC
MCHC: 34.4
MCHC: 34.5
MCV: 91.8
Platelets: 227
Platelets: 282
RDW: 12
WBC: 8.1

## 2011-01-22 LAB — ANAEROBIC CULTURE

## 2011-01-22 LAB — C-REACTIVE PROTEIN: CRP: 11.1 — ABNORMAL HIGH (ref <0.6)

## 2011-01-22 LAB — GRAM STAIN

## 2011-01-22 LAB — BODY FLUID CULTURE

## 2011-01-23 LAB — DIFFERENTIAL
Basophils Absolute: 0
Basophils Absolute: 0
Basophils Absolute: 0
Basophils Absolute: 0
Basophils Relative: 0
Basophils Relative: 0
Basophils Relative: 0
Eosinophils Absolute: 0.2
Eosinophils Absolute: 0.2
Eosinophils Absolute: 0.2
Eosinophils Relative: 2
Eosinophils Relative: 4
Lymphocytes Relative: 24
Lymphocytes Relative: 26
Lymphs Abs: 2.6
Monocytes Absolute: 0.8
Monocytes Absolute: 0.8
Monocytes Relative: 7
Monocytes Relative: 8
Monocytes Relative: 6
Monocytes Relative: 8
Neutro Abs: 10 — ABNORMAL HIGH
Neutro Abs: 7.6
Neutro Abs: 6.1
Neutro Abs: 8.2 — ABNORMAL HIGH
Neutrophils Relative %: 78 — ABNORMAL HIGH
Neutrophils Relative %: 61
Neutrophils Relative %: 73

## 2011-01-23 LAB — CBC
HCT: 31 — ABNORMAL LOW
HCT: 31.2 — ABNORMAL LOW
HCT: 31.4 — ABNORMAL LOW
HCT: 32.5 — ABNORMAL LOW
HCT: 33.5 — ABNORMAL LOW
Hemoglobin: 11 — ABNORMAL LOW
Hemoglobin: 11.9 — ABNORMAL LOW
Hemoglobin: 10.4 — ABNORMAL LOW
Hemoglobin: 10.4 — ABNORMAL LOW
Hemoglobin: 10.8 — ABNORMAL LOW
Hemoglobin: 11.3 — ABNORMAL LOW
MCHC: 33.5
MCHC: 34.2
MCHC: 33.6
MCHC: 33.8
MCV: 90.8
MCV: 89.9
MCV: 90.1
MCV: 90.4
MCV: 90.6
Platelets: 488 — ABNORMAL HIGH
Platelets: 360
Platelets: 427 — ABNORMAL HIGH
Platelets: 438 — ABNORMAL HIGH
Platelets: 446 — ABNORMAL HIGH
RBC: 3.54 — ABNORMAL LOW
RBC: 3.56 — ABNORMAL LOW
RBC: 3.43 — ABNORMAL LOW
RBC: 3.47 — ABNORMAL LOW
RBC: 3.59 — ABNORMAL LOW
RBC: 3.72 — ABNORMAL LOW
RDW: 11.7
RDW: 12.4
RDW: 11.9
WBC: 10.3
WBC: 10.1
WBC: 11.1 — ABNORMAL HIGH
WBC: 12.3 — ABNORMAL HIGH
WBC: 7.7
WBC: 9.6

## 2011-01-23 LAB — AFB CULTURE WITH SMEAR (NOT AT ARMC)

## 2011-01-23 LAB — ANAEROBIC CULTURE

## 2011-01-23 LAB — WOUND CULTURE

## 2011-01-23 LAB — FUNGUS CULTURE W SMEAR: Fungal Smear: NONE SEEN

## 2011-01-23 LAB — SEDIMENTATION RATE
Sed Rate: 54 — ABNORMAL HIGH
Sed Rate: 68 — ABNORMAL HIGH
Sed Rate: 87 — ABNORMAL HIGH

## 2011-01-23 LAB — C-REACTIVE PROTEIN
CRP: 2.7 — ABNORMAL HIGH (ref <0.6)
CRP: 9.4 — ABNORMAL HIGH (ref <0.6)

## 2011-01-23 LAB — BASIC METABOLIC PANEL
BUN: 11
BUN: 4 — ABNORMAL LOW
BUN: 8
CO2: 24
CO2: 29
Calcium: 9
Calcium: 8.7
Chloride: 101
Chloride: 103
Creatinine, Ser: 0.53
Creatinine, Ser: 0.49
GFR calc non Af Amer: >60
Glucose, Bld: 110 — ABNORMAL HIGH
Glucose, Bld: 98
Potassium: 3.5

## 2011-01-23 LAB — PROTIME-INR
INR: 1
INR: 1.1
Prothrombin Time: 13.4
Prothrombin Time: 14.3
Prothrombin Time: 18.5 — ABNORMAL HIGH

## 2011-01-23 LAB — SYNOVIAL CELL COUNT + DIFF, W/ CRYSTALS
Eosinophils-Synovial: 1
Neutrophil, Synovial: 88 — ABNORMAL HIGH

## 2011-01-23 LAB — TISSUE CULTURE

## 2011-01-23 LAB — VANCOMYCIN, TROUGH: Vancomycin Tr: 8.8

## 2011-01-30 LAB — BASIC METABOLIC PANEL
BUN: 13
BUN: 9
CO2: 30
CO2: 33 — ABNORMAL HIGH
Calcium: 8.6
Calcium: 8.6
Chloride: 100
Chloride: 99
Creatinine, Ser: 0.54
GFR calc Af Amer: >60
GFR calc non Af Amer: >60
Glucose, Bld: 129 — ABNORMAL HIGH
Glucose, Bld: 95
Potassium: 3 — ABNORMAL LOW
Sodium: 137

## 2011-01-30 LAB — COMPREHENSIVE METABOLIC PANEL
AST: 17
CO2: 31
Calcium: 9.8
Creatinine, Ser: 0.66
GFR calc Af Amer: >60
GFR calc non Af Amer: >60

## 2011-01-30 LAB — PROTIME-INR
INR: 1
INR: 1.3
INR: 1.7 — ABNORMAL HIGH
INR: 1.7 — ABNORMAL HIGH
Prothrombin Time: 13.9
Prothrombin Time: 16.7 — ABNORMAL HIGH
Prothrombin Time: 20.8 — ABNORMAL HIGH
Prothrombin Time: 21.1 — ABNORMAL HIGH

## 2011-01-30 LAB — ABO/RH: ABO/RH(D): A POS

## 2011-01-30 LAB — GLUCOSE, CAPILLARY
Glucose-Capillary: 112 — ABNORMAL HIGH
Glucose-Capillary: 112 — ABNORMAL HIGH
Glucose-Capillary: 116 — ABNORMAL HIGH
Glucose-Capillary: 121 — ABNORMAL HIGH

## 2011-01-30 LAB — CBC
Hemoglobin: 10.9 — ABNORMAL LOW
Hemoglobin: 11.4 — ABNORMAL LOW
MCHC: 33.9
MCHC: 34
MCHC: 34.1
MCHC: 35.1
MCV: 87.4
MCV: 88.6
Platelets: 227
Platelets: 271
RBC: 3.68 — ABNORMAL LOW
RBC: 4.89
RDW: 12.5
RDW: 13.5
RDW: 13.5
WBC: 10.9 — ABNORMAL HIGH

## 2011-01-30 LAB — WOUND CULTURE: Culture: NO GROWTH

## 2011-01-30 LAB — URINALYSIS, ROUTINE W REFLEX MICROSCOPIC
Bilirubin Urine: NEGATIVE
Hgb urine dipstick: NEGATIVE
Ketones, ur: NEGATIVE
Protein, ur: NEGATIVE
Urobilinogen, UA: 0.2

## 2011-01-30 LAB — ANAEROBIC CULTURE

## 2011-01-30 LAB — APTT: aPTT: 30

## 2011-01-30 LAB — GRAM STAIN: Gram Stain: NONE SEEN

## 2011-01-30 LAB — TYPE AND SCREEN

## 2011-06-29 DIAGNOSIS — I219 Acute myocardial infarction, unspecified: Secondary | ICD-10-CM

## 2011-06-29 HISTORY — DX: Acute myocardial infarction, unspecified: I21.9

## 2011-06-30 ENCOUNTER — Other Ambulatory Visit: Payer: Self-pay

## 2011-06-30 ENCOUNTER — Emergency Department (HOSPITAL_COMMUNITY): Payer: Medicare Other

## 2011-06-30 ENCOUNTER — Encounter (HOSPITAL_COMMUNITY): Payer: Self-pay | Admitting: *Deleted

## 2011-06-30 ENCOUNTER — Encounter (HOSPITAL_COMMUNITY)
Admission: EM | Disposition: A | Discharge status: Home / Self Care | Payer: Self-pay | Source: Home / Self Care | Attending: Cardiology

## 2011-06-30 ENCOUNTER — Inpatient Hospital Stay (HOSPITAL_COMMUNITY)
Admission: EM | Admit: 2011-06-30 | Discharge: 2011-07-02 | DRG: 247 | Disposition: A | Discharge status: Home / Self Care | Payer: Medicare Other | Attending: Cardiology | Admitting: Cardiology

## 2011-06-30 DIAGNOSIS — E876 Hypokalemia: Secondary | ICD-10-CM | POA: Diagnosis present

## 2011-06-30 DIAGNOSIS — Z809 Family history of malignant neoplasm, unspecified: Secondary | ICD-10-CM

## 2011-06-30 DIAGNOSIS — E785 Hyperlipidemia, unspecified: Secondary | ICD-10-CM | POA: Diagnosis present

## 2011-06-30 DIAGNOSIS — I214 Non-ST elevation (NSTEMI) myocardial infarction: Principal | ICD-10-CM | POA: Diagnosis present

## 2011-06-30 DIAGNOSIS — I2510 Atherosclerotic heart disease of native coronary artery without angina pectoris: Secondary | ICD-10-CM | POA: Diagnosis present

## 2011-06-30 DIAGNOSIS — Z8249 Family history of ischemic heart disease and other diseases of the circulatory system: Secondary | ICD-10-CM

## 2011-06-30 DIAGNOSIS — G8929 Other chronic pain: Secondary | ICD-10-CM | POA: Diagnosis present

## 2011-06-30 DIAGNOSIS — Z90710 Acquired absence of both cervix and uterus: Secondary | ICD-10-CM

## 2011-06-30 DIAGNOSIS — M1990 Unspecified osteoarthritis, unspecified site: Secondary | ICD-10-CM | POA: Diagnosis present

## 2011-06-30 DIAGNOSIS — G43909 Migraine, unspecified, not intractable, without status migrainosus: Secondary | ICD-10-CM | POA: Diagnosis present

## 2011-06-30 DIAGNOSIS — I10 Essential (primary) hypertension: Secondary | ICD-10-CM | POA: Diagnosis present

## 2011-06-30 DIAGNOSIS — Z7982 Long term (current) use of aspirin: Secondary | ICD-10-CM

## 2011-06-30 DIAGNOSIS — B159 Hepatitis A without hepatic coma: Secondary | ICD-10-CM | POA: Insufficient documentation

## 2011-06-30 DIAGNOSIS — F411 Generalized anxiety disorder: Secondary | ICD-10-CM | POA: Diagnosis present

## 2011-06-30 DIAGNOSIS — F17200 Nicotine dependence, unspecified, uncomplicated: Secondary | ICD-10-CM | POA: Diagnosis present

## 2011-06-30 HISTORY — DX: Inflammatory liver disease, unspecified: K75.9

## 2011-06-30 HISTORY — DX: Hyperlipidemia, unspecified: E78.5

## 2011-06-30 HISTORY — DX: Migraine, unspecified, not intractable, without status migrainosus: G43.909

## 2011-06-30 HISTORY — DX: Acute myocardial infarction, unspecified: I21.9

## 2011-06-30 HISTORY — DX: Essential (primary) hypertension: I10

## 2011-06-30 HISTORY — PX: LEFT HEART CATHETERIZATION WITH CORONARY ANGIOGRAM: SHX5451

## 2011-06-30 HISTORY — DX: Unspecified osteoarthritis, unspecified site: M19.90

## 2011-06-30 HISTORY — DX: Anxiety disorder, unspecified: F41.9

## 2011-06-30 HISTORY — DX: Depression, unspecified: F32.A

## 2011-06-30 HISTORY — DX: Pneumonia, unspecified organism: J18.9

## 2011-06-30 HISTORY — DX: Other chronic pain: G89.29

## 2011-06-30 HISTORY — PX: PERCUTANEOUS CORONARY STENT INTERVENTION (PCI-S): SHX5485

## 2011-06-30 HISTORY — DX: Major depressive disorder, single episode, unspecified: F32.9

## 2011-06-30 LAB — CBC
HCT: 42.2 % (ref 36.0–46.0)
Hemoglobin: 14.6 g/dL (ref 12.0–15.0)
MCV: 88.4 fL (ref 78.0–100.0)
Platelets: 235 10*3/uL (ref 150–400)
RBC: 4.77 MIL/uL (ref 3.87–5.11)
RBC: 4.23 MIL/uL (ref 3.87–5.11)
RDW: 12.3 % (ref 11.5–15.5)
WBC: 13.1 10*3/uL — ABNORMAL HIGH (ref 4.0–10.5)
WBC: 11.1 10*3/uL — ABNORMAL HIGH (ref 4.0–10.5)

## 2011-06-30 LAB — CARDIAC PANEL(CRET KIN+CKTOT+MB+TROPI)
CK, MB: 242 ng/mL (ref 0.3–4.0)
Troponin I: >25 ng/mL (ref <0.30)

## 2011-06-30 LAB — POCT I-STAT TROPONIN I

## 2011-06-30 LAB — BASIC METABOLIC PANEL
BUN: 18 mg/dL (ref 6–23)
BUN: 15 mg/dL (ref 6–23)
CO2: 27 mEq/L (ref 19–32)
Chloride: 100 mEq/L (ref 96–112)
Chloride: 101 mEq/L (ref 96–112)
GFR calc Af Amer: >90 mL/min (ref >90)
GFR calc Af Amer: >90 mL/min (ref >90)
Glucose, Bld: 112 mg/dL — ABNORMAL HIGH (ref 70–99)
Glucose, Bld: 99 mg/dL (ref 70–99)
Potassium: 3.3 mEq/L — ABNORMAL LOW (ref 3.5–5.1)
Potassium: 3.1 mEq/L — ABNORMAL LOW (ref 3.5–5.1)

## 2011-06-30 LAB — TSH: TSH: 0.481 u[IU]/mL (ref 0.350–4.500)

## 2011-06-30 LAB — MRSA PCR SCREENING: MRSA by PCR: NEGATIVE

## 2011-06-30 SURGERY — LEFT HEART CATHETERIZATION WITH CORONARY ANGIOGRAM
Anesthesia: LOCAL

## 2011-06-30 MED ORDER — IBUPROFEN 800 MG PO TABS
800.0000 mg | ORAL_TABLET | Freq: Four times a day (QID) | ORAL | Status: DC | PRN
  Filled 2011-06-30: qty 1

## 2011-06-30 MED ORDER — SODIUM CHLORIDE 0.9 % IV SOLN
INTRAVENOUS | Status: DC
  Administered 2011-06-30: 21:00:00 via INTRAVENOUS

## 2011-06-30 MED ORDER — INFLUENZA VIRUS VACC SPLIT PF IM SUSP
0.5000 mL | INTRAMUSCULAR | Status: AC
  Administered 2011-07-01: 0.5 mL via INTRAMUSCULAR
  Filled 2011-06-30: qty 0.5

## 2011-06-30 MED ORDER — NITROGLYCERIN IN D5W 200-5 MCG/ML-% IV SOLN
5.0000 ug/min | Freq: Once | INTRAVENOUS | Status: AC
  Administered 2011-06-30: 5 ug/min via INTRAVENOUS
  Filled 2011-06-30: qty 250

## 2011-06-30 MED ORDER — LISINOPRIL-HYDROCHLOROTHIAZIDE 20-25 MG PO TABS: 1.0000 | ORAL_TABLET | Freq: Every day | ORAL | Status: DC

## 2011-06-30 MED ORDER — POTASSIUM CHLORIDE CRYS ER 20 MEQ PO TBCR
20.0000 meq | EXTENDED_RELEASE_TABLET | Freq: Two times a day (BID) | ORAL | Status: DC
  Administered 2011-06-30 – 2011-07-01 (×2): 20 meq via ORAL
  Filled 2011-06-30 (×3): qty 1

## 2011-06-30 MED ORDER — ACETAMINOPHEN 325 MG PO TABS: 650.0000 mg | ORAL_TABLET | ORAL | Status: DC | PRN

## 2011-06-30 MED ORDER — NITROGLYCERIN IN D5W 200-5 MCG/ML-% IV SOLN: 3.0000 ug/min | INTRAVENOUS | Status: DC

## 2011-06-30 MED ORDER — FENTANYL CITRATE 0.05 MG/ML IJ SOLN
INTRAMUSCULAR | Status: AC
  Filled 2011-06-30: qty 2

## 2011-06-30 MED ORDER — NITROGLYCERIN 0.2 MG/ML ON CALL CATH LAB
INTRAVENOUS | Status: AC
  Filled 2011-06-30: qty 1

## 2011-06-30 MED ORDER — MORPHINE SULFATE 2 MG/ML IJ SOLN
2.0000 mg | INTRAMUSCULAR | Status: DC | PRN
  Administered 2011-06-30 – 2011-07-01 (×2): 2 mg via INTRAVENOUS
  Filled 2011-06-30 (×2): qty 1

## 2011-06-30 MED ORDER — SODIUM CHLORIDE 0.9 % IV SOLN: 250.0000 mL | INTRAVENOUS | Status: DC | PRN

## 2011-06-30 MED ORDER — ASPIRIN 81 MG PO CHEW
81.0000 mg | CHEWABLE_TABLET | Freq: Every day | ORAL | Status: DC
  Administered 2011-06-30 – 2011-07-02 (×3): 81 mg via ORAL
  Filled 2011-06-30 (×3): qty 1

## 2011-06-30 MED ORDER — ZOLPIDEM TARTRATE 5 MG PO TABS: 5.0000 mg | ORAL_TABLET | Freq: Every evening | ORAL | Status: DC | PRN

## 2011-06-30 MED ORDER — VERAPAMIL HCL 2.5 MG/ML IV SOLN
INTRAVENOUS | Status: AC
  Filled 2011-06-30: qty 2

## 2011-06-30 MED ORDER — SODIUM CHLORIDE 0.9 % IV SOLN
Freq: Once | INTRAVENOUS | Status: AC
  Administered 2011-06-30: 14:00:00 via INTRAVENOUS

## 2011-06-30 MED ORDER — FUROSEMIDE 80 MG PO TABS
80.0000 mg | ORAL_TABLET | Freq: Every day | ORAL | Status: DC
  Administered 2011-07-01 – 2011-07-02 (×2): 80 mg via ORAL
  Filled 2011-06-30 (×2): qty 1

## 2011-06-30 MED ORDER — ONDANSETRON HCL 4 MG/2ML IJ SOLN: 4.0000 mg | Freq: Four times a day (QID) | INTRAMUSCULAR | Status: DC | PRN

## 2011-06-30 MED ORDER — NITROGLYCERIN 0.4 MG SL SUBL
0.4000 mg | SUBLINGUAL_TABLET | SUBLINGUAL | Status: DC | PRN
  Administered 2011-06-30 (×2): 0.4 mg via SUBLINGUAL
  Filled 2011-06-30 (×2): qty 25

## 2011-06-30 MED ORDER — DULOXETINE HCL 60 MG PO CPEP
90.0000 mg | ORAL_CAPSULE | Freq: Every day | ORAL | Status: DC
  Administered 2011-07-01 – 2011-07-02 (×2): 90 mg via ORAL
  Filled 2011-06-30 (×2): qty 1

## 2011-06-30 MED ORDER — LIDOCAINE HCL (PF) 1 % IJ SOLN
INTRAMUSCULAR | Status: AC
  Filled 2011-06-30: qty 30

## 2011-06-30 MED ORDER — TICAGRELOR 90 MG PO TABS
ORAL_TABLET | ORAL | Status: AC
  Filled 2011-06-30: qty 2

## 2011-06-30 MED ORDER — SODIUM CHLORIDE 0.9 % IV SOLN: INTRAVENOUS | Status: DC

## 2011-06-30 MED ORDER — METOPROLOL TARTRATE 12.5 MG HALF TABLET: 12.5000 mg | ORAL_TABLET | Freq: Two times a day (BID) | ORAL | Status: DC

## 2011-06-30 MED ORDER — LISINOPRIL 20 MG PO TABS
20.0000 mg | ORAL_TABLET | Freq: Every day | ORAL | Status: DC
  Administered 2011-07-01 – 2011-07-02 (×2): 20 mg via ORAL
  Filled 2011-06-30 (×2): qty 1

## 2011-06-30 MED ORDER — FENTANYL 50 MCG/HR TD PT72
50.0000 ug | MEDICATED_PATCH | TRANSDERMAL | Status: DC
  Administered 2011-07-01: 50 ug via TRANSDERMAL
  Filled 2011-06-30: qty 1

## 2011-06-30 MED ORDER — HYDROCHLOROTHIAZIDE 25 MG PO TABS
25.0000 mg | ORAL_TABLET | Freq: Every day | ORAL | Status: DC
  Filled 2011-06-30: qty 1

## 2011-06-30 MED ORDER — TICAGRELOR 90 MG PO TABS
90.0000 mg | ORAL_TABLET | Freq: Two times a day (BID) | ORAL | Status: DC
  Administered 2011-07-01 – 2011-07-02 (×3): 90 mg via ORAL
  Filled 2011-06-30 (×5): qty 1

## 2011-06-30 MED ORDER — ASPIRIN EC 81 MG PO TBEC: 81.0000 mg | DELAYED_RELEASE_TABLET | Freq: Every day | ORAL | Status: DC

## 2011-06-30 MED ORDER — NITROGLYCERIN 0.4 MG SL SUBL: 0.4000 mg | SUBLINGUAL_TABLET | SUBLINGUAL | Status: DC | PRN

## 2011-06-30 MED ORDER — ACETAMINOPHEN 325 MG PO TABS
650.0000 mg | ORAL_TABLET | ORAL | Status: DC | PRN
  Filled 2011-06-30: qty 1

## 2011-06-30 MED ORDER — MIDAZOLAM HCL 2 MG/2ML IJ SOLN
INTRAMUSCULAR | Status: AC
  Filled 2011-06-30: qty 2

## 2011-06-30 MED ORDER — SODIUM CHLORIDE 0.9 % IJ SOLN: 3.0000 mL | INTRAMUSCULAR | Status: DC | PRN

## 2011-06-30 MED ORDER — ADULT MULTIVITAMIN W/MINERALS CH
1.0000 | ORAL_TABLET | Freq: Every day | ORAL | Status: DC
  Administered 2011-07-01 – 2011-07-02 (×2): 1 via ORAL
  Filled 2011-06-30 (×2): qty 1

## 2011-06-30 MED ORDER — SIMVASTATIN 40 MG PO TABS
40.0000 mg | ORAL_TABLET | Freq: Every day | ORAL | Status: DC
  Administered 2011-06-30 – 2011-07-01 (×2): 40 mg via ORAL
  Filled 2011-06-30 (×3): qty 1

## 2011-06-30 MED ORDER — POTASSIUM CHLORIDE CRYS ER 20 MEQ PO TBCR
40.0000 meq | EXTENDED_RELEASE_TABLET | Freq: Once | ORAL | Status: AC
  Administered 2011-06-30: 40 meq via ORAL
  Filled 2011-06-30: qty 2

## 2011-06-30 MED ORDER — SODIUM CHLORIDE 0.9 % IJ SOLN: 3.0000 mL | Freq: Two times a day (BID) | INTRAMUSCULAR | Status: DC

## 2011-06-30 MED ORDER — BIVALIRUDIN 250 MG IV SOLR
INTRAVENOUS | Status: AC
  Filled 2011-06-30: qty 250

## 2011-06-30 MED ORDER — HEPARIN NICU/PEDS BOLUS VIA INFUSION
4000.0000 [IU] | Freq: Once | INTRAVENOUS | Status: AC
  Administered 2011-06-30: 4000 [IU] via INTRAVENOUS
  Filled 2011-06-30: qty 4000

## 2011-06-30 MED ORDER — ASPIRIN 81 MG PO CHEW: 324.0000 mg | CHEWABLE_TABLET | ORAL | Status: DC

## 2011-06-30 MED ORDER — HEPARIN SOD (PORCINE) IN D5W 100 UNIT/ML IV SOLN
1200.0000 [IU]/h | INTRAVENOUS | Status: DC
  Administered 2011-06-30: 1200 [IU]/h via INTRAVENOUS
  Filled 2011-06-30: qty 250

## 2011-06-30 MED ORDER — HEPARIN (PORCINE) IN NACL 2-0.9 UNIT/ML-% IJ SOLN
INTRAMUSCULAR | Status: AC
  Filled 2011-06-30: qty 2000

## 2011-06-30 MED ORDER — ARIPIPRAZOLE 5 MG PO TABS
5.0000 mg | ORAL_TABLET | Freq: Every day | ORAL | Status: DC
  Administered 2011-07-01 – 2011-07-02 (×2): 5 mg via ORAL
  Filled 2011-06-30 (×2): qty 1

## 2011-06-30 MED ORDER — ALPRAZOLAM 0.25 MG PO TABS
0.2500 mg | ORAL_TABLET | Freq: Once | ORAL | Status: AC
  Administered 2011-06-30: 0.25 mg via ORAL
  Filled 2011-06-30: qty 1

## 2011-06-30 MED ORDER — METOPROLOL TARTRATE 12.5 MG HALF TABLET
12.5000 mg | ORAL_TABLET | Freq: Two times a day (BID) | ORAL | Status: DC
  Administered 2011-06-30 – 2011-07-02 (×4): 12.5 mg via ORAL
  Filled 2011-06-30 (×5): qty 1

## 2011-06-30 MED ORDER — SIMVASTATIN 40 MG PO TABS: 40.0000 mg | ORAL_TABLET | Freq: Every day | ORAL | Status: DC

## 2011-06-30 MED ORDER — ASPIRIN 325 MG PO TABS
325.0000 mg | ORAL_TABLET | ORAL | Status: AC
  Administered 2011-06-30: 325 mg via ORAL
  Filled 2011-06-30: qty 1

## 2011-06-30 MED ORDER — DIAZEPAM 5 MG PO TABS: 5.0000 mg | ORAL_TABLET | ORAL | Status: DC

## 2011-06-30 NOTE — ED Provider Notes (Signed)
History     CSN: [id number]  Arrival date & time 06/30/11  1049   First MD Initiated Contact with Patient 06/30/11 1220      Chief Complaint  Patient presents with  . Chest Pain  . Back Pain    HPI Pt was seen at 1205.  Per pt, c/o gradual onset and persistence of constant mid-sternal chest "pain" that began last night.  Pt describes the pain as "tightness," radiates into her mid-back and left arm areas, and has been assoc with nausea and SOB.  States the pain mildly improves with rest.  Denies hx of similar pain, no palpitations, no cough, no abd pain, no vomiting/diarrhea, no rash, no fevers.   PMD:  Rulon Abide Past Medical History  Diagnosis Date  . Hypertension   . Hyperlipemia   . Chronic pain   . Diabetes mellitus   . Degenerative joint disease   . Anxiety   . Migraine headache   . Hepatitis     Past Surgical History  Procedure Date  . Knee surgery   . Abdominal hysterectomy   . Abdominal surgery   . Tmj arthroplasty   . Replacement total knee     History  Substance Use Topics  . Smoking status: Current Everyday Smoker  . Smokeless tobacco: Not on file  . Alcohol Use: No    Review of Systems ROS: Statement: All systems negative except as marked or noted in the HPI; Constitutional: Negative for fever and chills. ; ; Eyes: Negative for eye pain, redness and discharge. ; ; ENMT: Negative for ear pain, hoarseness, nasal congestion, sinus pressure and sore throat. ; ; Cardiovascular: +CP, SOB. Negative for palpitations, diaphoresis, and peripheral edema. ; ; Respiratory: Negative for cough, wheezing and stridor. ; ; Gastrointestinal: +nausea.  Negative for vomiting, diarrhea, abdominal pain, blood in stool, hematemesis, jaundice and rectal bleeding. . ; ; Genitourinary: Negative for dysuria, flank pain and hematuria. ; ; Musculoskeletal: +back pain. Negative for neck pain. Negative for swelling and trauma.; ; Skin: Negative for pruritus, rash, abrasions, blisters,  bruising and skin lesion.; ; Neuro: Negative for headache, lightheadedness and neck stiffness. Negative for weakness, altered level of consciousness , altered mental status, extremity weakness, paresthesias, involuntary movement, seizure and syncope.     Allergies  Review of patient's allergies indicates no known allergies.  Home Medications   Current Outpatient Rx  Name Route Sig Dispense Refill  . ARIPIPRAZOLE 5 MG PO TABS Oral Take 5 mg by mouth daily.    . DULOXETINE HCL 30 MG PO CPEP Oral Take 90 mg by mouth daily.    Alfonso Patten PO Oral Take 1 tablet by mouth at bedtime.    . FENTANYL 50 MCG/HR TD PT72 Transdermal Place 1 patch onto the skin every other day.    . FUROSEMIDE 80 MG PO TABS Oral Take 80 mg by mouth daily.    . IBUPROFEN 200 MG PO TABS Oral Take 800 mg by mouth 2 (two) times daily as needed. For pain    . LISINOPRIL-HYDROCHLOROTHIAZIDE 20-25 MG PO TABS Oral Take 1 tablet by mouth daily.    . ADULT MULTIVITAMIN W/MINERALS CH Oral Take 1 tablet by mouth daily.    Marland Kitchen POTASSIUM CHLORIDE CRYS ER 20 MEQ PO TBCR Oral Take 20 mEq by mouth 2 (two) times daily.    Marland Kitchen SIMVASTATIN PO Oral Take 1 tablet by mouth daily.      BP 113/65  Pulse 83  Temp(Src) 98.4 F (36.9 C) (  Oral)  Resp 16  Ht 5\' 8"  (1.727 m)  Wt 230 lb (104.327 kg)  BMI 34.97 kg/m2  SpO2 98%  Physical Exam 1210: Physical examination:  Nursing notes reviewed; Vital signs and O2 SAT reviewed;  Constitutional: Well developed, Well nourished, Well hydrated, In no acute distress; Head:  Normocephalic, atraumatic; Eyes: EOMI, PERRL, No scleral icterus; ENMT: Mouth and pharynx normal, Mucous membranes moist; Neck: Supple, Full range of motion, No lymphadenopathy; Cardiovascular: Regular rate and rhythm, No murmur or gallop; Respiratory: Breath sounds clear & equal bilaterally, No rales, rhonchi, wheezes, Normal respiratory effort/excursion; Chest: Nontender, Movement normal; Abdomen: Soft, Nontender, Nondistended, Normal  bowel sounds; Extremities: Pulses normal, No tenderness, No edema, No calf edema or asymmetry.; Neuro: AA&Ox3, Major CN grossly intact.  No gross focal motor or sensory deficits in extremities.; Skin: Color normal, Warm, Dry, no rash.    ED Course  Procedures  1305:  Dx testing d/w pt.  Questions answered.  Verb understanding, agreeable to admit.  Pt's EKG without acute STE, but new flipped T-waves and scooped inf ST segments compared to previous EKG, as well as +troponin.  Pt rec'd ASA, 1 sl ntg with near resolution/improvement in symptoms.  Will start IV heparin and ntg gtt as well as 2nd IV line.  Pt moved from stretcher triage area to Pod A.  T/C to [facility] Cards Dr. Mayford Knife, case discussed, including:  HPI, pertinent PM/SHx, VS/PE, dx testing, ED course and treatment:  Agreeable with ED treatment, will come to ED to admit.    MDM  MDM Reviewed: nursing note, vitals and previous chart Reviewed previous: ECG Interpretation: ECG, labs and x-ray Total time providing critical care: 30-74 minutes. This excludes time spent performing separately reportable procedures and services. Consults: cardiology   CRITICAL CARE Performed by: Laray Anger Total critical care time: 35 Critical care time was exclusive of separately billable procedures and treating other patients. Critical care was necessary to treat or prevent imminent or life-threatening deterioration. Critical care was time spent personally by me on the following activities: development of treatment plan with patient and/or surrogate as well as nursing, discussions with consultants, evaluation of patient's response to treatment, examination of patient, obtaining history from patient or surrogate, ordering and performing treatments and interventions, ordering and review of laboratory studies, ordering and review of radiographic studies, pulse oximetry and re-evaluation of patient's condition.    Date: 06/30/2011  Rate: 100  Rhythm:  sinus tachycardia  QRS Axis: normal  Intervals: normal  ST/T Wave abnormalities: nonspecific ST/T changes, scooped ST segments inf leads, new flipped T-waves leads avL and V2, flattened T-wave in lead I  Conduction Disutrbances:none  Narrative Interpretation:   Old EKG Reviewed: changes noted compared to previous EKG dated 10/10/2010.   Date: 06/30/2011  Rate: 85  Rhythm: normal sinus rhythm  QRS Axis: normal  Intervals: QT prolonged  ST/T Wave abnormalities: nonspecific ST/T changes, scooped ST segments inf leads, continued flipped T-waves leads V2 and avL, new flipped T-wave lead I   Conduction Disutrbances:none  Narrative Interpretation:   Old EKG Reviewed: changes noted from previous EKG completed today.      Results for orders placed during the hospital encounter of 06/30/11  CBC      Component Value Range   WBC 13.1 (*) 4.0 - 10.5 (K/uL)   RBC 4.77  3.87 - 5.11 (MIL/uL)   Hemoglobin 14.6  12.0 - 15.0 (g/dL)   HCT 16.1  09.6 - 04.5 (%)   MCV 88.5  78.0 -  100.0 (fL)   MCH 30.6  26.0 - 34.0 (pg)   MCHC 34.6  30.0 - 36.0 (g/dL)   RDW 16.1  09.6 - 04.5 (%)   Platelets 281  150 - 400 (K/uL)  BASIC METABOLIC PANEL      Component Value Range   Sodium 139  135 - 145 (mEq/L)   Potassium 3.3 (*) 3.5 - 5.1 (mEq/L)   Chloride 100  96 - 112 (mEq/L)   CO2 27  19 - 32 (mEq/L)   Glucose, Bld 112 (*) 70 - 99 (mg/dL)   BUN 18  6 - 23 (mg/dL)   Creatinine, Ser 4.09  0.50 - 1.10 (mg/dL)   Calcium 81.1  8.4 - 10.5 (mg/dL)   GFR calc non Af Amer >90  >90 (mL/min)   GFR calc Af Amer >90  >90 (mL/min)  POCT I-STAT TROPONIN I      Component Value Range   Troponin i, poc 9.51 (*) 0.00 - 0.08 (ng/mL)   Comment NOTIFIED PHYSICIAN     Comment 3            Dg Chest 2 View 06/30/2011  *RADIOLOGY REPORT*  Clinical Data: Chest pain  CHEST - 2 VIEW  Comparison: 10/10/2010  Findings: Heart size and vascularity are normal.  Negative for pneumonia.  Negative for mass or effusion.  Lungs are clear.   IMPRESSION: No acute cardiopulmonary disease.  Original Report Authenticated By: Camelia Phenes, M.D.             Laray Anger, DO 07/02/11 1730

## 2011-06-30 NOTE — CV Procedure (Signed)
PROCEDURE:  Left heart catheterization with selective coronary angiography, left ventriculogram.  PCI LAD.  Aspiration thrombectomy of the LAD.   INDICATIONS:  NSTEMI  The risks, benefits, and details of the procedure were explained to the patient.  The patient verbalized understanding and wanted to proceed.  Informed written consent was obtained.  PROCEDURE TECHNIQUE:  After Xylocaine anesthesia a 47F sheath was placed in the right radial artery with a single anterior needle wall stick.   Left coronary angiography was done using a CLS 3.0  guide catheter.  Right coronary angiography was done using a Judkins R4 guide catheter.  Left ventriculography was done using a pigtail catheter.    CONTRAST:  Total of 170 cc.  COMPLICATIONS:  None.    HEMODYNAMICS:  Aortic pressure was 148/90; LV pressure was 151/16; LVEDP 32.  There was no gradient between the left ventricle and aorta.    ANGIOGRAPHIC DATA:   The left main coronary artery is widely patent.  The left anterior descending artery is a large vessel.  There is mild to moderate proximal disease.  The mid vessel is occluded.  There is collateral filling of the distal LAD from the right coronary system.  The diagonals fill by left to left collaterals.    The left circumflex artery is a large vessel with mild irregularities.  The OM1 is small.  There is a branch from the small OM1 which is occluded and fills by left to left collaterals.  There is a large OM2 which is widely patent.  The right coronary artery is a large dominant vessel with mild irregularities.  Large dual PDA system which is widely patent.  Small PLA.  PCI NARRATIVE: A CLS 3.0 guiding catheter was used to engage the left main.  A pro water wire was placed down the LAD.  A Pronto aspiration thrombectomy catheter was used to successfully remove intra-arterial thrombus.  A 2.5 x 20 emergent balloon was then used to dilate the diseased area.  After the aspiration thrombectomy, it was  apparent that there is a very long area of stenosis.  The distal vessel was underfilled and small.  The size improved after intracoronary nitroglycerin was administered to treat spasm.  There was one area in the more mid to distal LAD which was not well treated by the nitroglycerin.  We went back with the same balloon and predilated this area.  A 2.5 x 38 Promus element stent was then used to treat the distal area of stenosis.  A 3.0 x 32 Promus element stent was then placed proximally, in an overlapping fashion to treat the more proximal area of disease.  A 3.5 x 20 Hawkeye Quantum apex balloon was used to post dilate the entire stented area.  Distally, the balloon was inflated to 10 atmospheres.  More proximally, the balloon was inflated to 20 atmospheres.  There is no residual stenosis.  The lesion length was 65 mm.  Initial TIMI 0 flow was improved to TIMI 3 flow at the end of the case.  LEFT VENTRICULOGRAM:  Left ventricular angiogram was done in the 30 RAO projection and revealed severely decreased systolic function with an estimated ejection fraction of 30%.  LVEDP was 32 mmHg.  Mid to distal anterior and apical hypokinesis.       IMPRESSIONS:  1. Occluded LAD causing MI.  This was successfully treated with two overlapping drug eluting stents, 2.5 x 38 mm Promus and 3.0 x 32 mm Promus Element stents. 2.   Mild  disease in the right coronary artery.  Very small branch off of the OM1 which is occluded and fills by left to left collaterals. 2. Severe left ventricular systolic dysfunction.  LVEDP   32  mmHg.  Ejection fraction  30 %.  Mid to distal anterior and apical hypokinesis.    RECOMMENDATION:   The patient will need aggressive secondary prevention.  She needs to stop smoking.  We'll initiate beta blocker and statin therapy.  She will need dual antiplatelet therapy for at least a year.  She will followup with Dr. Mayford Knife.

## 2011-06-30 NOTE — Progress Notes (Signed)
ANTICOAGULATION CONSULT NOTE - Initial Consult  Pharmacy Consult for Heparin Indication: chest pain/ACS  No Known Allergies  Patient Measurements: Height: 5\' 8"  (172.7 cm) Weight: 230 lb (104.327 kg) IBW/kg (Calculated) : 63.9  Heparin Dosing Weight: 87 kg  Vital Signs: Temp: 98.4 F (36.9 C) (02/26 1054) Temp src: Oral (02/26 1054) BP: 113/65 mmHg (02/26 1300) Pulse Rate: 83  (02/26 1300)  Labs:  Basename 06/30/11 1204  HGB 14.6  HCT 42.2  PLT 281  APTT --  LABPROT --  INR --  HEPARINUNFRC --  CREATININE 0.70  CKTOTAL --  CKMB --  TROPONINI --   Estimated Creatinine Clearance: 98.1 ml/min (by C-G formula based on Cr of 0.7).  Medical History: Past Medical History  Diagnosis Date  . Hypertension   . Hyperlipemia   . Chronic pain   . Diabetes mellitus   . Degenerative joint disease   . Anxiety   . Migraine headache   . Hepatitis    Assessment:    Chest, back, and left arm pain last night.    Asprin 325 mg PO has been given.    Cardiology in with patient now.  Goal of Therapy:  Heparin level 0.3-0.7 units/ml   Plan:    Will begin Heparin with 4000 units IV bolus, Then 1200 units/hr infusion. Heparin level ~ 6 hrs after drip begins. Daily heparin level and CBC.   Dennie Fetters, RPh Pager: [phone] 06/30/2011,1:51 PM

## 2011-06-30 NOTE — Progress Notes (Signed)
Nursing: Patient reports increase SOB while laying in bed. Patient reports no chest pain, just getting short of breath. Patient reports having anxiety. Encouragement given. Clear lungs sounds present. No changes in heart sounds. Pulse good in all four extremities. Dr. Mayford Knife paged. 12 lead EKG obtained , no changed noted from comparison to previous 12 lead. Dr. Mayford Knife made aware of current assessment and 12 lead EKG. Orders obtained.

## 2011-06-30 NOTE — H&P (Signed)
Admit date: 06/30/2011 Primary MD:  Dr. Shaune Pollack Chief complaint/reason for admission:  Chest pain  HPI:   This is a 55yo WF with a history of HTN and dyslipidemia who presented to [facility] with complaints of chest pain.  Last PM while cleaning the house she developed substernal chest pressure across with left arm heaviness.  She took a baby ASA which helped her to fall asleep.  She awakened every hour with ongoing discomfort.  This am she felt better but still had some discomfort.  She showered and went to work and at work the nurse encouraged her to go to the ER.  On arrival in the ER the pain was a 3/10 in severity.  She was given SL NTG with significant improvement in her pain and currently it is a 1/10.  She denied any SOB, diaphoresis or nausea.      PMH:    Past Medical History  Diagnosis Date  . Hypertension   . Hyperlipemia   . Chronic pain   . Degenerative joint disease   . Anxiety   . Migraine headache   . Hepatitis     Hepatitis A    PSH:    Past Surgical History  Procedure Date  . Knee surgery   . Abdominal hysterectomy   . Abdominal surgery     removal of benign pelvic tumore  . Tmj arthroplasty   . Replacement total knee     ALLERGIES:   Review of patient's allergies indicates no known allergies.  Prior to Admit Meds:   (Not in a hospital admission) Family HX:    Family History  Problem Relation Age of Onset  . Cancer Father   . Heart failure Mother    Social HX:    History   Social History  . Marital Status: Single    Spouse Name: N/A    Number of Children: N/A  . Years of Education: N/A   Occupational History  . Not on file.   Social History Main Topics  . Smoking status: Current Everyday Smoker -- 0.8 packs/day for 30 years  . Smokeless tobacco: Not on file  . Alcohol Use: No  . Drug Use: No  . Sexually Active: Not on file   Other Topics Concern  . Not on file   Social History Narrative  . No narrative on file     ROS:  All 11 ROS were  addressed and are negative except what is stated in the HPI  PHYSICAL EXAM Filed Vitals:   06/30/11 1300  BP: 113/65  Pulse: 83  Temp:   Resp: 16   General: Well developed, well nourished, in no acute distress Head: Eyes PERRLA, No xanthomas.   Normal cephalic and atramatic  Lungs:   Clear bilaterally to auscultation and percussion. Heart:   HRRR S1 S2 Pulses are 2+ & equal.            No carotid bruit. No JVD.  No abdominal bruits. No femoral bruits. Abdomen: Bowel sounds are positive, abdomen soft and non-tender without masses  Extremities:   No clubbing, cyanosis or edema.  DP +1 Neuro: Alert and oriented X 3. Psych:  Good affect, responds appropriately   Labs:   Lab Results  Component Value Date   WBC 13.1* 06/30/2011   HGB 14.6 06/30/2011   HCT 42.2 06/30/2011   MCV 88.5 06/30/2011   PLT 281 06/30/2011    Lab 06/30/11 1204  NA 139  K 3.3*  CL 100  CO2 27  BUN 18  CREATININE 0.70  CALCIUM 10.1  PROT --  BILITOT --  ALKPHOS --  ALT --  AST --  GLUCOSE 112*     Lab Results  Component Value Date   INR 0.91 10/10/2010   INR 1.7* 11/25/2007   INR 1.7* 11/24/2007          Radiology:  *RADIOLOGY REPORT*  Clinical Data: Chest pain  CHEST - 2 VIEW  Comparison: 10/10/2010  Findings: Heart size and vascularity are normal. Negative for  pneumonia. Negative for mass or effusion. Lungs are clear.  IMPRESSION:  No acute cardiopulmonary disease.  Original Report Authenticated By: Camelia Phenes, M.D.   EKG:  NSR with scooped ST segments inferiorly and poor R wave progression in the anterior precordial leads, prolonged QTc  ASSESSMENT:  1.  NSTEMI with very mild residual chest pain.  EKG with nonspecific changes. 2.  HTN 3.  Dyslipidemia 4.  Hypokalemia 5.  Pronlonged QTc possibly secondary to hypokalemia  PLAN:   1.  Admit to step down 2.  Cycle cardiac enzymes 3.  IV Heparin gtt per pharmacy protocol 4.  IV NTG gtt 5.  ASA 6.  Continue statin/ACE I 7.   Lopressor 12.5mg  BID 8.  Replete potassium 9.  Will plan cath later today or in am when interventionalist available  Quintella Reichert, MD  06/30/2011  1:56 PM

## 2011-06-30 NOTE — ED Notes (Signed)
Pt reports SSCP improved from 3/10 down to 1/10 after NTG.  Respirations even & unlabored, no distress.  NSR on monitor without ectopy. Informed patient and/or family of status.

## 2011-06-30 NOTE — ED Notes (Signed)
Continues to rate CP 1/10. Increased NTG gtt to 23mcg/min Informed patient and/or family of status. Awaiting bed assignment.

## 2011-06-30 NOTE — ED Notes (Signed)
Patient reports onset of chest pain and back pain with left arm pain last night.  She states she feels tight all over. She denies nv/.  She had some sob with her pain last night.

## 2011-07-01 LAB — BASIC METABOLIC PANEL
BUN: 17 mg/dL (ref 6–23)
CO2: 28 mEq/L (ref 19–32)
Calcium: 9.1 mg/dL (ref 8.4–10.5)
Calcium: 9.6 mg/dL (ref 8.4–10.5)
Chloride: 105 mEq/L (ref 96–112)
Creatinine, Ser: 0.58 mg/dL (ref 0.50–1.10)
GFR calc Af Amer: >90 mL/min (ref >90)
GFR calc non Af Amer: >90 mL/min (ref >90)
Glucose, Bld: 97 mg/dL (ref 70–99)
Potassium: 3.1 mEq/L — ABNORMAL LOW (ref 3.5–5.1)
Sodium: 141 mEq/L (ref 135–145)

## 2011-07-01 LAB — CARDIAC PANEL(CRET KIN+CKTOT+MB+TROPI)
CK, MB: 168.7 ng/mL (ref 0.3–4.0)
CK, MB: 110.1 ng/mL (ref 0.3–4.0)
Relative Index: 10.5 — ABNORMAL HIGH (ref 0.0–2.5)
Troponin I: >25 ng/mL (ref <0.30)

## 2011-07-01 LAB — CBC
Hemoglobin: 12.7 g/dL (ref 12.0–15.0)
MCH: 29.8 pg (ref 26.0–34.0)
RBC: 4.26 MIL/uL (ref 3.87–5.11)
WBC: 10.8 10*3/uL — ABNORMAL HIGH (ref 4.0–10.5)

## 2011-07-01 LAB — PLATELET INHIBITION P2Y12: Platelet Function  P2Y12: 91 [PRU] — ABNORMAL LOW (ref 194–418)

## 2011-07-01 LAB — HEMOGLOBIN A1C
Hgb A1c MFr Bld: 6 % — ABNORMAL HIGH (ref <5.7)
Mean Plasma Glucose: 126 mg/dL — ABNORMAL HIGH (ref <117)

## 2011-07-01 MED ORDER — POTASSIUM CHLORIDE CRYS ER 20 MEQ PO TBCR
EXTENDED_RELEASE_TABLET | ORAL | Status: AC
  Filled 2011-07-01: qty 2

## 2011-07-01 MED ORDER — ISOSORBIDE MONONITRATE ER 30 MG PO TB24
30.0000 mg | ORAL_TABLET | Freq: Every day | ORAL | Status: DC
  Administered 2011-07-01 – 2011-07-02 (×2): 30 mg via ORAL
  Filled 2011-07-01 (×2): qty 1

## 2011-07-01 MED ORDER — POTASSIUM CHLORIDE CRYS ER 20 MEQ PO TBCR
40.0000 meq | EXTENDED_RELEASE_TABLET | Freq: Once | ORAL | Status: AC
  Administered 2011-07-01: 40 meq via ORAL

## 2011-07-01 MED ORDER — POTASSIUM CHLORIDE CRYS ER 20 MEQ PO TBCR: 20.0000 meq | EXTENDED_RELEASE_TABLET | Freq: Once | ORAL | Status: DC

## 2011-07-01 MED FILL — Dextrose Inj 5%: INTRAVENOUS | Qty: 50 | Status: AC

## 2011-07-01 NOTE — Consult Note (Signed)
Pt is a 3/4 ppd smoker who is in action stage and wants to quit. She is interested in using Chantix. Referred pt to MD for Rx. Discussed chantix use  Instructions and suggested to use for a minimum of 3 months and optimally for 6 months. Referred to 1-800 quit now for f/u and support. Discussed oral fixation substitutes, second hand smoke and in home smoking policy. Reviewed and gave pt Written education/contact information.

## 2011-07-01 NOTE — Progress Notes (Signed)
Nursing: Patient received xanax 0.25mg  PO at 2105. Patient continue to have c/o of SOB. Then reported c/o of discomfort upon inhalation. Patient was reassessed. No changed present in assessment. Held on TR band balloon deflation due to elevation in blood pressure. One sublingual NTG given for new chest pressure and elevated blood pressure. Morphine 2mg  IV given as well for discomfort after no relief from NTG.  Called the answering service to page Dr. Mayford Knife at [phone]. Return call from Dr. Mayford Knife at [phone]. Review patient's current assessment, vital signs and treatment given. Orders received to resume NTG infusion and call if any further changes. NTG infusion started.

## 2011-07-01 NOTE — Progress Notes (Signed)
Pt just walked 700 ft with RN. Only c/oback pain. Sts she only has SOB when she is falling asleep, feels like she has to gasp for breath. Began ed. Pt became sleepy so left sheets and will return tomorrow. Notified pt of Hgb A1C being 6.0. Sts she had a PCP appt for today and that she will reschedule that. [id number] [name] CES, ACSM

## 2011-07-01 NOTE — Progress Notes (Signed)
UR Completed. Simmons, Garritt Molyneux F [phone]  

## 2011-07-01 NOTE — Progress Notes (Addendum)
SUBJECTIVE:  Feeling better this am.  No further chest pain  OBJECTIVE:   Vitals:   Filed Vitals:   07/01/11 0630 07/01/11 0700 07/01/11 0800 07/01/11 0807  BP: 106/78 112/90 113/80   Pulse: 65 64 68   Temp:    97.7 F (36.5 C)  TempSrc:    Oral  Resp: 12 11 10    Height:      Weight:      SpO2: 97% 96% 96%    I&O's:   Intake/Output Summary (Last 24 hours) at 07/01/11 1610 Last data filed at 07/01/11 0800  Gross per 24 hour  Intake    473 ml  Output   1200 ml  Net   -727 ml   TELEMETRY: Reviewed telemetry pt in NSR     PHYSICAL EXAM General: Well developed, well nourished, in no acute distress Head: Eyes PERRLA, No xanthomas.   Normal cephalic and atramatic  Lungs:   Clear bilaterally to auscultation and percussion. Heart:   HRRR S1 S2 Pulses are 2+ & equal. Abdomen: Bowel sounds are positive, abdomen soft and non-tender without masses Extremities:   No clubbing, cyanosis or edema.  DP +1 Neuro: Alert and oriented X 3. Psych:  Good affect, responds appropriately   LABS: Basic Metabolic Panel:  Basename 06/30/11 2017 06/30/11 1310 06/30/11 1204  NA 139 -- 139  K 3.1* -- 3.3*  CL 101 -- 100  CO2 26 -- 27  GLUCOSE 99 -- 112*  BUN 15 -- 18  CREATININE 0.60 -- 0.70  CALCIUM 9.5 -- 10.1  MG -- 2.0 --  PHOS -- -- --   CBC:  Basename 06/30/11 2017 06/30/11 1204  WBC 11.1* 13.1*  NEUTROABS -- --  HGB 12.4 14.6  HCT 37.4 42.2  MCV 88.4 88.5  PLT 235 281   Cardiac Enzymes:  Basename 07/01/11 0210 06/30/11 2005 06/30/11 1214  CKTOTAL 1456* 1888* --  CKMB 168.7* 242.0* --  CKMBINDEX -- -- --  TROPONINI >25.00* >25.00* 14.61*   Hemoglobin A1C:  Basename 06/30/11 2017  HGBA1C 6.0*   Thyroid Function Tests:  Basename 06/30/11 1310  TSH 0.481  T4TOTAL --  T3FREE --  THYROIDAB --   Coag Panel:   Lab Results  Component Value Date   INR 1.24 06/30/2011   INR 1.02 06/30/2011   INR 0.91 10/10/2010    RADIOLOGY: Dg Chest 2 View  06/30/2011  *RADIOLOGY  REPORT*  Clinical Data: Chest pain  CHEST - 2 VIEW  Comparison: 10/10/2010  Findings: Heart size and vascularity are normal.  Negative for pneumonia.  Negative for mass or effusion.  Lungs are clear.  IMPRESSION: No acute cardiopulmonary disease.  Original Report Authenticated By: Camelia Phenes, M.D.      ASSESSMENT:  1. NSTEMI s/p cath with occluded mid LAD s/p PCI with DES x 2, mild disese in RCA, occluded branch off of OM1 and fills by left to left collaterals 2. Severe LV systolic dysfunction 3. Dyslipidemia  4. Hypokalemia  5. Pronlonged QTc possibly secondary to hypokalemia 6. Possible sleep apnea - nurses have noticed periods of apnea in her breathing with bradycardia during sleep    PLAN:   1.  Continue ASA/Brilanta/ACE/beta blocker/statin 2.  Wean off NTG gtt 3.  Add Imdur 30mg  daily 4.  recheck potassium and replete as needed 5.  BMET in am 6.  Transfer to tele bed 7.  Outpatient sleep study  Quintella Reichert, MD  07/01/2011  8:23 AM

## 2011-07-02 ENCOUNTER — Other Ambulatory Visit: Payer: Self-pay

## 2011-07-02 LAB — BASIC METABOLIC PANEL
BUN: 22 mg/dL (ref 6–23)
Chloride: 105 mEq/L (ref 96–112)
Creatinine, Ser: 0.65 mg/dL (ref 0.50–1.10)
Glucose, Bld: 123 mg/dL — ABNORMAL HIGH (ref 70–99)
Potassium: 3.4 mEq/L — ABNORMAL LOW (ref 3.5–5.1)

## 2011-07-02 LAB — CBC
HCT: 39.2 % (ref 36.0–46.0)
Hemoglobin: 12.9 g/dL (ref 12.0–15.0)
MCH: 29.5 pg (ref 26.0–34.0)
RBC: 4.38 MIL/uL (ref 3.87–5.11)

## 2011-07-02 MED ORDER — TICAGRELOR 90 MG PO TABS: 90.0000 mg | ORAL_TABLET | Freq: Two times a day (BID) | ORAL | Status: DC

## 2011-07-02 MED ORDER — ISOSORBIDE MONONITRATE ER 30 MG PO TB24: 30.0000 mg | ORAL_TABLET | Freq: Every day | ORAL | Status: DC

## 2011-07-02 MED ORDER — LISINOPRIL 20 MG PO TABS: 20.0000 mg | ORAL_TABLET | Freq: Every day | ORAL | Status: DC

## 2011-07-02 MED ORDER — POTASSIUM CHLORIDE CRYS ER 20 MEQ PO TBCR: 20.0000 meq | EXTENDED_RELEASE_TABLET | Freq: Three times a day (TID) | ORAL | Status: DC

## 2011-07-02 MED ORDER — NITROGLYCERIN 0.4 MG SL SUBL: 0.4000 mg | SUBLINGUAL_TABLET | SUBLINGUAL | Status: DC | PRN

## 2011-07-02 MED ORDER — METOPROLOL TARTRATE 12.5 MG HALF TABLET: 12.5000 mg | ORAL_TABLET | Freq: Two times a day (BID) | ORAL | Status: DC

## 2011-07-02 NOTE — Progress Notes (Signed)
Patient called, "I can't go to sleep."" I sleep few minutes and then wake up  Sob." Have notice patient snores and has sleep apnea. Pt. Stated, " My nurse told me that last night." 30-45 sec. ra sat 88-97% lungs clr.

## 2011-07-02 NOTE — Progress Notes (Signed)
   CARE MANAGEMENT NOTE 07/02/2011  Patient:  [name], [name]   Account Number:  [id number]  Date Initiated:  07/02/2011  Documentation initiated by:  GRAVES-BIGELOW,Virdia Ziesmer  Subjective/Objective Assessment:   Pt plan for d/c today on brilinta. Medication is available at [facility] on [address] and stated that pt will be able to use the 30 day free card.     Action/Plan:   No other assistance needed for CM.   Anticipated DC Date:  07/02/2011   Anticipated DC Plan:  HOME/SELF CARE      DC Planning Services  CM consult      Choice offered to / List presented to:             Status of service:  Completed, signed off Medicare Important Message given?   (If response is "NO", the following Medicare IM given date fields will be blank) Date Medicare IM given:   Date Additional Medicare IM given:    Discharge Disposition:  HOME/SELF CARE  Per UR Regulation:    Comments:

## 2011-07-02 NOTE — Progress Notes (Signed)
Pt. Discharged 07/02/2011  12:11 PM Discharge instructions reviewed with patient/family. Patient/family verbalized understanding. All Rx's given. Questions answered as needed. Pt. Discharged to home with family/self. Taken off unit via W/C. Asli Tokarski, [employer]

## 2011-07-02 NOTE — Discharge Instructions (Signed)
Acute Coronary Syndrome °Acute coronary syndrome (ACS) is an urgent problem in which the blood and oxygen supply to the heart is critically deficient. ACS requires hospitalization because one or more coronary arteries may be blocked. °ACS represents a range of conditions including: °· Previous angina that is now unstable, lasts longer, happens at rest, or is more intense.  °· A heart attack, with heart muscle cell injury and death.  °There are three vital coronary arteries that supply the heart muscle with blood and oxygen so that it can pump blood effectively. If blockages to these arteries develop, blood flow to the heart muscle is reduced. If the heart does not get enough blood, angina may occur as the first warning sign. °SYMPTOMS  °· The most common signs of angina include:  °· Tightness or squeezing in the chest.  °· Feeling of heaviness on the chest.  °· Discomfort in the arms, neck, or jaw.  °· Shortness of breath and nausea.  °· Cold, wet skin.  °· Angina is usually brought on by physical effort or excitement which increase the oxygen needs of the heart. These states increase the blood flow needs of the heart beyond what can be delivered.  °TREATMENT  °· Medicines to help discomfort may include nitroglycerin (nitro) in the form of tablets or a spray for rapid relief, or longer-acting forms such as cream, patches, or capsules. (Be aware that there are many side effects and possible interactions with other drugs).  °· Other medicines may be used to help the heart pump better.  °· Procedures to open blocked arteries including angioplasty or stent placement to keep the arteries open.  °· Open heart surgery may be needed when there are many blockages or they are in critical locations that are best treated with surgery.  °HOME CARE INSTRUCTIONS  °· Avoid smoking.  °· Take one baby or adult aspirin daily, if your caregiver advises. This helps reduce the risk of a heart attack.  °· It is very important that you  follow the angina treatment prescribed by your caregiver. Make arrangements for proper follow-up care.  °· Eat a heart healthy diet with salt and fat restrictions as advised.  °· Regular exercise is good for you as long as it does not cause discomfort. Do not begin any new type of exercise until you check with your caregiver.  °· If you are overweight, you should lose weight.  °· Try to maintain normal blood lipid levels.  °· Keep your blood pressure under control as recommended by your caregiver.  °· You should tell your caregiver right away about any increase in the severity or frequency of your chest discomfort or angina attacks. When you have angina, you should stop what you are doing and sit down. This may bring relief in 3 to 5 minutes. If your caregiver has prescribed nitro, take it as directed.  °· If your caregiver has given you a follow-up appointment, it is very important to keep that appointment. Not keeping the appointment could result in a chronic or permanent injury, pain, and disability. If there is any problem keeping the appointment, you must call back to this facility for assistance.  °SEEK IMMEDIATE MEDICAL CARE IF:  °· You develop nausea, vomiting, or shortness of breath.  °· You feel faint, lightheaded, or pass out.  °· Your chest discomfort gets worse.  °· You are sweating or experience sudden profound fatigue.  °· You do not get relief of your chest pain after 3 doses   of nitro.   Your discomfort lasts longer than 15 minutes.  MAKE SURE YOU:   Understand these instructions.   Will watch your condition.   Will get help right away if you are not doing well or get worse.  Document Released: 04/20/2005 Document Revised: 12/31/2010 Document Reviewed: 11/22/2007 [facility] Patient Information 2012 [place], [place].

## 2011-07-02 NOTE — Discharge Planning (Signed)
Patient ID: [name] MRN: [id number] DOB/AGE: 58/26/1957 55 y.o.   Admit date: 06/30/2011 Discharge date: 07/02/2011   Primary Discharge Diagnosis  NSTEMI Secondary Discharge Diagnosis             CAD s/p PCI x2 with DES to mid LAD, occluded branch of OM             HTN             Dyslipidemia             Chronic pain             DJD             Anxiety             Migraine headaches             Hepatitis A   Significant Diagnostic Studies: angiography: Cardiac cath with PCI   Consults: NONE   Hospital Course:   This is a 55yo WF with a history of HTN, dyslipidemia, anxiety who presented to the ER with compliants of chest pain.  Her pain started the night before her admission and on arrival in the ER she was given NTG which improved her pain.  She ruled in for NSTEMI and due to ongoing low grade CP she went to the cath lab.  Cath demonstrated mild to moderate proximal LAD disease and the mid vessel was occluded.  THere was collateral filling of the distal LAD from the RCA and the diagonal filled by left to left collaterals.  The left circumflex was widely patent.  There was a branch from the small OM1 occluded and filled by left to left collaterals.  The RCA was patent.  EF was moderately reduced at 30%.  She undewert DES to the LAD x 2 and post cath had recurrent pain.  Her NTG gtt was eventually weaned and she was started on Imdur 30mg daily.  Due to persistent hypokalemia her Lisinopril HCT was stopped and she was placed on Lisinopril.  Her potassium was repleted.  She was kept on Lasix due to LV dysfunction.  SHe was started on low dose beta blockade.  She was noted during her hospital stay to have periods of apnea during sleep with O2 desats.  On the day of discharge she was ambulating without chest pain or SOB.     Discharge Exam: Blood pressure 101/64, pulse 75, temperature 98.4 F (36.9 C), temperature source Oral, resp. rate 19, height 5' 8" (1.727 m), weight 110 kg (242  lb 8.1 oz), SpO2 92.00%.  WD, WN, WF in NAD HEENT:  benign NECK:  Supple, no LAD, no bruit LUNG:  CTA COR:  RRR no M/R/G ABD:  Soft NT, ND, active BS EXT:  No C/E/E Labs:   Lab Results   Component  Value  Date     WBC  10.0  07/02/2011     HGB  12.9  07/02/2011     HCT  39.2  07/02/2011     MCV  89.5  07/02/2011     PLT  225  07/02/2011    Lab  07/02/11 0700   NA  141   K  3.4*   CL  105   CO2  27   BUN  22   CREATININE  0.65   CALCIUM  9.4   PROT  --   BILITOT  --   ALKPHOS  --   ALT  --   AST  --     GLUCOSE  123*    Lab Results   Component  Value  Date     CKTOTAL  1047*  07/01/2011     CKMB  110.1*  07/01/2011     TROPONINI  >25.00*  07/01/2011        Radiology:  *RADIOLOGY REPORT*   Clinical Data: Chest pain   CHEST - 2 VIEW   Comparison: 10/10/2010   Findings: Heart size and vascularity are normal. Negative for   pneumonia. Negative for mass or effusion. Lungs are clear.   IMPRESSION:   No acute cardiopulmonary disease.   Original Report Authenticated By: DAVID C. CLARK, M.D.   EKG:  NS with T wave inversions in the anterior leads due to recent MI   FOLLOW UP PLANS AND APPOINTMENTS Discharge Orders      Future Orders  Please Complete By  Expires     Diet - low sodium heart healthy          Increase activity slowly          Driving Restrictions          Comments:     No driving for 2 weeks until seen by my nurse practitioner     Lifting restrictions          Comments:     No lifting more than 10 pounds for 2 weeks     Call MD for:  difficulty breathing, headache or visual disturbances          Call MD for:  persistant dizziness or light-headedness          Call MD for:  extreme fatigue            Medication List   As of 07/02/2011  9:55 AM     STOP taking these medications              lisinopril-hydrochlorothiazide 20-25 MG per tablet                TAKE these medications              ARIPiprazole 5 MG tablet     Commonly known as: ABILIFY      Take 5 mg by mouth daily.           DULoxetine 30 MG capsule     Commonly known as: CYMBALTA     Take 90 mg by mouth daily.           fentaNYL 50 MCG/HR     Commonly known as: DURAGESIC - dosed mcg/hr     Place 1 patch onto the skin every other day.           furosemide 80 MG tablet     Commonly known as: LASIX     Take 80 mg by mouth daily.           ibuprofen 200 MG tablet     Commonly known as: ADVIL,MOTRIN     Take 800 mg by mouth 2 (two) times daily as needed. For pain           isosorbide mononitrate 30 MG 24 hr tablet     Commonly known as: IMDUR     Take 1 tablet (30 mg total) by mouth daily.           lisinopril 20 MG tablet     Commonly known as: PRINIVIL,ZESTRIL     Take 1 tablet (20 mg total) by mouth daily.       

## 2011-07-08 NOTE — Discharge Summary (Signed)
Patient ID: [name] MRN: [id number] DOB/AGE: [birth year] 56 y.o.   Admit date: 06/30/2011 Discharge date: 07/02/2011   Primary Discharge Diagnosis  NSTEMI Secondary Discharge Diagnosis             CAD s/p PCI x2 with DES to mid LAD, occluded branch of OM             HTN             Dyslipidemia             Chronic pain             DJD             Anxiety             Migraine headaches             Hepatitis A   Significant Diagnostic Studies: angiography: Cardiac cath with PCI   Consults: [facility] Hospital Course:   This is a 55yo WF with a history of HTN, dyslipidemia, anxiety who presented to the ER with compliants of chest pain.  Her pain started the night before her admission and on arrival in the ER she was given NTG which improved her pain.  She ruled in for NSTEMI and due to ongoing low grade CP she went to the cath lab.  Cath demonstrated mild to moderate proximal LAD disease and the mid vessel was occluded.  THere was collateral filling of the distal LAD from the RCA and the diagonal filled by left to left collaterals.  The left circumflex was widely patent.  There was a branch from the small OM1 occluded and filled by left to left collaterals.  The RCA was patent.  EF was moderately reduced at 30%.  She undewert DES to the LAD x 2 and post cath had recurrent pain.  Her NTG gtt was eventually weaned and she was started on Imdur 30mg  daily.  Due to persistent hypokalemia her Lisinopril HCT was stopped and she was placed on Lisinopril.  Her potassium was repleted.  She was kept on Lasix due to LV dysfunction.  SHe was started on low dose beta blockade.  She was noted during her hospital stay to have periods of apnea during sleep with O2 desats.  On the day of discharge she was ambulating without chest pain or SOB.     Discharge Exam: Blood pressure 101/64, pulse 75, temperature 98.4 F (36.9 C), temperature source Oral, resp. rate 19, height 5\' 8"  (1.727 m), weight 110 kg (242  lb 8.1 oz), SpO2 92.00%.  WD, WN, WF in NAD HEENT:  benign NECK:  Supple, no LAD, no bruit LUNG:  CTA COR:  RRR no M/R/G ABD:  Soft NT, ND, active BS EXT:  No C/E/E Labs:   Lab Results   Component  Value  Date     WBC  10.0  07/02/2011     HGB  12.9  07/02/2011     HCT  39.2  07/02/2011     MCV  89.5  07/02/2011     PLT  225  07/02/2011    Lab  07/02/11 0700   NA  141   K  3.4*   CL  105   CO2  27   BUN  22   CREATININE  0.65   CALCIUM  9.4   PROT  --   BILITOT  --   ALKPHOS  --   ALT  --   AST  --  GLUCOSE  123*    Lab Results   Component  Value  Date     CKTOTAL  1047*  07/01/2011     CKMB  110.1*  07/01/2011     TROPONINI  >25.00*  07/01/2011        Radiology:  *RADIOLOGY REPORT*   Clinical Data: Chest pain   CHEST - 2 VIEW   Comparison: 10/10/2010   Findings: Heart size and vascularity are normal. Negative for   pneumonia. Negative for mass or effusion. Lungs are clear.   IMPRESSION:   No acute cardiopulmonary disease.   Original Report Authenticated By: Camelia Phenes, M.D.   EKG:  NS with T wave inversions in the anterior leads due to recent MI   FOLLOW UP PLANS AND APPOINTMENTS Discharge Orders      Future Orders  Please Complete By  Expires     Diet - low sodium heart healthy          Increase activity slowly          Driving Restrictions          Comments:     No driving for 2 weeks until seen by my nurse practitioner     Lifting restrictions          Comments:     No lifting more than 10 pounds for 2 weeks     Call MD for:  difficulty breathing, headache or visual disturbances          Call MD for:  persistant dizziness or light-headedness          Call MD for:  extreme fatigue            Medication List   As of 07/02/2011  9:55 AM     STOP taking these medications              lisinopril-hydrochlorothiazide 20-25 MG per tablet                TAKE these medications              ARIPiprazole 5 MG tablet     Commonly known as: ABILIFY      Take 5 mg by mouth daily.           DULoxetine 30 MG capsule     Commonly known as: CYMBALTA     Take 90 mg by mouth daily.           fentaNYL 50 MCG/HR     Commonly known as: DURAGESIC - dosed mcg/hr     Place 1 patch onto the skin every other day.           furosemide 80 MG tablet     Commonly known as: LASIX     Take 80 mg by mouth daily.           ibuprofen 200 MG tablet     Commonly known as: ADVIL,MOTRIN     Take 800 mg by mouth 2 (two) times daily as needed. For pain           isosorbide mononitrate 30 MG 24 hr tablet     Commonly known as: IMDUR     Take 1 tablet (30 mg total) by mouth daily.           lisinopril 20 MG tablet     Commonly known as: PRINIVIL,ZESTRIL     Take 1 tablet (20 mg total) by mouth daily.  LUNESTA PO     Take 1 tablet by mouth at bedtime.           metoprolol tartrate 12.5 mg Tabs     Commonly known as: LOPRESSOR     Take 0.5 tablets (12.5 mg total) by mouth 2 (two) times daily.           mulitivitamin with minerals Tabs     Take 1 tablet by mouth daily.           nitroGLYCERIN 0.4 MG SL tablet     Commonly known as: NITROSTAT     Place 1 tablet (0.4 mg total) under the tongue every 5 (five) minutes as needed for chest pain.           potassium chloride SA 20 MEQ tablet     Commonly known as: K-DUR,KLOR-CON     Take 1 tablet (20 mEq total) by mouth 3 (three) times daily.           SIMVASTATIN PO     Take 1 tablet by mouth daily.           Ticagrelor 90 MG Tabs tablet     Commonly known as: BRILINTA     Take 1 tablet (90 mg total) by mouth 2 (two) times daily.                  Follow-up Information      Follow up with FERGUSON,CYNTHIA A, NP on 07/17/2011. (at 9:30am )      Contact information:     [facility]  [address], [address] [place] [address] [phone]      Patient to have BMET check on  07/03/2011 in my office           BRING ALL  MEDICATIONS WITH YOU TO FOLLOW UP APPOINTMENTS   Time spent with patient to include physician time:  35 minutes Signed: Quintella Reichert 07/02/2011, 9:55 AM

## 2011-12-02 ENCOUNTER — Ambulatory Visit (INDEPENDENT_AMBULATORY_CARE_PROVIDER_SITE_OTHER): Payer: Medicare Other | Admitting: Licensed Clinical Social Worker

## 2011-12-02 DIAGNOSIS — F331 Major depressive disorder, recurrent, moderate: Secondary | ICD-10-CM

## 2011-12-02 DIAGNOSIS — F410 Panic disorder [episodic paroxysmal anxiety] without agoraphobia: Secondary | ICD-10-CM

## 2011-12-16 ENCOUNTER — Ambulatory Visit (INDEPENDENT_AMBULATORY_CARE_PROVIDER_SITE_OTHER): Payer: Medicare Other | Admitting: Licensed Clinical Social Worker

## 2011-12-16 DIAGNOSIS — F410 Panic disorder [episodic paroxysmal anxiety] without agoraphobia: Secondary | ICD-10-CM

## 2011-12-16 DIAGNOSIS — F331 Major depressive disorder, recurrent, moderate: Secondary | ICD-10-CM

## 2011-12-30 ENCOUNTER — Ambulatory Visit (INDEPENDENT_AMBULATORY_CARE_PROVIDER_SITE_OTHER): Payer: Medicare Other | Admitting: Licensed Clinical Social Worker

## 2011-12-30 DIAGNOSIS — F410 Panic disorder [episodic paroxysmal anxiety] without agoraphobia: Secondary | ICD-10-CM

## 2011-12-30 DIAGNOSIS — F331 Major depressive disorder, recurrent, moderate: Secondary | ICD-10-CM

## 2012-01-13 ENCOUNTER — Ambulatory Visit (INDEPENDENT_AMBULATORY_CARE_PROVIDER_SITE_OTHER): Payer: Medicare Other | Admitting: Licensed Clinical Social Worker

## 2012-01-13 DIAGNOSIS — F410 Panic disorder [episodic paroxysmal anxiety] without agoraphobia: Secondary | ICD-10-CM

## 2012-01-13 DIAGNOSIS — F331 Major depressive disorder, recurrent, moderate: Secondary | ICD-10-CM

## 2012-01-27 ENCOUNTER — Ambulatory Visit: Payer: Medicare Other | Admitting: Licensed Clinical Social Worker

## 2012-01-28 ENCOUNTER — Encounter (HOSPITAL_COMMUNITY): Payer: Self-pay | Admitting: Emergency Medicine

## 2012-01-28 ENCOUNTER — Emergency Department (HOSPITAL_COMMUNITY): Payer: Medicare Other

## 2012-01-28 ENCOUNTER — Emergency Department (HOSPITAL_COMMUNITY)
Admission: EM | Admit: 2012-01-28 | Discharge: 2012-01-28 | Disposition: A | Discharge status: Home / Self Care | Payer: Medicare Other | Attending: Emergency Medicine | Admitting: Emergency Medicine

## 2012-01-28 DIAGNOSIS — Z9861 Coronary angioplasty status: Secondary | ICD-10-CM | POA: Insufficient documentation

## 2012-01-28 DIAGNOSIS — I2510 Atherosclerotic heart disease of native coronary artery without angina pectoris: Secondary | ICD-10-CM | POA: Insufficient documentation

## 2012-01-28 DIAGNOSIS — I209 Angina pectoris, unspecified: Secondary | ICD-10-CM | POA: Insufficient documentation

## 2012-01-28 DIAGNOSIS — R079 Chest pain, unspecified: Secondary | ICD-10-CM

## 2012-01-28 DIAGNOSIS — R609 Edema, unspecified: Secondary | ICD-10-CM | POA: Insufficient documentation

## 2012-01-28 HISTORY — DX: Sleep apnea, unspecified: G47.30

## 2012-01-28 LAB — BASIC METABOLIC PANEL
BUN: 16 mg/dL (ref 6–23)
Creatinine, Ser: 0.7 mg/dL (ref 0.50–1.10)
GFR calc Af Amer: >90 mL/min (ref >90)
GFR calc non Af Amer: >90 mL/min (ref >90)
Potassium: 3.9 mEq/L (ref 3.5–5.1)

## 2012-01-28 LAB — CBC
HCT: 39.2 % (ref 36.0–46.0)
MCHC: 33.4 g/dL (ref 30.0–36.0)
Platelets: 213 10*3/uL (ref 150–400)
RDW: 12.7 % (ref 11.5–15.5)

## 2012-01-28 MED ORDER — ASPIRIN 81 MG PO CHEW
324.0000 mg | CHEWABLE_TABLET | Freq: Once | ORAL | Status: AC
  Administered 2012-01-28: 324 mg via ORAL
  Filled 2012-01-28: qty 4

## 2012-01-28 MED ORDER — ACETAMINOPHEN 325 MG PO TABS
650.0000 mg | ORAL_TABLET | Freq: Once | ORAL | Status: AC
  Administered 2012-01-28: 650 mg via ORAL
  Filled 2012-01-28: qty 2

## 2012-01-28 NOTE — ED Provider Notes (Signed)
56 year old female with history of coronary disease and 2 stents placed had an onset this afternoon of intrascapular pressure which is typical of her angina. This came on at rest and was completely relieved with 2 nitroglycerin. Her cardiac pain usually comes on at rest and this episode was not any different from any of the other episodes. She denies dyspnea, nausea, diaphoresis. ECG shows no acute changes. Her exam is significant for peripheral edema. The her employer had insisted that she come to the hospital. However, this seems to be an episode of stable angina and does not require any further workup. Arrangements are made for her to followup with her cardiologist   Date: 01/28/2012  Rate: 73  Rhythm: normal sinus rhythm  QRS Axis: normal  Intervals: normal  ST/T Wave abnormalities: normal  Conduction Disutrbances:none  Narrative Interpretation: Probable old anteroseptal myocardial infarction. When compared with ECG of 07/02/2011, there's been resolution of T-wave inversion.  Old EKG Reviewed: changes noted  .  I saw and evaluated the patient, reviewed the resident's note and I agree with the findings and plan.   Dione Booze, MD 01/28/12 [phone]

## 2012-01-28 NOTE — ED Notes (Addendum)
Pt reports that this AM had chest pressure--took nitro--relieved pain--but pain came back about 2pm--took another nitro--reports has resolved CP again; denies n/v/sob/diaphoresis; pt had MI in feb-reports similar pain; cards MD Dr Mayford Knife

## 2012-01-28 NOTE — ED Notes (Signed)
i-stat troponin results=.01 

## 2012-01-28 NOTE — ED Provider Notes (Signed)
History     CSN: [id number]  Arrival date & time 01/28/12  1550   First MD Initiated Contact with Patient 01/28/12 1642      Chief Complaint  Patient presents with  . Chest Pain    (Consider location/radiation/quality/duration/timing/severity/associated sxs/prior treatment) Patient is a 56 y.o. female presenting with chest pain. The history is provided by the patient.  Chest Pain The chest pain began 3 - 5 hours ago. Chest pain occurs constantly. The chest pain is resolved. The severity of the pain is moderate. The quality of the pain is described as dull and heavy. The pain radiates to the mid back. Pertinent negatives for primary symptoms include no fever, no fatigue, no syncope, no shortness of breath, no cough, no wheezing, no palpitations, no abdominal pain, no nausea, no vomiting, no dizziness and no altered mental status. She tried nitroglycerin (completely resolved after second dose) for the symptoms.  Her past medical history is significant for MI.     Past Medical History  Diagnosis Date  . Hypertension   . Hyperlipemia   . Chronic pain     "back and legs"  . Degenerative joint disease   . Anxiety   . Migraine headache   . Hepatitis     Hepatitis A  . Angina   . Myocardial infarction 06/29/11    "first one"  . Pneumonia   . Depression   . Sleep apnea   . CPAP (continuous positive airway pressure) dependence     Past Surgical History  Procedure Date  . Abdominal hysterectomy   . Abdominal surgery     removal of benign pelvic tumore  . Tmj arthroplasty   . Replacement total knee   . Joint replacement     bilateral knee replacements  . Knee surgery     "multiple knee scopes and OR bilaterally; final outcome bilateral knee replacements"  . Tonsillectomy and adenoidectomy   . Coronary angioplasty with stent placement     Family History  Problem Relation Age of Onset  . Cancer Father   . Heart failure Mother     History  Substance Use Topics  .  Smoking status: Current Every Day Smoker -- 0.8 packs/day for 30 years    Types: Cigarettes  . Smokeless tobacco: Never Used   Comment: smoking consult entered 06/30/11 1503  . Alcohol Use: No    OB History    Grav Para Term Preterm Abortions TAB SAB Ect Mult Living                  Review of Systems  Constitutional: Negative for fever and fatigue.  Respiratory: Negative for cough, shortness of breath and wheezing.   Cardiovascular: Positive for chest pain. Negative for palpitations and syncope.  Gastrointestinal: Negative for nausea, vomiting, abdominal pain and diarrhea.  Neurological: Negative for dizziness and headaches.  Psychiatric/Behavioral: Negative for altered mental status.  All other systems reviewed and are negative.    Allergies  Review of patient's allergies indicates no known allergies.  Home Medications   Current Outpatient Rx  Name Route Sig Dispense Refill  . ATORVASTATIN CALCIUM 80 MG PO TABS Oral Take 80 mg by mouth daily.    . FENTANYL 50 MCG/HR TD PT72 Transdermal Place 1 patch onto the skin every other day.    Marland Kitchen FLUOXETINE HCL 40 MG PO CAPS Oral Take 40 mg by mouth daily.    . FUROSEMIDE 80 MG PO TABS Oral Take 80 mg by mouth daily.    Marland Kitchen  IBUPROFEN 200 MG PO TABS Oral Take 800 mg by mouth 2 (two) times daily as needed. For pain    . ISOSORBIDE MONONITRATE ER 30 MG PO TB24 Oral Take 1 tablet (30 mg total) by mouth daily. 30 tablet 11  . LISINOPRIL 20 MG PO TABS Oral Take 1 tablet (20 mg total) by mouth daily. 30 tablet 11  . METOPROLOL TARTRATE 12.5 MG HALF TABLET Oral Take 0.5 tablets (12.5 mg total) by mouth 2 (two) times daily. 30 tablet 11  . ADULT MULTIVITAMIN W/MINERALS CH Oral Take 1 tablet by mouth daily.    Marland Kitchen NITROGLYCERIN 0.4 MG SL SUBL Sublingual Place 1 tablet (0.4 mg total) under the tongue every 5 (five) minutes as needed for chest pain. 25 tablet 5  . POTASSIUM CHLORIDE CRYS ER 20 MEQ PO TBCR Oral Take 1 tablet (20 mEq total) by mouth 3  (three) times daily. 90 tablet 11  . PRASUGREL HCL 10 MG PO TABS Oral Take 10 mg by mouth daily.      BP 131/64  Pulse 74  Temp 98 F (36.7 C) (Oral)  Resp 20  SpO2 96%  Physical Exam  Nursing note and vitals reviewed. Constitutional: She is oriented to person, place, and time. She appears well-developed and well-nourished. No distress.  HENT:  Head: Normocephalic and atraumatic.  Eyes: EOM are normal. Pupils are equal, round, and reactive to light.  Neck: Normal range of motion.  Cardiovascular: Normal rate and normal heart sounds.   Pulmonary/Chest: Effort normal and breath sounds normal. No respiratory distress.  Abdominal: Soft. She exhibits no distension. There is no tenderness.  Musculoskeletal: Normal range of motion.  Neurological: She is alert and oriented to person, place, and time.  Skin: Skin is warm and dry.    ED Course  Procedures (including critical care time)  Labs Reviewed  CBC - Abnormal; Notable for the following:    WBC 10.7 (*)     All other components within normal limits  BASIC METABOLIC PANEL   Dg Chest 2 View  01/28/2012  *RADIOLOGY REPORT*  Clinical Data: Chest pain.  The prior myocardial infarct. Hypertension.  CHEST - 2 VIEW  Comparison: 06/30/2011  Findings: Heart size is normal.  Both lungs are clear.  No evidence of pleural effusion.  No mass or lymphadenopathy identified.  A coin is seen overlying the left lung base which is located outside of the patient.  IMPRESSION: No active disease.   Original Report Authenticated By: Danae Orleans, M.D.      1. Chest pain       MDM  4:53 PM Pt seen and examined. Pt with chest tightness similar to MI in Feb 2013. It resolved with 2 NTG but her work encouraged her to be evaluated. Pain occurred around 1 PM. I-stat troponin normal, but will send formal as well. Doubt acute MI as patient is back to baseline and no EKG changes.   Troponin normal. Pt to follow with cardiology tomorrow (called to notify  on-call).         Daleen Bo, MD 01/29/12 [phone]

## 2012-01-29 ENCOUNTER — Encounter (HOSPITAL_COMMUNITY)
Admission: RE | Admit: 2012-01-29 | Discharge: 2012-01-29 | Disposition: A | Discharge status: Home / Self Care | Payer: Medicare Other | Source: Ambulatory Visit | Attending: Cardiology | Admitting: Cardiology

## 2012-01-29 LAB — POCT I-STAT TROPONIN I

## 2012-02-01 ENCOUNTER — Encounter (HOSPITAL_COMMUNITY): Payer: Medicare Other

## 2012-02-02 ENCOUNTER — Ambulatory Visit: Payer: Medicare Other | Admitting: Licensed Clinical Social Worker

## 2012-02-03 ENCOUNTER — Encounter (HOSPITAL_COMMUNITY): Payer: Medicare Other

## 2012-02-05 ENCOUNTER — Encounter (HOSPITAL_COMMUNITY): Payer: Medicare Other

## 2012-02-08 ENCOUNTER — Encounter (HOSPITAL_COMMUNITY)
Admission: RE | Admit: 2012-02-08 | Discharge: 2012-02-08 | Disposition: A | Discharge status: Home / Self Care | Payer: Medicare Other | Source: Ambulatory Visit | Attending: Cardiology | Admitting: Cardiology

## 2012-02-08 ENCOUNTER — Encounter (HOSPITAL_COMMUNITY): Payer: Self-pay

## 2012-02-08 DIAGNOSIS — I2510 Atherosclerotic heart disease of native coronary artery without angina pectoris: Secondary | ICD-10-CM | POA: Insufficient documentation

## 2012-02-08 DIAGNOSIS — Z9861 Coronary angioplasty status: Secondary | ICD-10-CM | POA: Insufficient documentation

## 2012-02-08 DIAGNOSIS — I252 Old myocardial infarction: Secondary | ICD-10-CM | POA: Insufficient documentation

## 2012-02-08 DIAGNOSIS — Z5189 Encounter for other specified aftercare: Secondary | ICD-10-CM | POA: Insufficient documentation

## 2012-02-08 NOTE — Progress Notes (Signed)
Pt started cardiac rehab today.  Pt tolerated light exercise without difficulty.  Asymptomatic. VSS, telemetry-sinus rhythm, NSSTT wave changes, unifocal PVC.  Dr. Mayford Knife notified of EKG changes.  Pt oriented to exercise equipment and routine.  Understanding verbalized.

## 2012-02-10 ENCOUNTER — Encounter (HOSPITAL_COMMUNITY)
Admission: RE | Admit: 2012-02-10 | Discharge: 2012-02-10 | Disposition: A | Discharge status: Home / Self Care | Payer: Medicare Other | Source: Ambulatory Visit | Attending: Cardiology | Admitting: Cardiology

## 2012-02-10 ENCOUNTER — Ambulatory Visit (INDEPENDENT_AMBULATORY_CARE_PROVIDER_SITE_OTHER): Payer: Medicare Other | Admitting: Licensed Clinical Social Worker

## 2012-02-10 DIAGNOSIS — F410 Panic disorder [episodic paroxysmal anxiety] without agoraphobia: Secondary | ICD-10-CM

## 2012-02-10 DIAGNOSIS — F331 Major depressive disorder, recurrent, moderate: Secondary | ICD-10-CM

## 2012-02-12 ENCOUNTER — Encounter (HOSPITAL_COMMUNITY)
Admission: RE | Admit: 2012-02-12 | Discharge: 2012-02-12 | Disposition: A | Discharge status: Home / Self Care | Payer: Medicare Other | Source: Ambulatory Visit | Attending: Cardiology | Admitting: Cardiology

## 2012-02-15 ENCOUNTER — Encounter (HOSPITAL_COMMUNITY): Payer: Medicare Other

## 2012-02-17 ENCOUNTER — Encounter (HOSPITAL_COMMUNITY)
Admission: RE | Admit: 2012-02-17 | Discharge: 2012-02-17 | Disposition: A | Discharge status: Home / Self Care | Payer: Medicare Other | Source: Ambulatory Visit | Attending: Cardiology | Admitting: Cardiology

## 2012-02-19 ENCOUNTER — Encounter (HOSPITAL_COMMUNITY)
Admission: RE | Admit: 2012-02-19 | Discharge: 2012-02-19 | Disposition: A | Discharge status: Home / Self Care | Payer: Medicare Other | Source: Ambulatory Visit | Attending: Cardiology | Admitting: Cardiology

## 2012-02-22 ENCOUNTER — Encounter (HOSPITAL_COMMUNITY): Payer: Medicare Other

## 2012-02-24 ENCOUNTER — Ambulatory Visit: Payer: Medicare Other | Admitting: Licensed Clinical Social Worker

## 2012-02-24 ENCOUNTER — Encounter (HOSPITAL_COMMUNITY)
Admission: RE | Admit: 2012-02-24 | Discharge: 2012-02-24 | Disposition: A | Discharge status: Home / Self Care | Payer: Medicare Other | Source: Ambulatory Visit | Attending: Cardiology | Admitting: Cardiology

## 2012-02-26 ENCOUNTER — Encounter (HOSPITAL_COMMUNITY)
Admission: RE | Admit: 2012-02-26 | Discharge: 2012-02-26 | Disposition: A | Discharge status: Home / Self Care | Payer: Medicare Other | Source: Ambulatory Visit | Attending: Cardiology | Admitting: Cardiology

## 2012-02-29 ENCOUNTER — Encounter (HOSPITAL_COMMUNITY): Payer: Medicare Other

## 2012-03-02 ENCOUNTER — Encounter (HOSPITAL_COMMUNITY)
Admission: RE | Admit: 2012-03-02 | Discharge: 2012-03-02 | Disposition: A | Discharge status: Home / Self Care | Payer: Medicare Other | Source: Ambulatory Visit | Attending: Cardiology | Admitting: Cardiology

## 2012-03-02 NOTE — Progress Notes (Signed)
[name] 56 y.o. female Nutrition Note Spoke with pt. Pt reluctant to participate in encounter. Pt states she eats out 4-6 meals/week. Pt unwilling to discuss where she eats out. Nutrition Plan and cholesterol goals reviewed with pt. Pt has not returned her [employer] nutrition survey. Pt states "it's at home, I'll bring it next time." Pt wants to lose wt. Pt has been trying to lose wt by "watching calories." Wt loss tips reviewed.  According to pt's A1c, pt is pre-diabetic. Pre-diabetes discussed. Per nutrition screen, pt c/o constipation. Per discussion, pt feels constipation related to pain medications used. Pt reports drinking "plenty of fluids" and "eating high fiber food." Pt expressed understanding of the information reviewed. Pt aware of nutrition education classes offered and is unable to attend nutrition classes.  Nutrition Diagnosis   Food-and nutrition-related knowledge deficit related to lack of interest in and exposure to information as related to diagnosis of: ? CVD ? Pre-DM (A1c 6.0)     Obesity related to excessive energy intake as evidenced by a BMI of 41.8  Nutrition RX/ Estimated Daily Nutrition Needs for: wt loss  1450-1950 Kcal, 40-50 gm fat, 9-15 gm sat fat, 1.4-1.9 gm trans-fat, <1500 mg sodium   Nutrition Intervention   Pt's individual nutrition plan including cholesterol goals reviewed with pt.   Pt to attend the Portion Distortion class   Handouts given for:  ? Nutrition I class                              ? Nutrition II class        Pt given handouts for: ? pre-diabetes ? High Fiber Nutrition Therapy   Continue client-centered nutrition education by RD, as part of interdisciplinary care. Goal(s)   Pt to identify and limit food sources of saturated fat, trans fat, and cholesterol   Pt to identify food quantities necessary to achieve: ? wt loss to a goal wt of 228-240 lb (104-109.4 kg) at graduation from cardiac rehab.  Monitor and Evaluate progress toward nutrition  goal with team. Nutrition Risk: Low

## 2012-03-04 ENCOUNTER — Encounter (HOSPITAL_COMMUNITY)
Admission: RE | Admit: 2012-03-04 | Discharge: 2012-03-04 | Disposition: A | Discharge status: Home / Self Care | Payer: Medicare Other | Source: Ambulatory Visit | Attending: Cardiology | Admitting: Cardiology

## 2012-03-04 DIAGNOSIS — I2510 Atherosclerotic heart disease of native coronary artery without angina pectoris: Secondary | ICD-10-CM | POA: Insufficient documentation

## 2012-03-04 DIAGNOSIS — Z5189 Encounter for other specified aftercare: Secondary | ICD-10-CM | POA: Insufficient documentation

## 2012-03-04 DIAGNOSIS — I252 Old myocardial infarction: Secondary | ICD-10-CM | POA: Insufficient documentation

## 2012-03-04 DIAGNOSIS — Z9861 Coronary angioplasty status: Secondary | ICD-10-CM | POA: Insufficient documentation

## 2012-03-07 ENCOUNTER — Encounter (HOSPITAL_COMMUNITY): Payer: Medicare Other

## 2012-03-09 ENCOUNTER — Encounter (HOSPITAL_COMMUNITY)
Admission: RE | Admit: 2012-03-09 | Discharge: 2012-03-09 | Disposition: A | Discharge status: Home / Self Care | Payer: Medicare Other | Source: Ambulatory Visit | Attending: Cardiology | Admitting: Cardiology

## 2012-03-11 ENCOUNTER — Encounter (HOSPITAL_COMMUNITY): Payer: Medicare Other

## 2012-03-14 ENCOUNTER — Encounter (HOSPITAL_COMMUNITY): Payer: Medicare Other

## 2012-03-16 ENCOUNTER — Telehealth (HOSPITAL_COMMUNITY): Payer: Self-pay | Admitting: Cardiac Rehabilitation

## 2012-03-16 ENCOUNTER — Encounter (HOSPITAL_COMMUNITY): Payer: Medicare Other

## 2012-03-16 NOTE — Telephone Encounter (Signed)
Message received pt absent from cardiac rehab today for sickness, has been out of town past several days and returned home sick

## 2012-03-18 ENCOUNTER — Encounter (HOSPITAL_COMMUNITY): Payer: Medicare Other

## 2012-03-21 ENCOUNTER — Encounter (HOSPITAL_COMMUNITY)
Admission: RE | Admit: 2012-03-21 | Discharge: 2012-03-21 | Disposition: A | Discharge status: Home / Self Care | Payer: Medicare Other | Source: Ambulatory Visit | Attending: Cardiology | Admitting: Cardiology

## 2012-03-23 ENCOUNTER — Encounter (HOSPITAL_COMMUNITY)
Admission: RE | Admit: 2012-03-23 | Discharge: 2012-03-23 | Disposition: A | Discharge status: Home / Self Care | Payer: Medicare Other | Source: Ambulatory Visit | Attending: Cardiology | Admitting: Cardiology

## 2012-03-25 ENCOUNTER — Encounter (HOSPITAL_COMMUNITY): Payer: Medicare Other

## 2012-03-28 ENCOUNTER — Encounter (HOSPITAL_COMMUNITY): Payer: Medicare Other

## 2012-03-30 ENCOUNTER — Encounter (HOSPITAL_COMMUNITY): Payer: Medicare Other

## 2012-04-04 ENCOUNTER — Encounter (HOSPITAL_COMMUNITY): Payer: Medicare Other

## 2012-04-06 ENCOUNTER — Encounter (HOSPITAL_COMMUNITY): Payer: Medicare Other

## 2012-04-08 ENCOUNTER — Encounter (HOSPITAL_COMMUNITY): Payer: Medicare Other

## 2012-04-11 ENCOUNTER — Telehealth (HOSPITAL_COMMUNITY): Payer: Self-pay | Admitting: Cardiac Rehabilitation

## 2012-04-11 ENCOUNTER — Encounter (HOSPITAL_COMMUNITY): Payer: Medicare Other
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
## 2012-04-13 ENCOUNTER — Encounter (HOSPITAL_COMMUNITY): Payer: Medicare Other

## 2012-04-15 ENCOUNTER — Encounter (HOSPITAL_COMMUNITY)
Admission: RE | Admit: 2012-04-15 | Discharge: 2012-04-15 | Disposition: A | Discharge status: Home / Self Care | Payer: Medicare Other | Source: Ambulatory Visit | Attending: Cardiology | Admitting: Cardiology

## 2012-04-15 DIAGNOSIS — Z9861 Coronary angioplasty status: Secondary | ICD-10-CM | POA: Insufficient documentation

## 2012-04-15 DIAGNOSIS — I2510 Atherosclerotic heart disease of native coronary artery without angina pectoris: Secondary | ICD-10-CM | POA: Insufficient documentation

## 2012-04-15 DIAGNOSIS — Z5189 Encounter for other specified aftercare: Secondary | ICD-10-CM | POA: Insufficient documentation

## 2012-04-15 DIAGNOSIS — I252 Old myocardial infarction: Secondary | ICD-10-CM | POA: Insufficient documentation
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
## 2012-04-18 ENCOUNTER — Encounter (HOSPITAL_COMMUNITY)
Admission: RE | Admit: 2012-04-18 | Discharge: 2012-04-18 | Disposition: A | Discharge status: Home / Self Care | Payer: Medicare Other | Source: Ambulatory Visit | Attending: Cardiology | Admitting: Cardiology

## 2012-04-20 ENCOUNTER — Encounter (HOSPITAL_COMMUNITY): Payer: Medicare Other

## 2012-04-22 ENCOUNTER — Encounter (HOSPITAL_COMMUNITY)
Admission: RE | Admit: 2012-04-22 | Discharge: 2012-04-22 | Disposition: A | Discharge status: Home / Self Care | Payer: Medicare Other | Source: Ambulatory Visit | Attending: Cardiology | Admitting: Cardiology

## 2012-04-25 ENCOUNTER — Encounter (HOSPITAL_COMMUNITY): Payer: Medicare Other

## 2012-04-29 ENCOUNTER — Encounter (HOSPITAL_COMMUNITY): Payer: Medicare Other

## 2012-05-02 ENCOUNTER — Encounter (HOSPITAL_COMMUNITY)
Admission: RE | Admit: 2012-05-02 | Discharge: 2012-05-02 | Disposition: A | Discharge status: Home / Self Care | Payer: Medicare Other | Source: Ambulatory Visit | Attending: Cardiology | Admitting: Cardiology

## 2012-05-06 ENCOUNTER — Encounter (HOSPITAL_COMMUNITY): Payer: Medicare Other

## 2012-05-09 ENCOUNTER — Encounter (HOSPITAL_COMMUNITY): Payer: Medicare Other

## 2012-05-11 ENCOUNTER — Encounter (HOSPITAL_COMMUNITY)
Admission: RE | Admit: 2012-05-11 | Discharge: 2012-05-11 | Disposition: A | Discharge status: Home / Self Care | Payer: Medicare Other | Source: Ambulatory Visit | Attending: Cardiology | Admitting: Cardiology

## 2012-05-11 DIAGNOSIS — Z5189 Encounter for other specified aftercare: Secondary | ICD-10-CM | POA: Insufficient documentation

## 2012-05-11 DIAGNOSIS — I252 Old myocardial infarction: Secondary | ICD-10-CM | POA: Insufficient documentation

## 2012-05-11 DIAGNOSIS — I2510 Atherosclerotic heart disease of native coronary artery without angina pectoris: Secondary | ICD-10-CM | POA: Insufficient documentation

## 2012-05-11 DIAGNOSIS — Z9861 Coronary angioplasty status: Secondary | ICD-10-CM | POA: Insufficient documentation

## 2012-05-13 ENCOUNTER — Encounter (HOSPITAL_COMMUNITY): Payer: Medicare Other

## 2012-05-16 ENCOUNTER — Encounter (HOSPITAL_COMMUNITY)
Admission: RE | Admit: 2012-05-16 | Discharge: 2012-05-16 | Disposition: A | Discharge status: Home / Self Care | Payer: Medicare Other | Source: Ambulatory Visit | Attending: Cardiology | Admitting: Cardiology

## 2012-05-18 ENCOUNTER — Encounter (HOSPITAL_COMMUNITY)
Admission: RE | Admit: 2012-05-18 | Discharge: 2012-05-18 | Disposition: A | Discharge status: Home / Self Care | Payer: Medicare Other | Source: Ambulatory Visit | Attending: Cardiology | Admitting: Cardiology

## 2012-05-20 ENCOUNTER — Encounter (HOSPITAL_COMMUNITY): Payer: Medicare Other

## 2012-05-23 ENCOUNTER — Encounter (HOSPITAL_COMMUNITY): Admission: RE | Admit: 2012-05-23 | Payer: Medicare Other | Source: Ambulatory Visit

## 2012-05-25 ENCOUNTER — Encounter (HOSPITAL_COMMUNITY)
Admission: RE | Admit: 2012-05-25 | Discharge: 2012-05-25 | Disposition: A | Discharge status: Home / Self Care | Payer: Medicare Other | Source: Ambulatory Visit | Attending: Cardiology | Admitting: Cardiology

## 2012-05-27 ENCOUNTER — Encounter (HOSPITAL_COMMUNITY): Payer: Medicare Other

## 2012-05-30 ENCOUNTER — Encounter (HOSPITAL_COMMUNITY): Payer: Medicare Other

## 2012-06-01 ENCOUNTER — Encounter (HOSPITAL_COMMUNITY): Payer: Medicare Other

## 2012-06-03 ENCOUNTER — Encounter (HOSPITAL_COMMUNITY): Payer: Medicare Other

## 2012-06-03 ENCOUNTER — Encounter (HOSPITAL_COMMUNITY): Payer: Self-pay

## 2012-06-03 NOTE — Progress Notes (Signed)
Pt last day to participate in cardiac rehab was today. Unfortunately, pt unable to exercise today due to continued bronchitis.  Pt came to rehab department to return her post rehab homework packet. Pt given her graduation information including home exercises.  Pt plans to continue exercise by participating in cardiac maintenance program.  Pt does admit that she continues to smoke 6 cigarettes per day which is a decrease for her but she is not committed to quitting completely.  Pt has had sporadic attendance at cardiac rehab for multiple reasons, including medical problems, emergency home repairs, out of town travel to visit family, emotional related.  However pt does seem to be very interested in continuing with maintenance program.  Pt will be given opportunities to participate in group classes missed due to absence.

## 2012-06-06 ENCOUNTER — Encounter (HOSPITAL_COMMUNITY): Payer: Self-pay

## 2012-06-06 DIAGNOSIS — I252 Old myocardial infarction: Secondary | ICD-10-CM | POA: Insufficient documentation

## 2012-06-06 DIAGNOSIS — Z9861 Coronary angioplasty status: Secondary | ICD-10-CM | POA: Insufficient documentation

## 2012-06-06 DIAGNOSIS — I2510 Atherosclerotic heart disease of native coronary artery without angina pectoris: Secondary | ICD-10-CM | POA: Insufficient documentation

## 2012-06-06 DIAGNOSIS — Z5189 Encounter for other specified aftercare: Secondary | ICD-10-CM | POA: Insufficient documentation

## 2012-06-08 ENCOUNTER — Encounter (HOSPITAL_COMMUNITY)
Admission: RE | Admit: 2012-06-08 | Discharge: 2012-06-08 | Disposition: A | Discharge status: Home / Self Care | Payer: Self-pay | Source: Ambulatory Visit | Attending: Cardiology | Admitting: Cardiology

## 2012-06-10 ENCOUNTER — Encounter (HOSPITAL_COMMUNITY)
Admission: RE | Admit: 2012-06-10 | Discharge: 2012-06-10 | Disposition: A | Discharge status: Home / Self Care | Payer: Self-pay | Source: Ambulatory Visit | Attending: Cardiology | Admitting: Cardiology

## 2012-06-13 ENCOUNTER — Encounter (HOSPITAL_COMMUNITY): Admission: RE | Admit: 2012-06-13 | Payer: Self-pay | Source: Ambulatory Visit

## 2012-06-15 ENCOUNTER — Encounter (HOSPITAL_COMMUNITY): Payer: Self-pay

## 2012-06-17 ENCOUNTER — Encounter (HOSPITAL_COMMUNITY): Payer: Self-pay

## 2012-06-20 ENCOUNTER — Encounter (HOSPITAL_COMMUNITY): Payer: Self-pay

## 2012-06-22 ENCOUNTER — Encounter (HOSPITAL_COMMUNITY): Payer: Self-pay

## 2012-06-24 ENCOUNTER — Encounter (HOSPITAL_COMMUNITY): Payer: Self-pay

## 2012-06-27 ENCOUNTER — Encounter (HOSPITAL_COMMUNITY): Payer: Self-pay

## 2012-06-29 ENCOUNTER — Encounter (HOSPITAL_COMMUNITY)
Admission: RE | Admit: 2012-06-29 | Discharge: 2012-06-29 | Disposition: A | Discharge status: Home / Self Care | Payer: Self-pay | Source: Ambulatory Visit | Attending: Cardiology | Admitting: Cardiology

## 2012-07-01 ENCOUNTER — Encounter (HOSPITAL_COMMUNITY): Payer: Self-pay

## 2012-07-04 ENCOUNTER — Encounter (HOSPITAL_COMMUNITY): Payer: Self-pay

## 2012-07-04 DIAGNOSIS — I252 Old myocardial infarction: Secondary | ICD-10-CM | POA: Insufficient documentation

## 2012-07-04 DIAGNOSIS — Z9861 Coronary angioplasty status: Secondary | ICD-10-CM | POA: Insufficient documentation

## 2012-07-04 DIAGNOSIS — Z5189 Encounter for other specified aftercare: Secondary | ICD-10-CM | POA: Insufficient documentation

## 2012-07-04 DIAGNOSIS — I2510 Atherosclerotic heart disease of native coronary artery without angina pectoris: Secondary | ICD-10-CM | POA: Insufficient documentation

## 2012-07-06 ENCOUNTER — Encounter (HOSPITAL_COMMUNITY): Payer: Self-pay

## 2012-07-08 ENCOUNTER — Encounter (HOSPITAL_COMMUNITY): Payer: Self-pay

## 2012-07-11 ENCOUNTER — Encounter (HOSPITAL_COMMUNITY): Payer: Self-pay

## 2012-07-13 ENCOUNTER — Encounter (HOSPITAL_COMMUNITY): Payer: Self-pay

## 2012-07-15 ENCOUNTER — Encounter (HOSPITAL_COMMUNITY)
Admission: RE | Admit: 2012-07-15 | Discharge: 2012-07-15 | Disposition: A | Discharge status: Home / Self Care | Payer: Self-pay | Source: Ambulatory Visit | Attending: Cardiology | Admitting: Cardiology

## 2012-07-18 ENCOUNTER — Encounter (HOSPITAL_COMMUNITY): Payer: Self-pay

## 2012-07-20 ENCOUNTER — Encounter (HOSPITAL_COMMUNITY): Payer: Self-pay

## 2012-07-22 ENCOUNTER — Encounter (HOSPITAL_COMMUNITY): Payer: Self-pay

## 2012-07-25 ENCOUNTER — Encounter (HOSPITAL_COMMUNITY)
Admission: RE | Admit: 2012-07-25 | Discharge: 2012-07-25 | Disposition: A | Discharge status: Home / Self Care | Payer: Self-pay | Source: Ambulatory Visit | Attending: Cardiology | Admitting: Cardiology

## 2012-07-27 ENCOUNTER — Encounter (HOSPITAL_COMMUNITY)
Admission: RE | Admit: 2012-07-27 | Discharge: 2012-07-27 | Disposition: A | Discharge status: Home / Self Care | Payer: Self-pay | Source: Ambulatory Visit | Attending: Cardiology | Admitting: Cardiology

## 2012-07-29 ENCOUNTER — Encounter (HOSPITAL_COMMUNITY): Payer: Self-pay

## 2012-08-01 ENCOUNTER — Encounter (HOSPITAL_COMMUNITY): Payer: Self-pay

## 2012-08-03 ENCOUNTER — Encounter (HOSPITAL_COMMUNITY): Payer: Medicare Other

## 2012-08-05 ENCOUNTER — Encounter (HOSPITAL_COMMUNITY): Payer: Medicare Other

## 2012-08-08 ENCOUNTER — Encounter (HOSPITAL_COMMUNITY): Payer: Medicare Other

## 2012-08-10 ENCOUNTER — Encounter (HOSPITAL_COMMUNITY): Payer: Medicare Other

## 2012-08-12 ENCOUNTER — Encounter (HOSPITAL_COMMUNITY): Payer: Medicare Other

## 2012-08-15 ENCOUNTER — Encounter (HOSPITAL_COMMUNITY): Payer: Medicare Other

## 2012-08-17 ENCOUNTER — Encounter (HOSPITAL_COMMUNITY): Payer: Medicare Other

## 2012-08-19 ENCOUNTER — Encounter (HOSPITAL_COMMUNITY): Payer: Medicare Other

## 2012-08-22 ENCOUNTER — Encounter (HOSPITAL_COMMUNITY): Payer: Medicare Other

## 2012-08-24 ENCOUNTER — Encounter (HOSPITAL_COMMUNITY): Payer: Medicare Other

## 2012-08-26 ENCOUNTER — Encounter (HOSPITAL_COMMUNITY): Payer: Medicare Other

## 2012-08-29 ENCOUNTER — Encounter (HOSPITAL_COMMUNITY): Payer: Medicare Other

## 2012-08-31 ENCOUNTER — Encounter (HOSPITAL_COMMUNITY): Payer: Medicare Other

## 2012-09-02 ENCOUNTER — Encounter (HOSPITAL_COMMUNITY): Payer: Medicare Other

## 2012-09-05 ENCOUNTER — Encounter (HOSPITAL_COMMUNITY): Payer: Medicare Other

## 2012-09-07 ENCOUNTER — Encounter (HOSPITAL_COMMUNITY): Payer: Medicare Other

## 2012-09-09 ENCOUNTER — Encounter (HOSPITAL_COMMUNITY): Payer: Medicare Other

## 2012-09-12 ENCOUNTER — Encounter (HOSPITAL_COMMUNITY): Payer: Medicare Other

## 2012-09-14 ENCOUNTER — Encounter (HOSPITAL_COMMUNITY): Payer: Medicare Other

## 2012-09-16 ENCOUNTER — Encounter (HOSPITAL_COMMUNITY): Payer: Medicare Other

## 2012-09-19 ENCOUNTER — Encounter (HOSPITAL_COMMUNITY): Payer: Medicare Other

## 2012-09-21 ENCOUNTER — Encounter (HOSPITAL_COMMUNITY): Payer: Medicare Other

## 2012-09-23 ENCOUNTER — Encounter (HOSPITAL_COMMUNITY): Payer: Medicare Other

## 2012-09-28 ENCOUNTER — Encounter (HOSPITAL_COMMUNITY): Payer: Medicare Other

## 2012-09-30 ENCOUNTER — Encounter (HOSPITAL_COMMUNITY): Payer: Medicare Other

## 2012-10-03 ENCOUNTER — Encounter (HOSPITAL_COMMUNITY): Payer: Medicare Other

## 2012-10-05 ENCOUNTER — Encounter (HOSPITAL_COMMUNITY): Payer: Medicare Other

## 2012-10-07 ENCOUNTER — Encounter (HOSPITAL_COMMUNITY): Payer: Medicare Other

## 2012-10-10 ENCOUNTER — Encounter (HOSPITAL_COMMUNITY): Payer: Medicare Other

## 2012-10-12 ENCOUNTER — Encounter (HOSPITAL_COMMUNITY): Payer: Medicare Other

## 2012-10-14 ENCOUNTER — Encounter (HOSPITAL_COMMUNITY): Payer: Medicare Other

## 2012-10-17 ENCOUNTER — Encounter (HOSPITAL_COMMUNITY): Payer: Medicare Other

## 2012-10-19 ENCOUNTER — Encounter (HOSPITAL_COMMUNITY): Payer: Medicare Other

## 2012-10-21 ENCOUNTER — Encounter (HOSPITAL_COMMUNITY): Payer: Medicare Other

## 2012-10-24 ENCOUNTER — Encounter (HOSPITAL_COMMUNITY): Payer: Medicare Other

## 2012-10-26 ENCOUNTER — Encounter (HOSPITAL_COMMUNITY): Payer: Medicare Other

## 2012-10-28 ENCOUNTER — Encounter (HOSPITAL_COMMUNITY): Payer: Medicare Other

## 2012-10-31 ENCOUNTER — Encounter (HOSPITAL_COMMUNITY): Payer: Medicare Other

## 2012-11-02 ENCOUNTER — Encounter (HOSPITAL_COMMUNITY): Payer: Medicare Other

## 2012-11-07 ENCOUNTER — Encounter (HOSPITAL_COMMUNITY): Payer: Medicare Other

## 2012-11-09 ENCOUNTER — Encounter (HOSPITAL_COMMUNITY): Payer: Medicare Other

## 2012-11-11 ENCOUNTER — Encounter (HOSPITAL_COMMUNITY): Payer: Medicare Other

## 2012-11-14 ENCOUNTER — Encounter (HOSPITAL_COMMUNITY): Payer: Medicare Other

## 2012-11-16 ENCOUNTER — Encounter (HOSPITAL_COMMUNITY): Payer: Medicare Other

## 2012-11-18 ENCOUNTER — Encounter (HOSPITAL_COMMUNITY): Payer: Medicare Other

## 2012-11-21 ENCOUNTER — Encounter (HOSPITAL_COMMUNITY): Payer: Medicare Other

## 2012-11-23 ENCOUNTER — Encounter (HOSPITAL_COMMUNITY): Payer: Medicare Other

## 2012-11-25 ENCOUNTER — Encounter (HOSPITAL_COMMUNITY): Payer: Medicare Other

## 2012-11-28 ENCOUNTER — Encounter (HOSPITAL_COMMUNITY): Payer: Medicare Other

## 2012-11-30 ENCOUNTER — Encounter (HOSPITAL_COMMUNITY): Payer: Medicare Other

## 2012-12-02 ENCOUNTER — Encounter (HOSPITAL_COMMUNITY): Payer: Medicare Other

## 2012-12-05 ENCOUNTER — Encounter (HOSPITAL_COMMUNITY): Payer: Medicare Other

## 2012-12-07 ENCOUNTER — Encounter (INDEPENDENT_AMBULATORY_CARE_PROVIDER_SITE_OTHER): Payer: Self-pay | Admitting: General Surgery

## 2012-12-07 ENCOUNTER — Encounter (HOSPITAL_COMMUNITY): Payer: Medicare Other

## 2012-12-07 ENCOUNTER — Ambulatory Visit (INDEPENDENT_AMBULATORY_CARE_PROVIDER_SITE_OTHER): Payer: Medicare Other | Admitting: General Surgery

## 2012-12-07 DIAGNOSIS — M1990 Unspecified osteoarthritis, unspecified site: Secondary | ICD-10-CM

## 2012-12-07 DIAGNOSIS — E663 Overweight: Secondary | ICD-10-CM | POA: Insufficient documentation

## 2012-12-07 DIAGNOSIS — I2510 Atherosclerotic heart disease of native coronary artery without angina pectoris: Secondary | ICD-10-CM

## 2012-12-07 DIAGNOSIS — Z9989 Dependence on other enabling machines and devices: Secondary | ICD-10-CM

## 2012-12-07 DIAGNOSIS — G4733 Obstructive sleep apnea (adult) (pediatric): Secondary | ICD-10-CM

## 2012-12-07 DIAGNOSIS — I10 Essential (primary) hypertension: Secondary | ICD-10-CM

## 2012-12-07 HISTORY — DX: Overweight: E66.3

## 2012-12-07 NOTE — Progress Notes (Signed)
Subjective:   morbid obesity  Patient ID: [name], female   DOB: 1955/08/27, 57 y.o.   MRN: [id number]  HPI [name] y.o.female presents for consideration for surgical treatment for morbid obesity.  she  gives a history of progressive obesity since early adulthood. despite multiple attempts at medical management. She has had very significant weight loss in the past losing down to normal weight about 20 years ago but then has experienced progressive weight gain.  her weight has been affecting her in a number of ways including multiple significant illnesses directly related to her weight including obstructive sleep apnea, degenerative joint disease and chronic pain, coronary artery disease hypertension and dyslipidemia.. She feels it is very difficult to perform routine daily activities. She is also very concerned about her long-term health issues he remains at her current weight.  she has been to our initial information seminar, researched surgical options thoroughly prior chart discussion is interested in possibly sleeve gastrectomy.  Past Medical History  Diagnosis Date  . Hypertension   . Hyperlipemia   . Chronic pain     "back and legs"  . Degenerative joint disease   . Anxiety   . Migraine headache   . Hepatitis     Hepatitis A  . Angina   . Myocardial infarction 06/29/11    "first one"  . Pneumonia   . Depression   . Sleep apnea   . CPAP (continuous positive airway pressure) dependence    Past Surgical History  Procedure Laterality Date  . Abdominal hysterectomy    . Abdominal surgery      removal of benign pelvic tumore  . Tmj arthroplasty    . Replacement total knee    . Joint replacement      bilateral knee replacements  . Knee surgery      "multiple knee scopes and OR bilaterally; final outcome bilateral knee replacements"  . Tonsillectomy and adenoidectomy    . Coronary angioplasty with stent placement     Current Outpatient Prescriptions  Medication Sig  Dispense Refill  . ALPRAZolam (XANAX) 0.5 MG tablet       . atorvastatin (LIPITOR) 80 MG tablet Take 80 mg by mouth daily.      . fentaNYL (DURAGESIC - DOSED MCG/HR) 50 MCG/HR Place 1 patch onto the skin every other day.      . furosemide (LASIX) 80 MG tablet Take 80 mg by mouth daily.      Marland Kitchen ibuprofen (ADVIL,MOTRIN) 200 MG tablet Take 800 mg by mouth 2 (two) times daily as needed. For pain      . isosorbide mononitrate (IMDUR) 30 MG 24 hr tablet Take 1 tablet (30 mg total) by mouth daily.  30 tablet  11  . lisinopril (PRINIVIL,ZESTRIL) 20 MG tablet Take 1 tablet (20 mg total) by mouth daily.  30 tablet  11  . metoprolol tartrate (LOPRESSOR) 12.5 mg TABS Take 0.5 tablets (12.5 mg total) by mouth 2 (two) times daily.  30 tablet  11  . Multiple Vitamin (MULITIVITAMIN WITH MINERALS) TABS Take 1 tablet by mouth daily.      . potassium chloride SA (K-DUR,KLOR-CON) 20 MEQ tablet Take 1 tablet (20 mEq total) by mouth 3 (three) times daily.  90 tablet  11  . prasugrel (EFFIENT) 10 MG TABS Take 10 mg by mouth daily.      Marland Kitchen venlafaxine (EFFEXOR) 37.5 MG tablet Take 37.5 mg by mouth 2 (two) times daily.      . nitroGLYCERIN (  NITROSTAT) 0.4 MG SL tablet Place 1 tablet (0.4 mg total) under the tongue every 5 (five) minutes as needed for chest pain.  25 tablet  5   No current facility-administered medications for this visit.   No Known Allergies History  Substance Use Topics  . Smoking status: Current Every Day Smoker -- 0.03 packs/day for 30 years    Types: Cigarettes  . Smokeless tobacco: Never Used     Comment: smoking consult entered 06/30/11 1503  . Alcohol Use: No     Review of Systems  Constitutional: Positive for fatigue. Negative for fever and unexpected weight change.  HENT: Negative.   Respiratory: Positive for shortness of breath. Negative for wheezing and stridor.   Cardiovascular: Positive for chest pain and leg swelling. Negative for palpitations.  Gastrointestinal: Negative.    Genitourinary: Negative.   Musculoskeletal: Positive for myalgias, back pain, joint swelling, arthralgias and gait problem.  Psychiatric/Behavioral: Positive for dysphoric mood. Negative for suicidal ideas.       Objective:   Physical Exam BP 130/84  Pulse 102  Resp 18  Ht 5\' 7"  (1.702 m)  Wt 264 lb 3.2 oz (119.84 kg)  BMI 41.37 kg/m2 General: Alert, Morbidly obese Caucasian female, in no distress Skin: Warm and dry without rash or infection. HEENT: No palpable masses or thyromegaly. Sclera nonicteric. Pupils equal round and reactive. Oropharynx clear. Lymph nodes: No cervical, supraclavicular, or inguinal nodes palpable. Lungs: Breath sounds clear and equal without increased work of breathing Cardiovascular: Regular rate and rhythm without murmur. No JVD or edema. Peripheral pulses intact. Abdomen: Nondistended. Soft and nontender. No masses palpable. No organomegaly. No palpable hernias. Extremities: No edema or joint swelling or deformity. No chronic venous stasis changes. Neurologic: Alert and fully oriented. Gait normal.    Assessment:     Progressive morbid obesity unresponsive to multiple efforts at medical management. She presents with a BMI of 41 with multiple comorbidities including obstructive sleep apnea, degenerative joint disease, dyslipidemia, coronary artery disease and hypertension. We had a long discussion today regarding various surgical treatment options available and their risks and outcomes. With her multiple medical problems and chronic anticoagulation she feels that she does not want a gastric bypass due to the very extensive nature of the surgery. We spent a long time discussing alternatives of lap band versus gastric sleeve resection. We discussed that lap band certainly the safest alternative although weight loss is the least reliable. I would be somewhat concerned that with her inability to exercise regularly due to her severe musculoskeletal disease that she  may well struggle with weight loss with a lap band. We discussed gastric sleeve in detail. We discussed the nature of the surgery and recovery. We discussed immediate surgical risks of anesthetic complications, cardiorespiratory complications, bleeding, leakage and infection and rare risk of death. We discussed that her risk of bleeding might be somewhat elevated due to her chronic anticoagulation. Discuss long-term complications of stenosis or stricture and reflux and others. At the end of our discussion she feels that she would like to proceed with preoperative workup for gastric sleeve resection. I believe she would have very significant medical benefits including her weight down to a healthier level.    Plan:     Proceed with preoperative workup for arthroscopic sleeve gastrectomy including psych and nutrition evaluations, lab work, upper GI series. We will need to obtain cardiac clearance from Dr. Mayford Knife. We discussed that she needs to be completely off the cigarettes for one month prior to  surgery and she is working on this and has almost quit.

## 2012-12-09 ENCOUNTER — Encounter (HOSPITAL_COMMUNITY): Payer: Medicare Other

## 2012-12-12 ENCOUNTER — Encounter (HOSPITAL_COMMUNITY): Payer: Medicare Other

## 2012-12-14 ENCOUNTER — Encounter (HOSPITAL_COMMUNITY): Payer: Medicare Other

## 2012-12-14 LAB — CBC WITH DIFFERENTIAL/PLATELET
Basophils Absolute: 0.1 10*3/uL (ref 0.0–0.1)
Basophils Relative: 1 % (ref 0–1)
Eosinophils Relative: 6 % — ABNORMAL HIGH (ref 0–5)
HCT: 38.1 % (ref 36.0–46.0)
MCHC: 34.4 g/dL (ref 30.0–36.0)
MCV: 88.4 fL (ref 78.0–100.0)
Monocytes Absolute: 0.6 10*3/uL (ref 0.1–1.0)
RDW: 13.1 % (ref 11.5–15.5)

## 2012-12-14 LAB — LIPID PANEL
Cholesterol: 145 mg/dL (ref 0–200)
Total CHOL/HDL Ratio: 3.5 Ratio
Triglycerides: 129 mg/dL (ref <150)
VLDL: 26 mg/dL (ref 0–40)

## 2012-12-14 LAB — COMPREHENSIVE METABOLIC PANEL
AST: 18 U/L (ref 0–37)
Alkaline Phosphatase: 98 U/L (ref 39–117)
BUN: 17 mg/dL (ref 6–23)
Creat: 0.75 mg/dL (ref 0.50–1.10)

## 2012-12-16 ENCOUNTER — Encounter (HOSPITAL_COMMUNITY): Payer: Medicare Other

## 2012-12-19 ENCOUNTER — Encounter (HOSPITAL_COMMUNITY): Payer: Medicare Other

## 2012-12-21 ENCOUNTER — Encounter (HOSPITAL_COMMUNITY): Payer: Medicare Other

## 2012-12-22 ENCOUNTER — Ambulatory Visit (HOSPITAL_COMMUNITY): Payer: Medicare Other

## 2012-12-23 ENCOUNTER — Encounter (HOSPITAL_COMMUNITY): Payer: Medicare Other

## 2012-12-26 ENCOUNTER — Encounter (HOSPITAL_COMMUNITY): Payer: Medicare Other

## 2012-12-27 ENCOUNTER — Ambulatory Visit: Payer: Medicare Other | Admitting: Dietician

## 2012-12-28 ENCOUNTER — Encounter (HOSPITAL_COMMUNITY): Payer: Medicare Other

## 2012-12-30 ENCOUNTER — Encounter (HOSPITAL_COMMUNITY): Payer: Medicare Other

## 2013-01-04 ENCOUNTER — Encounter (HOSPITAL_COMMUNITY): Payer: Medicare Other

## 2013-01-06 ENCOUNTER — Encounter (HOSPITAL_COMMUNITY): Payer: Medicare Other

## 2013-01-09 ENCOUNTER — Encounter (HOSPITAL_COMMUNITY): Payer: Medicare Other

## 2013-01-11 ENCOUNTER — Encounter (HOSPITAL_COMMUNITY): Payer: Medicare Other

## 2013-01-13 ENCOUNTER — Encounter (HOSPITAL_COMMUNITY): Payer: Medicare Other

## 2013-01-16 ENCOUNTER — Encounter (HOSPITAL_COMMUNITY): Payer: Medicare Other

## 2013-01-18 ENCOUNTER — Encounter (HOSPITAL_COMMUNITY): Payer: Medicare Other

## 2013-01-19 ENCOUNTER — Ambulatory Visit (HOSPITAL_COMMUNITY): Payer: Medicare Other

## 2013-01-19 ENCOUNTER — Ambulatory Visit (HOSPITAL_COMMUNITY): Admission: RE | Admit: 2013-01-19 | Payer: Medicare Other | Source: Ambulatory Visit

## 2013-01-20 ENCOUNTER — Ambulatory Visit: Payer: Medicare Other | Admitting: Dietician

## 2013-01-20 ENCOUNTER — Encounter (HOSPITAL_COMMUNITY): Payer: Medicare Other

## 2013-01-23 ENCOUNTER — Encounter (HOSPITAL_COMMUNITY): Payer: Medicare Other

## 2013-01-25 ENCOUNTER — Encounter (HOSPITAL_COMMUNITY): Payer: Medicare Other

## 2013-01-27 ENCOUNTER — Encounter (HOSPITAL_COMMUNITY): Payer: Medicare Other

## 2013-01-30 ENCOUNTER — Encounter (HOSPITAL_COMMUNITY): Payer: Medicare Other

## 2013-01-31 ENCOUNTER — Encounter (INDEPENDENT_AMBULATORY_CARE_PROVIDER_SITE_OTHER): Payer: Self-pay

## 2013-02-01 ENCOUNTER — Encounter (HOSPITAL_COMMUNITY): Payer: Medicare Other

## 2013-02-02 ENCOUNTER — Telehealth: Payer: Self-pay | Admitting: Cardiology

## 2013-02-02 NOTE — Telephone Encounter (Signed)
New problem    Samples of zetia & effient

## 2013-02-03 ENCOUNTER — Encounter (HOSPITAL_COMMUNITY): Payer: Medicare Other

## 2013-02-06 ENCOUNTER — Encounter (HOSPITAL_COMMUNITY): Payer: Medicare Other

## 2013-02-06 NOTE — Telephone Encounter (Signed)
Pt is aware samples are up front for her to pick up.  

## 2013-02-06 NOTE — Telephone Encounter (Signed)
Samples ready for pt  

## 2013-02-08 ENCOUNTER — Encounter (HOSPITAL_COMMUNITY): Payer: Medicare Other

## 2013-02-10 ENCOUNTER — Encounter (HOSPITAL_COMMUNITY): Payer: Medicare Other

## 2013-02-13 ENCOUNTER — Encounter (HOSPITAL_COMMUNITY): Payer: Medicare Other

## 2013-02-15 ENCOUNTER — Encounter (HOSPITAL_COMMUNITY): Payer: Medicare Other

## 2013-02-17 ENCOUNTER — Encounter (HOSPITAL_COMMUNITY): Payer: Medicare Other

## 2013-02-20 ENCOUNTER — Encounter (HOSPITAL_COMMUNITY): Payer: Medicare Other

## 2013-02-22 ENCOUNTER — Encounter (HOSPITAL_COMMUNITY): Payer: Medicare Other

## 2013-02-24 ENCOUNTER — Encounter (HOSPITAL_COMMUNITY): Payer: Medicare Other

## 2013-02-27 ENCOUNTER — Encounter (HOSPITAL_COMMUNITY): Payer: Medicare Other

## 2013-03-01 ENCOUNTER — Encounter (HOSPITAL_COMMUNITY): Payer: Medicare Other

## 2013-03-03 ENCOUNTER — Encounter (HOSPITAL_COMMUNITY): Payer: Medicare Other

## 2013-03-06 ENCOUNTER — Encounter (HOSPITAL_COMMUNITY): Payer: Medicare Other

## 2013-03-07 ENCOUNTER — Telehealth: Payer: Self-pay | Admitting: *Deleted

## 2013-03-07 NOTE — Telephone Encounter (Signed)
Yes, pt is on Effient 10mg  daily and Zetia 10mg  daily. Ok to give samples.

## 2013-03-07 NOTE — Telephone Encounter (Signed)
Samples left at desk for pick up. Patient aware.

## 2013-03-08 ENCOUNTER — Encounter (HOSPITAL_COMMUNITY): Payer: Medicare Other

## 2013-03-10 ENCOUNTER — Encounter (HOSPITAL_COMMUNITY): Payer: Medicare Other

## 2013-03-13 ENCOUNTER — Encounter (HOSPITAL_COMMUNITY): Payer: Medicare Other

## 2013-03-13 ENCOUNTER — Telehealth: Payer: Self-pay | Admitting: General Surgery

## 2013-03-13 NOTE — Telephone Encounter (Signed)
Lvm for pt to return call.

## 2013-03-13 NOTE — Telephone Encounter (Signed)
Message copied by Nita Sells on Mon Mar 13, 2013  5:19 PM ------      Message from: Armanda Magic R      Created: Sun Mar 12, 2013  4:19 PM       Please find out what type of spell patient had ------

## 2013-03-14 ENCOUNTER — Other Ambulatory Visit: Payer: Self-pay | Admitting: *Deleted

## 2013-03-14 MED ORDER — EZETIMIBE 10 MG PO TABS: 10.0000 mg | ORAL_TABLET | Freq: Every day | ORAL | Status: DC

## 2013-03-14 NOTE — Telephone Encounter (Signed)
Follow up    Pt had an episode 11/5 night pt came by 11/6 needs a call back today about this.   Not to happy about not speaking to some one before now.

## 2013-03-14 NOTE — Telephone Encounter (Signed)
Pt was very upset because she said she came to the office to report the symptoms she had the day after it happened, and no one called her back. Pt states that 5 days ago she has an episode of low heart rate it was lower than 70 beats/minutes she does not remember exactly how low it was at  that time, she had severe dizziness and nauseas. Pt states her heart rate is usually about 84 beats/minute. Pt denies any symptoms now. Pt was made aware that Dr. Norris Cross CMA had called her yesterday. Pt states that Duwayne Heck called her after 5 PM and she could not call back then. Pt would like to have some recommendations from Dr.Turner.  Effient 10 mg one tablet once a day 28 sample pills placed at the front desk as requested per pt. Pt is aware that we are out of Zetia 10 mg. Pt. A  refill sent to pt's pharmacy.

## 2013-03-15 ENCOUNTER — Encounter (HOSPITAL_COMMUNITY): Payer: Medicare Other

## 2013-03-15 NOTE — Telephone Encounter (Signed)
Pt declined coming in today. She is working then she's going away she stated. Her episode caused her to be dizzy, over heated, and nauseous. Her Pulse was slow. Nothing helped with easing the problem. She did take a nitro and that helped her after a hour and now she feels fine and has not had another spell since.

## 2013-03-15 NOTE — Telephone Encounter (Signed)
Pt is aware.  

## 2013-03-15 NOTE — Telephone Encounter (Signed)
Please have her come in at 10:30am today to see me

## 2013-03-15 NOTE — Telephone Encounter (Signed)
?   Whether this was a vagal episode.  No med changes for now.  Please have her call if she any further episodes.

## 2013-03-16 ENCOUNTER — Ambulatory Visit (INDEPENDENT_AMBULATORY_CARE_PROVIDER_SITE_OTHER): Payer: Medicare Other | Admitting: Podiatry

## 2013-03-16 ENCOUNTER — Ambulatory Visit (INDEPENDENT_AMBULATORY_CARE_PROVIDER_SITE_OTHER): Payer: Medicare Other

## 2013-03-16 ENCOUNTER — Encounter: Payer: Self-pay | Admitting: Podiatry

## 2013-03-16 VITALS — BP 114/61 | HR 90 | Resp 12 | Ht 67.0 in | Wt 230.0 lb

## 2013-03-16 DIAGNOSIS — R52 Pain, unspecified: Secondary | ICD-10-CM

## 2013-03-16 DIAGNOSIS — M2040 Other hammer toe(s) (acquired), unspecified foot: Secondary | ICD-10-CM

## 2013-03-16 DIAGNOSIS — M19079 Primary osteoarthritis, unspecified ankle and foot: Secondary | ICD-10-CM

## 2013-03-16 DIAGNOSIS — M722 Plantar fascial fibromatosis: Secondary | ICD-10-CM

## 2013-03-16 NOTE — Progress Notes (Signed)
N-SORE L-B/L TOES D-6 MONTHS O-SLOWLY C-WORSE A-SHOES T-N/A

## 2013-03-16 NOTE — Progress Notes (Signed)
  Subjective:    Patient ID: [name], female    DOB: 1955-09-10, 57 y.o.   MRN: [id number]  HPI    Review of Systems  Musculoskeletal: Positive for arthralgias, back pain, gait problem and joint swelling.  Hematological: Bruises/bleeds easily.  All other systems reviewed and are negative.       Objective:   Physical Exam        Assessment & Plan:

## 2013-03-16 NOTE — Progress Notes (Signed)
N-SORE L-B/L FOOT D-1 YEAR O- SLOWLY C-WORSE A-PRESSURE T-N/A

## 2013-03-17 ENCOUNTER — Encounter (HOSPITAL_COMMUNITY): Payer: Medicare Other

## 2013-03-17 NOTE — Progress Notes (Signed)
Subjective:     Patient ID: [name], female   DOB: Dec 24, 1955, 57 y.o.   MRN: [id number]  Foot Pain  Toe Pain    patient states that I have a lot of pain in general and specifically I been getting pain in my toes and then my arches are also giving sore on both feet. Patient states that she had severe septic disease in her right knee which has caused change in gait   Review of Systems  All other systems reviewed and are negative.       Objective:   Physical Exam  Nursing note and vitals reviewed. Constitutional: She is oriented to person, place, and time.  Cardiovascular: Intact distal pulses.   Musculoskeletal: Normal range of motion.  Neurological: She is oriented to person, place, and time.  Skin: Skin is warm.   patient is found to have osteoarthritis with damage to the second and third toes both feet and is found to have pain in the arch both feet. 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 At this time I have recommended night splint to try to stretch the fascia on both feet in the evenings and that long-term this will require proximal and distal arthroplasties of the second and third toes with keratotic interspace lesions noted and enlargement of the joint surface is noted along with narrowing of the surfaces. Patient will reappoint prior to surgery

## 2013-03-20 ENCOUNTER — Encounter (HOSPITAL_COMMUNITY): Payer: Medicare Other

## 2013-03-22 ENCOUNTER — Encounter (HOSPITAL_COMMUNITY): Payer: Medicare Other

## 2013-03-24 ENCOUNTER — Encounter (HOSPITAL_COMMUNITY): Payer: Medicare Other

## 2013-03-27 ENCOUNTER — Encounter (HOSPITAL_COMMUNITY): Payer: Medicare Other

## 2013-03-28 ENCOUNTER — Telehealth: Payer: Self-pay

## 2013-03-28 NOTE — Telephone Encounter (Signed)
patient called wanting samples of zetia and effient, place up front

## 2013-03-29 ENCOUNTER — Encounter (HOSPITAL_COMMUNITY): Payer: Medicare Other

## 2013-04-03 ENCOUNTER — Encounter (HOSPITAL_COMMUNITY): Payer: Medicare Other

## 2013-04-05 ENCOUNTER — Encounter (HOSPITAL_COMMUNITY): Payer: Medicare Other

## 2013-04-07 ENCOUNTER — Encounter: Payer: Self-pay | Admitting: General Surgery

## 2013-04-07 ENCOUNTER — Encounter (HOSPITAL_COMMUNITY): Payer: Medicare Other

## 2013-04-07 DIAGNOSIS — R000 Tachycardia, unspecified: Secondary | ICD-10-CM

## 2013-04-07 DIAGNOSIS — I255 Ischemic cardiomyopathy: Secondary | ICD-10-CM | POA: Insufficient documentation

## 2013-04-10 ENCOUNTER — Encounter: Payer: Self-pay | Admitting: Cardiology

## 2013-04-10 ENCOUNTER — Encounter (HOSPITAL_COMMUNITY): Payer: Medicare Other

## 2013-04-10 ENCOUNTER — Ambulatory Visit (INDEPENDENT_AMBULATORY_CARE_PROVIDER_SITE_OTHER): Payer: Medicare Other | Admitting: Cardiology

## 2013-04-10 VITALS — BP 108/68 | HR 82 | Ht 67.0 in | Wt 228.0 lb

## 2013-04-10 DIAGNOSIS — E785 Hyperlipidemia, unspecified: Secondary | ICD-10-CM

## 2013-04-10 DIAGNOSIS — I2589 Other forms of chronic ischemic heart disease: Secondary | ICD-10-CM

## 2013-04-10 DIAGNOSIS — G4733 Obstructive sleep apnea (adult) (pediatric): Secondary | ICD-10-CM

## 2013-04-10 DIAGNOSIS — I255 Ischemic cardiomyopathy: Secondary | ICD-10-CM

## 2013-04-10 DIAGNOSIS — I2510 Atherosclerotic heart disease of native coronary artery without angina pectoris: Secondary | ICD-10-CM

## 2013-04-10 DIAGNOSIS — I10 Essential (primary) hypertension: Secondary | ICD-10-CM

## 2013-04-10 LAB — BASIC METABOLIC PANEL
BUN: 14 mg/dL (ref 6–23)
Chloride: 105 mEq/L (ref 96–112)
GFR: 75.85 mL/min (ref >60.00)
Glucose, Bld: 114 mg/dL — ABNORMAL HIGH (ref 70–99)
Potassium: 3.8 mEq/L (ref 3.5–5.1)
Sodium: 141 mEq/L (ref 135–145)

## 2013-04-10 LAB — ALT: ALT: 23 U/L (ref 0–35)

## 2013-04-10 LAB — LIPID PANEL
HDL: 33.8 mg/dL — ABNORMAL LOW (ref >39.00)
LDL Cholesterol: 62 mg/dL (ref 0–99)
VLDL: 15.6 mg/dL (ref 0.0–40.0)

## 2013-04-10 NOTE — Patient Instructions (Signed)
Your physician recommends that you continue on your current medications as directed. Please refer to the Current Medication list given to you today.  Your physician recommends that you go to the lab today for a BMET, Lipid, and ALT Panel  We contacted [facility] and they should be contacting you within a week to get a download for CPAP  Your physician wants you to follow-up in: 6 Months with Dr Sherlyn Lick will receive a reminder letter in the mail two months in advance. If you don't receive a letter, please call our office to schedule the follow-up appointment.

## 2013-04-10 NOTE — Progress Notes (Signed)
[address] [place], [place]  [address] Phone: [phone] Fax:  [phone]  Date:  04/10/2013   ID:  [name], [name] [birth year], MRN [id number]  PCP:  Hollice Espy, MD  Cardiologist:  Armanda Magic, MD    History of Present Illness: [name] is a 57 y.o. female with a history of ASCAD, HTN, dyslipidemia, tobacco abuse and ischemic DCM who presents today for followup.  She is doing well.  She denies any chest pain, SOB, DOE, LE edema, dizziness, palpitations or syncope.  She is doing well with her BiPAP therapy and uses it about 5 hours nightly.  She wakes up a lot at night with back pain.  She uses a full face mask which she tolerates well.  She feels the pressure is adequate.  She does not feel refreshed when she gets up due to a lot of chronic pain.  She has lost around 40 pounds with Nutrisystem diet.  She had an episode a few weeks ago in which she became weak, dizzy and nauseated and was concerned it was her heart.  She took 2 SL NTG and about an hour later felt better.    Wt Readings from Last 3 Encounters:  04/10/13 228 lb (103.42 kg)  04/07/13 258 lb 12.8 oz (117.391 kg)  03/16/13 230 lb (104.327 kg)     Past Medical History  Diagnosis Date  . Hypertension   . Hyperlipemia   . Chronic pain     "back and legs"  . Degenerative joint disease   . Anxiety   . Migraine headache   . Hepatitis     Hepatitis A  . Angina   . Myocardial infarction 06/29/11    "first one"  . Pneumonia   . Depression   . Sleep apnea   . CPAP (continuous positive airway pressure) dependence     Current Outpatient Prescriptions  Medication Sig Dispense Refill  . ALPRAZolam (XANAX) 0.5 MG tablet       . aspirin 81 MG tablet Take 81 mg by mouth daily.      Marland Kitchen atorvastatin (LIPITOR) 80 MG tablet Take 80 mg by mouth daily.      Marland Kitchen BRINTELLIX 10 MG TABS       . ezetimibe (ZETIA) 10 MG tablet Take 1 tablet (10 mg total) by mouth daily.  30 tablet  8  . fentaNYL (DURAGESIC -  DOSED MCG/HR) 50 MCG/HR Place 1 patch onto the skin every other day.      . furosemide (LASIX) 80 MG tablet Take 80 mg by mouth daily.      Marland Kitchen ibuprofen (ADVIL,MOTRIN) 200 MG tablet Take 800 mg by mouth 2 (two) times daily as needed. For pain      . isosorbide mononitrate (IMDUR) 60 MG 24 hr tablet Take 60 mg by mouth daily.      Marland Kitchen lisinopril (PRINIVIL,ZESTRIL) 20 MG tablet Take 20 mg by mouth daily.      Marland Kitchen LORazepam (ATIVAN) 0.5 MG tablet Take 0.5 mg by mouth as needed for anxiety.      . metoprolol tartrate (LOPRESSOR) 12.5 mg TABS Take 0.5 tablets (12.5 mg total) by mouth 2 (two) times daily.  30 tablet  11  . Multiple Vitamin (MULITIVITAMIN WITH MINERALS) TABS Take 1 tablet by mouth daily.      . nitroGLYCERIN (NITROSTAT) 0.4 MG SL tablet Place 0.4 mg under the tongue every 5 (five) minutes as needed for chest pain.      Marland Kitchen  potassium chloride SA (K-DUR,KLOR-CON) 20 MEQ tablet Take 1 tablet (20 mEq total) by mouth 3 (three) times daily.  90 tablet  11  . prasugrel (EFFIENT) 10 MG TABS Take 10 mg by mouth daily.      Marland Kitchen venlafaxine (EFFEXOR) 37.5 MG tablet Take 37.5 mg by mouth 2 (two) times daily.       No current facility-administered medications for this visit.    Allergies:   No Known Allergies  Social History:  The patient  reports that she has been smoking Cigarettes.  She has a .9 pack-year smoking history. She has never used smokeless tobacco. She reports that she drinks alcohol. She reports that she does not use illicit drugs.   Family History:  The patient's family history includes Cancer in her father; Heart failure in her mother.   ROS:  Please see the history of present illness.      All other systems reviewed and negative.   PHYSICAL EXAM: VS:  BP 108/68  Pulse 82  Ht 5\' 7"  (1.702 m)  Wt 228 lb (103.42 kg)  BMI 35.70 kg/m2 Well nourished, well developed, in no acute distress HEENT: normal Neck: no JVD Cardiac:  normal S1, S2; RRR; no murmur Lungs:  clear to auscultation  bilaterally, no wheezing, rhonchi or rales Abd: soft, nontender, no hepatomegaly Ext: no edema Skin: warm and dry Neuro:  CNs 2-12 intact, no focal abnormalities noted       ASSESSMENT AND PLAN:  1. ASCAD - she had an episode of nausea, lightheadedness and weakness a few weeks ago and has not had any further episodes.  She recently had a nuclear stress test showing breast attenuation no ischemia.  She will let me know if she has any further weakness episodes.  - continue ASA/Imdur/Effient/metoprolol 2. HTN - well controlled  - continue metoprolol/Lisinopril   - check BMET 3. Dyslipidemia  - continue atorvastatin/Zetia  - check ALT and lipids today 4. ischemic DCM  - continue ACE I and beta blocker 5. Tobacco abuse - not successful with Chantix or Wellbutrin 6. Severe OSA on BiPAP  - I will get a download from her DME  Followup with me in 6 months  Signed, Armanda Magic, MD 04/10/2013 11:27 AM

## 2013-04-12 ENCOUNTER — Other Ambulatory Visit: Payer: Self-pay | Admitting: General Surgery

## 2013-04-12 ENCOUNTER — Encounter: Payer: Self-pay | Admitting: General Surgery

## 2013-04-12 ENCOUNTER — Encounter (HOSPITAL_COMMUNITY): Payer: Medicare Other

## 2013-04-12 DIAGNOSIS — E785 Hyperlipidemia, unspecified: Secondary | ICD-10-CM

## 2013-04-14 ENCOUNTER — Encounter (HOSPITAL_COMMUNITY): Payer: Medicare Other

## 2013-04-17 ENCOUNTER — Encounter (HOSPITAL_COMMUNITY): Payer: Medicare Other

## 2013-04-19 ENCOUNTER — Encounter (HOSPITAL_COMMUNITY): Payer: Medicare Other

## 2013-04-21 ENCOUNTER — Encounter (HOSPITAL_COMMUNITY): Payer: Medicare Other

## 2013-04-24 ENCOUNTER — Encounter (HOSPITAL_COMMUNITY): Payer: Medicare Other

## 2013-04-26 ENCOUNTER — Encounter (HOSPITAL_COMMUNITY): Payer: Medicare Other

## 2013-05-01 ENCOUNTER — Encounter (HOSPITAL_COMMUNITY): Payer: Medicare Other

## 2013-05-03 ENCOUNTER — Encounter (HOSPITAL_COMMUNITY): Payer: Medicare Other

## 2013-05-03 ENCOUNTER — Telehealth: Payer: Self-pay

## 2013-05-03 NOTE — Telephone Encounter (Signed)
Patient needs samples of effeint and zetia place up front

## 2013-05-05 ENCOUNTER — Encounter (HOSPITAL_COMMUNITY): Payer: Medicare Other

## 2013-05-08 ENCOUNTER — Encounter (HOSPITAL_COMMUNITY): Payer: Medicare Other

## 2013-05-10 ENCOUNTER — Encounter (HOSPITAL_COMMUNITY): Payer: Medicare Other

## 2013-05-12 ENCOUNTER — Encounter (HOSPITAL_COMMUNITY): Payer: Medicare Other

## 2013-05-15 ENCOUNTER — Encounter (HOSPITAL_COMMUNITY): Payer: Medicare Other

## 2013-05-17 ENCOUNTER — Encounter (HOSPITAL_COMMUNITY): Payer: Medicare Other

## 2013-05-19 ENCOUNTER — Encounter (HOSPITAL_COMMUNITY): Payer: Medicare Other

## 2013-05-22 ENCOUNTER — Encounter (HOSPITAL_COMMUNITY): Payer: Medicare Other

## 2013-05-24 ENCOUNTER — Encounter (HOSPITAL_COMMUNITY): Payer: Medicare Other

## 2013-05-26 ENCOUNTER — Encounter (HOSPITAL_COMMUNITY): Payer: Medicare Other

## 2013-05-29 ENCOUNTER — Encounter (HOSPITAL_COMMUNITY): Payer: Medicare Other

## 2013-05-31 ENCOUNTER — Encounter (HOSPITAL_COMMUNITY): Payer: Medicare Other

## 2013-06-02 ENCOUNTER — Encounter (HOSPITAL_COMMUNITY): Payer: Medicare Other

## 2013-06-07 ENCOUNTER — Telehealth: Payer: Self-pay | Admitting: *Deleted

## 2013-06-07 NOTE — Telephone Encounter (Signed)
Patient requests effient and zetia samples. Placed at front desk.

## 2013-06-13 ENCOUNTER — Telehealth: Payer: Self-pay | Admitting: Cardiology

## 2013-06-13 NOTE — Telephone Encounter (Signed)
Pt Dropped Off Attending Physicians Statement/[employer] Sent to [facility] 2.10.15

## 2013-06-14 ENCOUNTER — Other Ambulatory Visit: Payer: Self-pay

## 2013-06-14 MED ORDER — ISOSORBIDE MONONITRATE ER 60 MG PO TB24: 60.0000 mg | ORAL_TABLET | Freq: Every day | ORAL | Status: DC

## 2013-07-04 ENCOUNTER — Other Ambulatory Visit: Payer: Self-pay | Admitting: *Deleted

## 2013-07-04 ENCOUNTER — Telehealth: Payer: Self-pay | Admitting: *Deleted

## 2013-07-04 MED ORDER — EZETIMIBE 10 MG PO TABS: 10.0000 mg | ORAL_TABLET | Freq: Every day | ORAL | Status: DC

## 2013-07-04 NOTE — Telephone Encounter (Signed)
Patient requests effient samples. She is aware that they will be left at the front desk for pick up.

## 2013-07-12 ENCOUNTER — Telehealth: Payer: Self-pay | Admitting: *Deleted

## 2013-07-12 ENCOUNTER — Telehealth: Payer: Self-pay

## 2013-07-12 NOTE — Telephone Encounter (Signed)
[employer] , called patient to let her know about samples left at front desk for effient and zetia

## 2013-07-12 NOTE — Telephone Encounter (Signed)
Patient is getting samples monthly for zetia and effient will submit a tier exception to [facility] for both medications.

## 2013-07-18 ENCOUNTER — Other Ambulatory Visit: Payer: Self-pay | Admitting: *Deleted

## 2013-07-18 MED ORDER — ATORVASTATIN CALCIUM 80 MG PO TABS: 80.0000 mg | ORAL_TABLET | Freq: Every day | ORAL | Status: DC

## 2013-07-25 ENCOUNTER — Telehealth: Payer: Self-pay | Admitting: *Deleted

## 2013-07-25 NOTE — Telephone Encounter (Signed)
Patient requests zetia and effient samples, will put at the front desk for pick up.

## 2013-08-15 ENCOUNTER — Other Ambulatory Visit: Payer: Self-pay | Admitting: *Deleted

## 2013-08-15 MED ORDER — LISINOPRIL 20 MG PO TABS: 20.0000 mg | ORAL_TABLET | Freq: Every day | ORAL | Status: DC

## 2013-08-22 ENCOUNTER — Other Ambulatory Visit: Payer: Self-pay

## 2013-08-22 MED ORDER — FUROSEMIDE 80 MG PO TABS: 80.0000 mg | ORAL_TABLET | Freq: Every day | ORAL | Status: DC

## 2013-08-22 MED ORDER — POTASSIUM CHLORIDE CRYS ER 20 MEQ PO TBCR: 20.0000 meq | EXTENDED_RELEASE_TABLET | Freq: Three times a day (TID) | ORAL | Status: DC

## 2013-09-11 ENCOUNTER — Telehealth: Payer: Self-pay | Admitting: Cardiology

## 2013-09-11 ENCOUNTER — Telehealth: Payer: Self-pay

## 2013-09-11 NOTE — Telephone Encounter (Signed)
Pt aware. To Dr Collene Mares

## 2013-09-11 NOTE — Telephone Encounter (Signed)
Taking care of the number 2. Sending to rose to take care of samples for pt.

## 2013-09-11 NOTE — Telephone Encounter (Signed)
Placed samples of effient and zetia up at front desk for patient to pick up

## 2013-09-11 NOTE — Telephone Encounter (Signed)
Please let Dr. Nelva Bush know that I received his OV note and reviewed it.  Patient's PCI was in 2013 so she is ok to hold Effient for a few days for cervical epidural steroid injection.

## 2013-09-11 NOTE — Telephone Encounter (Signed)
Sent to Dr Nelva Bush. Is it ok for pt to stay off because that Monday after is her colonoscopy:? Ok to then start back next day? Dr Collene Mares is doing the colonoscopy

## 2013-09-11 NOTE — Telephone Encounter (Signed)
Yes that is fine

## 2013-09-11 NOTE — Telephone Encounter (Signed)
New message    1.  Want samples of xetia and effient 10mg .  Have not seen Dr Radford Pax at new office 2.  Need to have a colonscopy 6-29 and injections on 6-28.  Need to be off effient for both procedure.  How many days can she be off effient for both procedures.  What day can she stop rx and what day can she restart rx.

## 2013-10-10 ENCOUNTER — Ambulatory Visit: Payer: Medicare Other | Admitting: Physician Assistant

## 2013-10-18 ENCOUNTER — Telehealth: Payer: Self-pay | Admitting: *Deleted

## 2013-10-18 ENCOUNTER — Other Ambulatory Visit: Payer: Self-pay | Admitting: Cardiology

## 2013-10-18 NOTE — Telephone Encounter (Signed)
Patient called for effient and zetia samples. I will place at the front desk for pick up.

## 2013-10-19 ENCOUNTER — Other Ambulatory Visit: Payer: Self-pay

## 2013-10-19 MED ORDER — METOPROLOL TARTRATE 12.5 MG HALF TABLET: 12.5000 mg | ORAL_TABLET | Freq: Two times a day (BID) | ORAL | Status: DC

## 2013-10-20 ENCOUNTER — Other Ambulatory Visit: Payer: Self-pay

## 2013-10-20 MED ORDER — LISINOPRIL 20 MG PO TABS: 20.0000 mg | ORAL_TABLET | Freq: Every day | ORAL | Status: DC

## 2013-10-24 ENCOUNTER — Other Ambulatory Visit: Payer: Self-pay

## 2013-10-24 MED ORDER — METOPROLOL TARTRATE 12.5 MG HALF TABLET: 12.5000 mg | ORAL_TABLET | Freq: Two times a day (BID) | ORAL | Status: DC

## 2013-10-25 ENCOUNTER — Other Ambulatory Visit: Payer: Self-pay | Admitting: *Deleted

## 2013-10-25 ENCOUNTER — Telehealth: Payer: Self-pay | Admitting: *Deleted

## 2013-10-25 MED ORDER — METOPROLOL TARTRATE 12.5 MG HALF TABLET: 12.5000 mg | ORAL_TABLET | Freq: Two times a day (BID) | ORAL | Status: DC

## 2013-10-25 NOTE — Telephone Encounter (Signed)
Per pharmacy this patient is taking metoprolol 25mg  bid, but the chart has 12.5mg  bid. Which is correct? Please advise. Thanks, MI

## 2013-10-27 ENCOUNTER — Other Ambulatory Visit: Payer: Self-pay | Admitting: *Deleted

## 2013-10-27 ENCOUNTER — Other Ambulatory Visit: Payer: Self-pay | Admitting: Gastroenterology

## 2013-10-27 MED ORDER — METOPROLOL TARTRATE 25 MG PO TABS: 25.0000 mg | ORAL_TABLET | Freq: Two times a day (BID) | ORAL | Status: DC

## 2013-10-27 NOTE — Telephone Encounter (Signed)
Pt is supposed to be taking 25 1 tab BID

## 2013-11-08 ENCOUNTER — Encounter (HOSPITAL_COMMUNITY): Payer: Self-pay | Admitting: Pharmacy Technician

## 2013-11-17 ENCOUNTER — Telehealth: Payer: Self-pay | Admitting: *Deleted

## 2013-11-17 NOTE — Telephone Encounter (Signed)
Effient and zetia samples placed at the front desk for pick up.

## 2013-11-21 ENCOUNTER — Ambulatory Visit (INDEPENDENT_AMBULATORY_CARE_PROVIDER_SITE_OTHER): Payer: Medicare Other | Admitting: Cardiology

## 2013-11-21 VITALS — BP 111/61 | HR 94 | Ht 67.0 in | Wt 176.0 lb

## 2013-11-21 DIAGNOSIS — G4730 Sleep apnea, unspecified: Secondary | ICD-10-CM

## 2013-11-21 DIAGNOSIS — I2589 Other forms of chronic ischemic heart disease: Secondary | ICD-10-CM

## 2013-11-21 DIAGNOSIS — R42 Dizziness and giddiness: Secondary | ICD-10-CM

## 2013-11-21 DIAGNOSIS — I2510 Atherosclerotic heart disease of native coronary artery without angina pectoris: Secondary | ICD-10-CM

## 2013-11-21 DIAGNOSIS — I10 Essential (primary) hypertension: Secondary | ICD-10-CM

## 2013-11-21 DIAGNOSIS — I255 Ischemic cardiomyopathy: Secondary | ICD-10-CM

## 2013-11-21 MED ORDER — NITROGLYCERIN 0.4 MG SL SUBL: 0.4000 mg | SUBLINGUAL_TABLET | SUBLINGUAL | Status: DC | PRN

## 2013-11-21 MED ORDER — POTASSIUM CHLORIDE CRYS ER 20 MEQ PO TBCR: 20.0000 meq | EXTENDED_RELEASE_TABLET | Freq: Two times a day (BID) | ORAL | Status: DC

## 2013-11-21 MED ORDER — FUROSEMIDE 80 MG PO TABS: 40.0000 mg | ORAL_TABLET | Freq: Every day | ORAL | Status: DC

## 2013-11-21 NOTE — Patient Instructions (Signed)
Your physician has recommended you make the following change in your medication: 1. Decrease Lasix to 40 Mg daily 2. Decrease Potassium to 20 MEQ Twice a day  Your physician has recommended that you have a sleep study. This test records several body functions during sleep, including: brain activity, eye movement, oxygen and carbon dioxide blood levels, heart rate and rhythm, breathing rate and rhythm, the flow of air through your mouth and nose, snoring, body muscle movements, and chest and belly movement. (Schedule at [facility])  Your physician recommends that you return for lab work in: One week for a BMET  Your physician recommends that you schedule a follow-up appointment in: 4 weeks with [place] physician recommends that you schedule a follow-up appointment in: 3 months with Dr Radford Pax

## 2013-11-21 NOTE — Progress Notes (Signed)
Jacqueline, [name], [place]  [id number] Phone: [phone] Fax:  [phone]  Date:  11/21/2013   ID:  Jacqueline, Mckee 1956/01/15, MRN [id number]  PCP:  Jacqueline Smolder, MD  Cardiologist:  Fransico Him, MD     History of Present Illness: [name] is a 58 y.o. female with a history of ASCAD, HTN, dyslipidemia, tobacco abuse and ischemic DCM who presents today for followup. When she last saw me she had an episode of nausea and dizziness.  A nuclear stress test showed no ischemia.  She has not had any further nausea but she is still having a lot of problems with dizziness that can come out of the blue.  It can occur when she goes from sitting to standing but other times she can be standing and just turn to look somewhere.  The room does not spin.  She just gets lightheaded.  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  It occurs daily.  She denies any chest pain or SOB.  She denies any LE edema or skipping of her heart beat but her heart races some.  She has lost a total of 100 lbs in the past year.    Wt Readings from Last 3 Encounters:  11/21/13 176 lb (79.833 kg)  04/10/13 228 lb (103.42 kg)  04/07/13 258 lb 12.8 oz (117.391 kg)     Past Medical History  Diagnosis Date  . Hypertension   . Hyperlipemia   . Chronic pain     "back and legs"  . Degenerative joint disease   . Anxiety   . Migraine headache   . Hepatitis     Hepatitis A  . Angina   . Myocardial infarction 06/29/11    "first one"  . Pneumonia   . Depression   . Sleep apnea   . CPAP (continuous positive airway pressure) dependence     Current Outpatient Prescriptions  Medication Sig Dispense Refill  . ALPRAZolam (XANAX) 0.5 MG tablet Take 0.5 mg by mouth 3 (three) times daily as needed for anxiety.       Marland Kitchen atorvastatin (LIPITOR) 80 MG tablet Take 80 mg by mouth daily.      Marland Kitchen ezetimibe (ZETIA) 10 MG tablet Take 10 mg by mouth every morning.      . fentaNYL (DURAGESIC - DOSED MCG/HR) 50  MCG/HR Place 1 patch onto the skin every other day.      . furosemide (LASIX) 80 MG tablet Take 80 mg by mouth every morning.      Marland Kitchen ibuprofen (ADVIL,MOTRIN) 800 MG tablet Take 800 mg by mouth every 8 (eight) hours as needed for mild pain.      . isosorbide mononitrate (IMDUR) 60 MG 24 hr tablet Take 60 mg by mouth every morning.      Marland Kitchen lisinopril (PRINIVIL,ZESTRIL) 20 MG tablet Take 20 mg by mouth every morning.      . metoprolol tartrate (LOPRESSOR) 25 MG tablet Take 25 mg by mouth 2 (two) times daily.      . nitroGLYCERIN (NITROSTAT) 0.4 MG SL tablet Place 0.4 mg under the tongue every 5 (five) minutes as needed for chest pain.      . potassium chloride SA (K-DUR,KLOR-CON) 20 MEQ tablet Take 20 mEq by mouth 3 (three) times daily.      . prasugrel (EFFIENT) 10 MG TABS Take 10 mg by mouth every morning.       . temazepam (RESTORIL) 30 MG  capsule Take 30 mg by mouth at bedtime as needed for sleep.      . Vortioxetine HBr (BRINTELLIX) 20 MG TABS Take 20 mg by mouth every morning.       No current facility-administered medications for this visit.    Allergies:   No Known Allergies  Social History:  The patient  reports that she has been smoking Cigarettes.  She has a .9 pack-year smoking history. She has never used smokeless tobacco. She reports that she drinks alcohol. She reports that she does not use illicit drugs.   Family History:  The patient's family history includes Cancer in her father; Heart failure in her mother.   ROS:  Please see the history of present illness.      All other systems reviewed and negative.   PHYSICAL EXAM: VS:  BP 111/61  Pulse 94  Ht 5\' 7"  (1.702 m)  Wt 176 lb (79.833 kg)  BMI 27.56 kg/m2 Well nourished, well developed, in no acute distress HEENT: normal Neck: no JVD Cardiac:  normal S1, S2; RRR; no murmur Lungs:  clear to auscultation bilaterally, no wheezing, rhonchi or rales Abd: soft, nontender, no hepatomegaly Ext: no edema Skin: warm and  dry Neuro:  CNs 2-12 intact, no focal abnormalities noted  EKG:     NSR with nonspecific ST abnormality and RAE  ASSESSMENT AND PLAN:  1. ASCAD - she had an episode of nausea, lightheadedness and weakness a few weeks ago and has not had any further episodes. She recently had a nuclear stress test showing breast attenuation no ischemia.  - continue ASA/Imdur/Effient/metoprolol  2. HTN - well controlled but complaining of dizziness and BP is borderline low.  I suspect she does not need as much Lasix given the amount of weight she has lost.   - continue metoprolol/Lisinopril  - I have recommended that she decrease her dose of Lasix to 40mg  daily since she is having dizziness and borderline low BP. I have also recommended that she decrease her potassium to 34meq 1 tablet BID.  I will check a BMET in 1 week. I have recommended that she get a BP cuff and check her BP when she gets dizzy.   3. Dyslipidemia - continue atorvastatin/Zetia  4. Ischemic DCM - continue ACE I and beta blocker  5. Tobacco abuse - not successful with Chantix or Wellbutrin 6. Severe OSA - she stopped her CPAP on her own due to weight loss.  I will repeat a PSG to see if she still has some OSA.  Followup with PA in 4 weeks and with me in 3 months  Signed, Fransico Him, MD 11/21/2013 1:44 PM

## 2013-11-22 ENCOUNTER — Encounter (HOSPITAL_COMMUNITY): Payer: Self-pay | Admitting: *Deleted

## 2013-11-26 ENCOUNTER — Other Ambulatory Visit: Payer: Self-pay

## 2013-11-26 MED ORDER — FUROSEMIDE 80 MG PO TABS
40.0000 mg | ORAL_TABLET | Freq: Every day | ORAL | Status: DC
Start: 2013-11-26 — End: 2014-05-15

## 2013-11-28 ENCOUNTER — Other Ambulatory Visit (INDEPENDENT_AMBULATORY_CARE_PROVIDER_SITE_OTHER): Payer: Medicare Other

## 2013-11-28 DIAGNOSIS — E785 Hyperlipidemia, unspecified: Secondary | ICD-10-CM

## 2013-11-28 DIAGNOSIS — I10 Essential (primary) hypertension: Secondary | ICD-10-CM

## 2013-11-28 LAB — LIPID PANEL
Cholesterol: 95 mg/dL (ref 0–200)
HDL: 38.7 mg/dL — AB (ref >39.00)
LDL Cholesterol: 43 mg/dL (ref 0–99)
NonHDL: 56.3
Total CHOL/HDL Ratio: 2
Triglycerides: 69 mg/dL (ref 0.0–149.0)
VLDL: 13.8 mg/dL (ref 0.0–40.0)

## 2013-11-28 LAB — BASIC METABOLIC PANEL
BUN: 15 mg/dL (ref 6–23)
CO2: 31 mEq/L (ref 19–32)
Calcium: 8.9 mg/dL (ref 8.4–10.5)
Chloride: 107 mEq/L (ref 96–112)
Creatinine, Ser: 0.7 mg/dL (ref 0.4–1.2)
GFR: 89.38 mL/min (ref >60.00)
Glucose, Bld: 105 mg/dL — ABNORMAL HIGH (ref 70–99)
POTASSIUM: 4 meq/L (ref 3.5–5.1)
Sodium: 142 mEq/L (ref 135–145)

## 2013-11-28 LAB — ALT: ALT: 36 U/L — ABNORMAL HIGH (ref 0–35)

## 2013-11-29 ENCOUNTER — Encounter: Payer: Self-pay | Admitting: General Surgery

## 2013-11-29 ENCOUNTER — Other Ambulatory Visit: Payer: Self-pay | Admitting: General Surgery

## 2013-11-29 DIAGNOSIS — E785 Hyperlipidemia, unspecified: Secondary | ICD-10-CM

## 2013-11-30 ENCOUNTER — Encounter (HOSPITAL_COMMUNITY): Payer: Medicare Other | Admitting: Anesthesiology

## 2013-11-30 ENCOUNTER — Encounter (HOSPITAL_COMMUNITY)
Admission: RE | Disposition: A | Discharge status: Home / Self Care | Payer: Self-pay | Source: Ambulatory Visit | Attending: Gastroenterology

## 2013-11-30 ENCOUNTER — Encounter (HOSPITAL_COMMUNITY): Payer: Self-pay

## 2013-11-30 ENCOUNTER — Ambulatory Visit (HOSPITAL_COMMUNITY)
Admission: RE | Admit: 2013-11-30 | Discharge: 2013-11-30 | Disposition: A | Discharge status: Home / Self Care | Payer: Medicare Other | Source: Ambulatory Visit | Attending: Gastroenterology | Admitting: Gastroenterology

## 2013-11-30 ENCOUNTER — Ambulatory Visit (HOSPITAL_COMMUNITY): Payer: Medicare Other | Admitting: Anesthesiology

## 2013-11-30 DIAGNOSIS — B188 Other chronic viral hepatitis: Secondary | ICD-10-CM | POA: Diagnosis not present

## 2013-11-30 DIAGNOSIS — F17200 Nicotine dependence, unspecified, uncomplicated: Secondary | ICD-10-CM | POA: Insufficient documentation

## 2013-11-30 DIAGNOSIS — F3289 Other specified depressive episodes: Secondary | ICD-10-CM | POA: Insufficient documentation

## 2013-11-30 DIAGNOSIS — I10 Essential (primary) hypertension: Secondary | ICD-10-CM | POA: Diagnosis not present

## 2013-11-30 DIAGNOSIS — Z8701 Personal history of pneumonia (recurrent): Secondary | ICD-10-CM | POA: Insufficient documentation

## 2013-11-30 DIAGNOSIS — Z96659 Presence of unspecified artificial knee joint: Secondary | ICD-10-CM | POA: Insufficient documentation

## 2013-11-30 DIAGNOSIS — D126 Benign neoplasm of colon, unspecified: Secondary | ICD-10-CM | POA: Diagnosis not present

## 2013-11-30 DIAGNOSIS — G4730 Sleep apnea, unspecified: Secondary | ICD-10-CM | POA: Diagnosis not present

## 2013-11-30 DIAGNOSIS — I2510 Atherosclerotic heart disease of native coronary artery without angina pectoris: Secondary | ICD-10-CM | POA: Diagnosis not present

## 2013-11-30 DIAGNOSIS — K5900 Constipation, unspecified: Secondary | ICD-10-CM | POA: Insufficient documentation

## 2013-11-30 DIAGNOSIS — Z91199 Patient's noncompliance with other medical treatment and regimen due to unspecified reason: Secondary | ICD-10-CM | POA: Diagnosis not present

## 2013-11-30 DIAGNOSIS — K5730 Diverticulosis of large intestine without perforation or abscess without bleeding: Secondary | ICD-10-CM | POA: Insufficient documentation

## 2013-11-30 DIAGNOSIS — F329 Major depressive disorder, single episode, unspecified: Secondary | ICD-10-CM | POA: Diagnosis not present

## 2013-11-30 DIAGNOSIS — I252 Old myocardial infarction: Secondary | ICD-10-CM | POA: Diagnosis not present

## 2013-11-30 DIAGNOSIS — F411 Generalized anxiety disorder: Secondary | ICD-10-CM | POA: Diagnosis not present

## 2013-11-30 DIAGNOSIS — Z9119 Patient's noncompliance with other medical treatment and regimen: Secondary | ICD-10-CM | POA: Diagnosis not present

## 2013-11-30 DIAGNOSIS — Z800 Family history of malignant neoplasm of digestive organs: Secondary | ICD-10-CM | POA: Diagnosis not present

## 2013-11-30 DIAGNOSIS — Z79899 Other long term (current) drug therapy: Secondary | ICD-10-CM | POA: Diagnosis not present

## 2013-11-30 DIAGNOSIS — M47812 Spondylosis without myelopathy or radiculopathy, cervical region: Secondary | ICD-10-CM | POA: Insufficient documentation

## 2013-11-30 DIAGNOSIS — K921 Melena: Secondary | ICD-10-CM | POA: Insufficient documentation

## 2013-11-30 DIAGNOSIS — K648 Other hemorrhoids: Secondary | ICD-10-CM | POA: Insufficient documentation

## 2013-11-30 HISTORY — PX: COLONOSCOPY WITH PROPOFOL: SHX5780

## 2013-11-30 HISTORY — DX: Personal history of other specified conditions: Z87.898

## 2013-11-30 SURGERY — COLONOSCOPY WITH PROPOFOL
Anesthesia: Monitor Anesthesia Care

## 2013-11-30 MED ORDER — PROPOFOL 10 MG/ML IV BOLUS
INTRAVENOUS | Status: DC | PRN
  Administered 2013-11-30 (×2): 40 mg via INTRAVENOUS
  Administered 2013-11-30: 50 mg via INTRAVENOUS
  Administered 2013-11-30: 10 mg via INTRAVENOUS
  Administered 2013-11-30: 40 mg via INTRAVENOUS

## 2013-11-30 MED ORDER — LACTATED RINGERS IV SOLN: INTRAVENOUS | Status: DC

## 2013-11-30 MED ORDER — PROPOFOL 10 MG/ML IV BOLUS
INTRAVENOUS | Status: AC
  Filled 2013-11-30: qty 20

## 2013-11-30 MED ORDER — PROPOFOL INFUSION 10 MG/ML OPTIME
INTRAVENOUS | Status: DC | PRN
  Administered 2013-11-30: 120 ug/kg/min via INTRAVENOUS

## 2013-11-30 MED ORDER — LACTATED RINGERS IV SOLN
INTRAVENOUS | Status: DC | PRN
  Administered 2013-11-30: 10:00:00 via INTRAVENOUS

## 2013-11-30 MED ORDER — SODIUM CHLORIDE 0.9 % IV SOLN: INTRAVENOUS | Status: DC

## 2013-11-30 SURGICAL SUPPLY — 22 items

## 2013-11-30 NOTE — Anesthesia Preprocedure Evaluation (Addendum)
Anesthesia Evaluation    Airway Mallampati: III TM Distance: >3 FB Neck ROM: Full    Dental  (+) Teeth Intact   Pulmonary sleep apnea (Noncompliant with CPAP) , pneumonia -, Current Smoker,  breath sounds clear to auscultation  Pulmonary exam normal       Cardiovascular hypertension, Pt. on medications and Pt. on home beta blockers + angina + CAD and + Past MI Rhythm:Regular Rate:Normal     Neuro/Psych Anxiety Depression Chronic back and leg pain    GI/Hepatic (+) Hepatitis -, A  Endo/Other    Renal/GU      Musculoskeletal   Abdominal   Peds  Hematology   Anesthesia Other Findings   Reproductive/Obstetrics                          Anesthesia Physical Anesthesia Plan  ASA: III  Anesthesia Plan: MAC   Post-op Pain Management:    Induction: Intravenous  Airway Management Planned: Simple Face Mask  Additional Equipment:   Intra-op Plan:   Post-operative Plan: Extubation in OR  Informed Consent: I have reviewed the patients History and Physical, chart, labs and discussed the procedure including the risks, benefits and alternatives for the proposed anesthesia with the patient or authorized representative who has indicated his/her understanding and acceptance.   Dental advisory given  Plan Discussed with: CRNA  Anesthesia Plan Comments:         Anesthesia Quick Evaluation

## 2013-11-30 NOTE — Transfer of Care (Signed)
Immediate Anesthesia Transfer of Care Note  Patient: [name]  Procedure(s) Performed: Procedure(s) (LRB): COLONOSCOPY WITH PROPOFOL (N/A)  Patient Location: PACU  Anesthesia Type: MAC  Level of Consciousness: sedated, patient cooperative and responds to stimulation  Airway & Oxygen Therapy: Patient Spontanous Breathing and Patient connected to face mask oxgen  Post-op Assessment: Report given to PACU RN and Post -op Vital signs reviewed and stable  Post vital signs: Reviewed and stable  Complications: No apparent anesthesia complications

## 2013-11-30 NOTE — Op Note (Addendum)
[place], [id number]   OPERATIVE PROCEDURE REPORT  PATIENT: [name], [name]  MR#: [id number] BIRTHDATE: [birth year] GENDER: Female ENDOSCOPIST:  Edmonia James, MD ASSISTANT:   Cherylynn Ridges, technician Verlon Au, RN, BSN PROCEDURE DATE: 11/30/2013 PRE-PROCEDURE PREPARATION: The patient was prepped with 2 dulcolax tablets, one ten-ounce bottle of magnesium citrate, and a gallon of Golytely the night prior to the procedure.  The patient was fasted for 8 hours prior to the procedure.  PRE-PROCEDURE PHYSICAL: Patient has stable vital signs.  Neck is supple.  There is no JVD, thyromegaly or LAD.  Chest clear to auscultation.  S1 and S2 regular.  Abdomen soft, non-distended, non-tender with NABS. PROCEDURE:     Colonoscopy with biopsies x 3. ASA CLASS:     Class IV INDICATIONS:     1.  Blood in stool 2. Constipation 3. Family history of colon cancer 4. Colorectal cancer screening. MEDICATIONS:     Propofol  400mg  IV  DESCRIPTION OF PROCEDURE: After the risks, benefits, and alternatives of the procedure were thoroughly explained [including a 10% missed rate of cancer and polyps], informed consent was obtained.  Digital rectal exam was performed.  The Pentax video colonoscope [id number]  was introduced through the anus  and advanced to the terminal ileum which was intubated for a short distance , limited by No adverse events experienced.   The quality of the prep was adequate, using Golytely . Multiple washes were done. Small lesions could be missed. The instrument was then slowly withdrawn as the colon was fully examined.     COLON FINDINGS: Three dimunitive polyps were found in the distal descending colon and were removed by cold biopsies x 3. Scattered sigmoid diverticulosis was also noted. The rest of the colon appeared healthy with a normal vascular pattern.  No masses or AVMs were noted. The appendiceal orifice and the ICV were  identified and photographed. The terminal ileum appeared normal.  Retroflexed views revealed moderate sized internal hemorrhoids. The patient tolerated the procedure without immediate complications.  The scope was then withdrawn from the patient and the procedure terminated.  TIME TO CECUM:   6 minutes 0 seconds WITHDRAW TIME:  6 minutes 0 seconds  IMPRESSION:     1.  Three dimunitive polyps were found in the distal descending colon-removed by cold biopsies x 3. 2.  Diverticulosis in the sigmoid colon. 3.  Moderate sized iInternal hemorrhoids   RECOMMENDATIONS:     1.  Hold aspirin, aspirin products, and anti-inflammatory medication for 2 weeks. 2.  Await pathology results. 3.  Continue current medications 4.  High fiber diet with liberal fluid intake. 5.  OP follow-up is advised on a PRN basis.   REPEAT EXAM:      In 5 years  for a repeat colonoscopy.  If the patient has any abnormal GI symptoms in the interim, she have been advised to contact the office as soon as possible for further recommendations.   CPT CODES:     [id number], Colonoscopy with biopsies   DIAGNOSIS CODES:     569.3, 211.3, V16.0, V76.51 Colorectal cancer screening   REFERRED TS:[id number] Julien Girt, M.D.  Darcus Austin, M.D.  eSigned:  Dr. Edmonia James, MD 11/30/2013 10:30 AM Revised: 11/30/2013 10:30 AM  PATIENT NAME:  Sherie, Dobrowolski MR#: [id number]

## 2013-11-30 NOTE — Discharge Instructions (Signed)
Colonoscopy, Care After °These instructions give you information on caring for yourself after your procedure. Your doctor may also give you more specific instructions. Call your doctor if you have any problems or questions after your procedure. °HOME CARE °· Do not drive for 24 hours. °· Do not sign important papers or use machinery for 24 hours. °· You may shower. °· You may go back to your usual activities, but go slower for the first 24 hours. °· Take rest breaks often during the first 24 hours. °· Walk around or use warm packs on your belly (abdomen) if you have belly cramping or gas. °· Drink enough fluids to keep your pee (urine) clear or pale yellow. °· Resume your normal diet. Avoid heavy or fried foods. °· Avoid drinking alcohol for 24 hours or as told by your doctor. °· Only take medicines as told by your doctor. °If a tissue sample (biopsy) was taken during the procedure:  °· Do not take aspirin or blood thinners for 7 days, or as told by your doctor. °· Do not drink alcohol for 7 days, or as told by your doctor. °· Eat soft foods for the first 24 hours. °GET HELP IF: °You still have a small amount of blood in your poop (stool) 2-3 days after the procedure. °GET HELP RIGHT AWAY IF: °· You have more than a small amount of blood in your poop. °· You see clumps of tissue (blood clots) in your poop. °· Your belly is puffy (swollen). °· You feel sick to your stomach (nauseous) or throw up (vomit). °· You have a fever. °· You have belly pain that gets worse and medicine does not help. °MAKE SURE YOU: °· Understand these instructions. °· Will watch your condition. °· Will get help right away if you are not doing well or get worse. °Document Released: 05/23/2010 Document Revised: 04/25/2013 Document Reviewed: 12/26/2012 °Exit[employer]® Patient Information ©2015 [employer]. This information is not intended to replace advice given to you by your health care provider. Make sure you discuss any questions you have with  your health care provider. ° °

## 2013-11-30 NOTE — Anesthesia Postprocedure Evaluation (Signed)
Anesthesia Post Note  Patient: [name]  Procedure(s) Performed: Procedure(s) (LRB): COLONOSCOPY WITH PROPOFOL (N/A)  Anesthesia type: MAC  Patient location: PACU  Post pain: Pain level controlled  Post assessment: Post-op Vital signs reviewed  Last Vitals:  Filed Vitals:   11/30/13 1110  BP: 122/63  Pulse: 48  Temp:   Resp: 14    Post vital signs: Reviewed  Level of consciousness: sedated  Complications: No apparent anesthesia complications

## 2013-11-30 NOTE — H&P (Signed)
[name] is an 58 y.o. female.   Chief Complaint: Colorectal cancer screening. HPI: Patient is here for a colonoscopy. She is being done in a hospital setting due to her multiple co-morbidities. See office notes for further details.  Past Medical History  Diagnosis Date  . Hypertension   . Hyperlipemia   . Chronic pain     "back and legs"  . Anxiety   . Migraine headache   . Hepatitis     Hepatitis A  . Myocardial infarction 06/29/11    "first one"  . Pneumonia   . Depression   . CPAP (continuous positive airway pressure) dependence     bipap not used in 4- 6 months- will bring mask/tubing 11-30-13   . Sleep apnea     bipap used in past - not used in 4-6 months  . Angina     11-22-13 No recent issues-LOV Dr. Radford Pax 11-21-13  . History of weight loss     11-22-13 100 pounds lost over past year ;as intentional weight loss.  . Degenerative joint disease     cervical disc pain wears  Fentanyl Transdermal patch   Past Surgical History  Procedure Laterality Date  . Abdominal hysterectomy    . Abdominal surgery      removal of benign pelvic tumore  . Tmj arthroplasty    . Replacement total knee    . Joint replacement      bilateral knee replacements  . Knee surgery      "multiple knee scopes and OR bilaterally; final outcome bilateral knee replacements"  . Tonsillectomy and adenoidectomy    . Coronary angioplasty with stent placement     Family History  Problem Relation Age of Onset  . Cancer Father   . Heart failure Mother    Social History:  reports that she has been smoking Cigarettes.  She has a .9 pack-year smoking history. She has never used smokeless tobacco. She reports that she drinks alcohol. She reports that she does not use illicit drugs.  Allergies: No Known Allergies  No prescriptions prior to admission    Results for orders placed in visit on 11/28/13 (from the past 48 hour(s))  BASIC METABOLIC PANEL     Status: Abnormal   Collection Time    11/28/13   1:18 PM      Result Value Ref Range   Sodium 142  135 - 145 mEq/L   Potassium 4.0  3.5 - 5.1 mEq/L   Chloride 107  96 - 112 mEq/L   CO2 31  19 - 32 mEq/L   Glucose, Bld 105 (*) 70 - 99 mg/dL   BUN 15  6 - 23 mg/dL   Creatinine, Ser 0.7  0.4 - 1.2 mg/dL   Calcium 8.9  8.4 - 10.5 mg/dL   GFR 89.38  >60.00 mL/min  LIPID PANEL     Status: Abnormal   Collection Time    11/28/13  1:18 PM      Result Value Ref Range   Cholesterol 95  0 - 200 mg/dL   Comment: ATP III Classification       Desirable:  < 200 mg/dL               Borderline High:  200 - 239 mg/dL          High:  > = 240 mg/dL   Triglycerides 69.0  0.0 - 149.0 mg/dL   Comment: Normal:  <150 mg/dLBorderline High:  150 - 199  mg/dL   HDL 38.70 (*) >39.00 mg/dL   VLDL 13.8  0.0 - 40.0 mg/dL   LDL Cholesterol 43  0 - 99 mg/dL   Total CHOL/HDL Ratio 2     Comment:                Men          Women1/2 Average Risk     3.4          3.3Average Risk          5.0          4.42X Average Risk          9.6          7.13X Average Risk          15.0          11.0                       NonHDL 56.30     Comment: NOTE:  Non-HDL goal should be 30 mg/dL higher than patient's LDL goal (i.e. LDL goal of < 70 mg/dL, would have non-HDL goal of < 100 mg/dL)  ALT     Status: Abnormal   Collection Time    11/28/13  1:18 PM      Result Value Ref Range   ALT 36 (*) 0 - 35 U/L   No results found.  Review of Systems  Constitutional: Negative.   HENT: Negative.   Eyes: Negative.   Respiratory: Negative.   Cardiovascular: Negative.   Gastrointestinal: Positive for constipation and blood in stool.  Musculoskeletal: Positive for joint pain.  Neurological: Negative.   Psychiatric/Behavioral: Positive for depression. The patient has insomnia.     There were no vitals taken for this visit. Physical Exam  Constitutional: She is oriented to person, place, and time. She appears well-developed and well-nourished.  HENT:  Head: Normocephalic and atraumatic.   Eyes: Conjunctivae and EOM are normal. Pupils are equal, round, and reactive to light.  Neck: Normal range of motion. Neck supple.  Cardiovascular: Normal rate and regular rhythm.   Respiratory: Effort normal and breath sounds normal.  GI: Soft. Bowel sounds are normal.  Musculoskeletal: Normal range of motion.  Neurological: She is alert and oriented to person, place, and time.  Skin: Skin is warm and dry.  Psychiatric: She has a normal mood and affect. Her behavior is normal. Judgment and thought content normal.    Assessment/Plan Colorectal cancer screening: proceed with a colonoscopy at this time.  Charie Pinkus 11/30/2013, 8:15 AM

## 2013-12-04 ENCOUNTER — Encounter (HOSPITAL_COMMUNITY): Payer: Self-pay | Admitting: Gastroenterology

## 2013-12-12 ENCOUNTER — Telehealth: Payer: Self-pay | Admitting: *Deleted

## 2013-12-12 NOTE — Telephone Encounter (Signed)
Zetia and effient samples placed at the front desk for pick up.

## 2013-12-19 ENCOUNTER — Other Ambulatory Visit (HOSPITAL_COMMUNITY): Payer: Self-pay | Admitting: Psychiatry

## 2013-12-19 ENCOUNTER — Other Ambulatory Visit: Payer: Self-pay | Admitting: Cardiology

## 2013-12-26 ENCOUNTER — Ambulatory Visit: Payer: Medicare Other | Admitting: Physician Assistant

## 2014-01-02 ENCOUNTER — Other Ambulatory Visit: Payer: Self-pay | Admitting: *Deleted

## 2014-01-02 MED ORDER — LISINOPRIL 20 MG PO TABS: 20.0000 mg | ORAL_TABLET | Freq: Every morning | ORAL | Status: DC

## 2014-01-04 ENCOUNTER — Encounter: Payer: Self-pay | Admitting: Physician Assistant

## 2014-01-04 ENCOUNTER — Ambulatory Visit (INDEPENDENT_AMBULATORY_CARE_PROVIDER_SITE_OTHER): Payer: Medicare Other | Admitting: Physician Assistant

## 2014-01-04 VITALS — BP 120/60 | HR 72 | Ht 67.0 in | Wt 178.0 lb

## 2014-01-04 DIAGNOSIS — I10 Essential (primary) hypertension: Secondary | ICD-10-CM

## 2014-01-04 DIAGNOSIS — I2510 Atherosclerotic heart disease of native coronary artery without angina pectoris: Secondary | ICD-10-CM

## 2014-01-04 DIAGNOSIS — E785 Hyperlipidemia, unspecified: Secondary | ICD-10-CM

## 2014-01-04 DIAGNOSIS — I255 Ischemic cardiomyopathy: Secondary | ICD-10-CM

## 2014-01-04 DIAGNOSIS — R42 Dizziness and giddiness: Secondary | ICD-10-CM

## 2014-01-04 DIAGNOSIS — I2589 Other forms of chronic ischemic heart disease: Secondary | ICD-10-CM

## 2014-01-04 DIAGNOSIS — F17200 Nicotine dependence, unspecified, uncomplicated: Secondary | ICD-10-CM

## 2014-01-04 NOTE — Progress Notes (Signed)
Cardiology Office Note   Date:  01/04/2014   ID:  Terriann, Difonzo [birth year], MRN [id number]  PCP:  Marjorie Smolder, MD  Cardiologist:  Dr. Fransico Him     History of Present Illness: [name] is a 58 y.o. female with a hx of CAD s/p NSTEMI tx with DES x 2 LAD in 2013, ICM, HTN, HL.  EF improved to 50% by nuclear study in 2014 and this study demonstrated no ischemia.  Last seen by Dr. Fransico Him 11/2013.  She complained of dizziness.  Lasix dose was reduced.  She returns for FU.  She is feeling better.  Dizziness is significantly improved.  She has occasional dizziness still if she stands too quickly.  She denies syncope.  She denies significant worsening edema.  She denies chest pain.  She denies orthopnea, PND.  She is on disability from chronic leg pain and back pain.  She is limited in her activity.  She denies significant dyspnea.     Studies:  - LHC (2/13):  Mid LAD occluded, R>L collats (dist LAD) and L>L collats (to Dx), OM1 branch occluded (L>L collats), CFX and RCA with irregs >>> PCI:  2.5 x 38 mm Promus and 3.0 x 32 mm Promus Element DES (overlapping) to LAD, EF 30% with ant and apical HK  - Nuclear (8/14):  Ant-septal and inf-septal scar, no ischemia, EF 50%   Recent Labs/Images: 11/28/2013: ALT 36*; Creatinine 0.7; HDL Cholesterol by NMR 38.70*; LDL (calc) 43; Potassium 4.0   Wt Readings from Last 3 Encounters:  01/04/14 178 lb (80.74 kg)  11/21/13 176 lb (79.833 kg)  04/10/13 228 lb (103.42 kg)     Past Medical History  Diagnosis Date  . Hypertension   . Hyperlipemia   . Chronic pain     "back and legs"  . Anxiety   . Migraine headache   . Hepatitis     Hepatitis A  . Myocardial infarction 06/29/11    "first one"  . Pneumonia   . Depression   . CPAP (continuous positive airway pressure) dependence     bipap not used in 4- 6 months- will bring mask/tubing 11-30-13   . Sleep apnea     bipap used in past - not used in 4-6 months  . Angina     11-22-13  No recent issues-LOV Dr. Radford Pax 11-21-13  . History of weight loss     11-22-13 100 pounds lost over past year ;as intentional weight loss.  . Degenerative joint disease     cervical disc pain wears  Fentanyl Transdermal patch    Current Outpatient Prescriptions  Medication Sig Dispense Refill  . ALPRAZolam (XANAX) 0.5 MG tablet Take 0.5 mg by mouth 3 (three) times daily as needed for anxiety.       Marland Kitchen atorvastatin (LIPITOR) 80 MG tablet Take 80 mg by mouth daily.      Marland Kitchen atorvastatin (LIPITOR) 80 MG tablet TAKE 1 TABLET (80 MG TOTAL) BY MOUTH DAILY.  30 tablet  1  . ezetimibe (ZETIA) 10 MG tablet Take 10 mg by mouth every morning.      . fentaNYL (DURAGESIC - DOSED MCG/HR) 50 MCG/HR Place 1 patch onto the skin every other day.      . furosemide (LASIX) 80 MG tablet Take 0.5 tablets (40 mg total) by mouth daily.  45 tablet  1  . isosorbide mononitrate (IMDUR) 60 MG 24 hr tablet Take 60 mg by mouth every morning.      Marland Kitchen  lisinopril (PRINIVIL,ZESTRIL) 20 MG tablet Take 1 tablet (20 mg total) by mouth every morning.  30 tablet  3  . metoprolol tartrate (LOPRESSOR) 25 MG tablet Take 25 mg by mouth 2 (two) times daily.      . nitroGLYCERIN (NITROSTAT) 0.4 MG SL tablet Place 1 tablet (0.4 mg total) under the tongue every 5 (five) minutes as needed for chest pain.  25 tablet  3  . potassium chloride SA (K-DUR,KLOR-CON) 20 MEQ tablet Take 1 tablet (20 mEq total) by mouth 2 (two) times daily.      . prasugrel (EFFIENT) 10 MG TABS Take 10 mg by mouth every morning.       . temazepam (RESTORIL) 30 MG capsule Take 30 mg by mouth at bedtime as needed for sleep.      . Vortioxetine HBr (BRINTELLIX) 20 MG TABS Take 20 mg by mouth every morning.       No current facility-administered medications for this visit.     Allergies:   Review of patient's allergies indicates no known allergies.   Social History:  The patient  reports that she has been smoking Cigarettes.  She has a .9 pack-year smoking history. She  has never used smokeless tobacco. She reports that she drinks alcohol. She reports that she does not use illicit drugs.   Family History:  The patient's family history includes Cancer in her father; Heart attack in her mother; Heart failure in her mother.   ROS:  Please see the history of present illness.      All other systems reviewed and negative.   PHYSICAL EXAM: VS:  BP 120/60  Pulse 72  Ht 5\' 7"  (1.702 m)  Wt 178 lb (80.74 kg)  BMI 27.87 kg/m2 Well nourished, well developed, in no acute distress HEENT: normal Neck: no JVD Cardiac:  normal S1, S2; RRR; no murmur Lungs:  Decreased breath sonds bilaterally, no wheezing, rhonchi or rales Abd: soft, nontender, no hepatomegaly Ext: trace bilateral LE edema Skin: warm and dry Neuro:  CNs 2-12 intact, no focal abnormalities noted      ASSESSMENT AND PLAN:  1. Dizziness:  Much improved on lower dose of Lasix.  No evidence of volume excess.  Recent labs stable.  Continue current Rx. 2. Atherosclerosis of native coronary artery of native heart without angina pectoris:  No angina.  Continue statin, Effient, nitrates, beta blocker.   3. Cardiomyopathy, ischemic: Continue nitrates, ACEI, beta blocker. 4. Essential hypertension:  Controlled.  5. Dyslipidemia:  Continue statin. 6. TOBACCO USER:  She has been unable to quit.  Disposition:  FU with Dr. Fransico Him in October as planned.    Signed, Versie Starks, MHS 01/04/2014 2:21 PM    [facility] [address], [place], [place]  [address] Phone: [phone]; Fax: [phone]

## 2014-01-04 NOTE — Patient Instructions (Signed)
NO CHANGES WERE MADE TODAY  KEEP YOUR FOLLOW UP WITH DR. Radford Pax

## 2014-01-16 ENCOUNTER — Ambulatory Visit (HOSPITAL_BASED_OUTPATIENT_CLINIC_OR_DEPARTMENT_OTHER): Payer: Medicare Other

## 2014-01-18 ENCOUNTER — Other Ambulatory Visit: Payer: Self-pay | Admitting: *Deleted

## 2014-01-18 MED ORDER — POTASSIUM CHLORIDE CRYS ER 20 MEQ PO TBCR: 20.0000 meq | EXTENDED_RELEASE_TABLET | Freq: Two times a day (BID) | ORAL | Status: DC

## 2014-01-21 ENCOUNTER — Other Ambulatory Visit: Payer: Self-pay | Admitting: Cardiology

## 2014-01-25 ENCOUNTER — Other Ambulatory Visit: Payer: Self-pay

## 2014-01-25 MED ORDER — ISOSORBIDE MONONITRATE ER 60 MG PO TB24: 60.0000 mg | ORAL_TABLET | Freq: Every morning | ORAL | Status: DC

## 2014-02-14 ENCOUNTER — Ambulatory Visit (HOSPITAL_BASED_OUTPATIENT_CLINIC_OR_DEPARTMENT_OTHER): Payer: Medicare Other | Attending: Cardiology | Admitting: Radiology

## 2014-02-14 VITALS — Ht 67.0 in | Wt 170.0 lb

## 2014-02-14 DIAGNOSIS — G4733 Obstructive sleep apnea (adult) (pediatric): Secondary | ICD-10-CM | POA: Insufficient documentation

## 2014-02-14 DIAGNOSIS — G4730 Sleep apnea, unspecified: Secondary | ICD-10-CM

## 2014-02-17 ENCOUNTER — Other Ambulatory Visit: Payer: Self-pay | Admitting: Cardiology

## 2014-02-19 NOTE — Sleep Study (Signed)
         NAME:  [name] DATE OF STUDY:  02/14/2014 MEDICAL RECORD NUMBER [id number] LOCATION:  [facility] READING PHYSICIAN:  Fransico Him, MD ORDERING PHYSICIAN:  Fransico Him, MD DATE OF BIRTH:  08/30/1955  SLEEP STUDY TYPE:Nocturnal Polysomnogram  INDICATION FOR SLEEP STUDY:  History of OSA and now off CPAP due to weight loss.  Now with increased daytime fatigue and sleepiness with frequent awakenings during sleep.  EPWORTH SLEEPINESS SCALE:  10 HEIGHT: 5'7" WEIGHT: 170lbs BMI: 27 NECK SIZE: 15"  MEDICATIONS:  Reviewed in the sleep record  SLEEP ARCHITECTURE:  The patient had a total sleep time of 372 minutes.  Sleep onset was 14 minutes and REM onset was delayed at 237 minutes.  Sleep efficiency was normal at 88.7%.  RESPIRATORY DATA:  The patient was found to have 5 apneas and 68 obstructive hypopneas, giving her an AHI of 11.8 events per hour.  The events occurred primarily in the supine position and during NREM sleep.  There was moderate snoring.  OXYGEN DATA:  The patient had Oxygen desaturations as low as 86% with her obstructive events.  CARDIAC DATA:  The patient was noted to have occasional PVC's and PAC's during sleep.  MOVEMENT/PARASOMNIA:  No Periodic limb movements or abnormal behaviors were noted.  IMPRESSION/RECOMMENDATIONS:  1.  Mild Obstructive Apnea/hypopnea syndrome, with an AHI of 11.8 events per hour and oxygen desaturations as low as 86%.  Given the degree of daytime sleepiness and poor sleep with elevated Epworth sleepiness scale, recommend a trial of CPAP therapy.  Recommend that patient be set up for in lab CPAP titration.  2.  The patient should be counseled to avoid sleeping supine

## 2014-02-20 ENCOUNTER — Telehealth: Payer: Self-pay | Admitting: Cardiology

## 2014-02-20 ENCOUNTER — Encounter: Payer: Self-pay | Admitting: Cardiology

## 2014-02-20 ENCOUNTER — Ambulatory Visit (INDEPENDENT_AMBULATORY_CARE_PROVIDER_SITE_OTHER): Payer: Medicare Other | Admitting: Cardiology

## 2014-02-20 VITALS — BP 104/68 | HR 74 | Ht 67.0 in | Wt 170.0 lb

## 2014-02-20 DIAGNOSIS — Z23 Encounter for immunization: Secondary | ICD-10-CM

## 2014-02-20 DIAGNOSIS — I2510 Atherosclerotic heart disease of native coronary artery without angina pectoris: Secondary | ICD-10-CM

## 2014-02-20 DIAGNOSIS — G4733 Obstructive sleep apnea (adult) (pediatric): Secondary | ICD-10-CM

## 2014-02-20 DIAGNOSIS — I10 Essential (primary) hypertension: Secondary | ICD-10-CM

## 2014-02-20 DIAGNOSIS — I255 Ischemic cardiomyopathy: Secondary | ICD-10-CM

## 2014-02-20 DIAGNOSIS — E785 Hyperlipidemia, unspecified: Secondary | ICD-10-CM

## 2014-02-20 LAB — BASIC METABOLIC PANEL
BUN: 16 mg/dL (ref 6–23)
CALCIUM: 9.4 mg/dL (ref 8.4–10.5)
CO2: 25 mEq/L (ref 19–32)
Chloride: 105 mEq/L (ref 96–112)
Creatinine, Ser: 0.8 mg/dL (ref 0.4–1.2)
GFR: 78.95 mL/min (ref >60.00)
Glucose, Bld: 99 mg/dL (ref 70–99)
Potassium: 4.2 mEq/L (ref 3.5–5.1)
SODIUM: 141 meq/L (ref 135–145)

## 2014-02-20 MED ORDER — EZETIMIBE 10 MG PO TABS: 10.0000 mg | ORAL_TABLET | Freq: Every morning | ORAL | Status: DC

## 2014-02-20 NOTE — Telephone Encounter (Signed)
Please let patient know that she has mild OSA and set up for CPAP titration

## 2014-02-20 NOTE — Patient Instructions (Signed)
Your physician recommends that you continue on your current medications as directed. Please refer to the Current Medication list given to you today.  Your physician recommends that you have lab work:  TODAY  Your physician wants you to follow-up in: 6 months with Dr. Radford Pax.  You will receive a reminder letter in the mail two months in advance. If you don't receive a letter, please call our office to schedule the follow-up appointment.

## 2014-02-20 NOTE — Progress Notes (Signed)
[address] [place], [place]  [address] Phone: [phone] Fax:  [phone]  Date:  02/20/2014   ID:  [name], [name] 10/23/1955, MRN [id number]  PCP:  Marjorie Smolder, MD  Cardiologist:  Fransico Him, MD    History of Present Illness: [name] is a 58 y.o. female with a hx of CAD s/p NSTEMI tx with DES x 2 LAD in 2013, ICM, HTN, HL. EF improved to 50% by nuclear study in 2014 and this study demonstrated no ischemia.  She recently underwent a sleep study which showed mild OSA with AHI 11.8 events per hour.  She denies any chest pain or SOB, dizziness, palpitations or syncope.  Occasionally she will have some mild LE edema.  Wt Readings from Last 3 Encounters:  02/20/14 170 lb (77.111 kg)  02/14/14 170 lb (77.111 kg)  01/04/14 178 lb (80.74 kg)     Past Medical History  Diagnosis Date  . Hypertension   . Hyperlipemia   . Chronic pain     "back and legs"  . Anxiety   . Migraine headache   . Hepatitis     Hepatitis A  . Myocardial infarction 06/29/11    "first one"  . Pneumonia   . Depression   . CPAP (continuous positive airway pressure) dependence     bipap not used in 4- 6 months- will bring mask/tubing 11-30-13   . Sleep apnea     bipap used in past - not used in 4-6 months  . Angina     11-22-13 No recent issues-LOV Dr. Radford Pax 11-21-13  . History of weight loss     11-22-13 100 pounds lost over past year ;as intentional weight loss.  . Degenerative joint disease     cervical disc pain wears  Fentanyl Transdermal patch    Current Outpatient Prescriptions  Medication Sig Dispense Refill  . ALPRAZolam (XANAX) 0.5 MG tablet Take 0.5 mg by mouth 3 (three) times daily as needed for anxiety.       Marland Kitchen aspirin 81 MG tablet Take 81 mg by mouth daily.      Marland Kitchen atorvastatin (LIPITOR) 80 MG tablet TAKE 1 TABLET (80 MG TOTAL) BY MOUTH DAILY.  30 tablet  3  . ezetimibe (ZETIA) 10 MG tablet Take 10 mg by mouth every morning.      . fentaNYL (DURAGESIC - DOSED  MCG/HR) 50 MCG/HR Place 1 patch onto the skin every other day.      . furosemide (LASIX) 80 MG tablet Take 0.5 tablets (40 mg total) by mouth daily.  45 tablet  1  . gabapentin (NEURONTIN) 300 MG capsule       . isosorbide mononitrate (IMDUR) 60 MG 24 hr tablet Take 1 tablet (60 mg total) by mouth every morning.  30 tablet  3  . lisinopril (PRINIVIL,ZESTRIL) 20 MG tablet Take 1 tablet (20 mg total) by mouth every morning.  30 tablet  3  . metoprolol tartrate (LOPRESSOR) 25 MG tablet Take 25 mg by mouth 2 (two) times daily.      . metoprolol tartrate (LOPRESSOR) 25 MG tablet TAKE 1 TABLET BY MOUTH TWICE A DAY  180 tablet  0  . nitroGLYCERIN (NITROSTAT) 0.4 MG SL tablet Place 1 tablet (0.4 mg total) under the tongue every 5 (five) minutes as needed for chest pain.  25 tablet  3  . potassium chloride SA (K-DUR,KLOR-CON) 20 MEQ tablet Take 1 tablet (20 mEq total) by mouth 2 (two)  times daily.  60 tablet  1  . prasugrel (EFFIENT) 10 MG TABS Take 10 mg by mouth every morning.       . temazepam (RESTORIL) 30 MG capsule Take 30 mg by mouth at bedtime as needed for sleep.      . Vortioxetine HBr (BRINTELLIX) 20 MG TABS Take 20 mg by mouth every morning.       No current facility-administered medications for this visit.    Allergies:   No Known Allergies  Social History:  The patient  reports that she has quit smoking. Her smoking use included Cigarettes. She started smoking about 3 weeks ago. She has a .9 pack-year smoking history. She has never used smokeless tobacco. She reports that she drinks alcohol. She reports that she does not use illicit drugs.   Family History:  The patient's family history includes Cancer in her father; Heart attack in her mother; Heart failure in her mother.   ROS:  Please see the history of present illness.      All other systems reviewed and negative.   PHYSICAL EXAM: VS:  BP 104/68  Pulse 74  Ht 5\' 7"  (1.702 m)  Wt 170 lb (77.111 kg)  BMI 26.62 kg/m2 Well  nourished, well developed, in no acute distress HEENT: normal Neck: no JVD Cardiac:  normal S1, S2; RRR; no murmur Lungs:  clear to auscultation bilaterally, no wheezing, rhonchi or rales Abd: soft, nontender, no hepatomegaly Ext: no edema Skin: warm and dry Neuro:  CNs 2-12 intact, no focal abnormalities noted   ASSESSMENT AND PLAN:  1. Dizziness: Resolved on lower dose of Lasix. No evidence of volume excess. Recent labs stable. Continue current Rx. 2. Atherosclerosis of native coronary artery of native heart without angina pectoris: No angina. Continue statin, Effient, ASA, nitrates, beta blocker.  3. Cardiomyopathy, ischemic: Continue nitrates, ACEI, beta blocker, Lasix - check BMET 4. Essential hypertension: Controlled.  5. Dyslipidemia: Continue statin. LDL at goal at 43 6. TOBACCO USER: She uses the e cigarretes now 7. Mild OSA - her recent sleep study showed an AHI of 11/hr mainly in the supine position.  She does not want to use CPAP so I have recommended that we refer her to Dr. Toy Cookey DDS for evaluation for a mouth guard.  She does not normally sleep supine and will continue to sleeping on her back.  Followup with me in  6 months   Signed, Fransico Him, MD [facility] 02/20/2014 11:54 AM

## 2014-02-20 NOTE — Telephone Encounter (Signed)
Pt informed. Pt st she will talk with Dr. Radford Pax about CPAP at [facility] today.  Pt st the technician who did her study said she only has mild sleep apnea when she lies flat on her back, and she doesn't sleep like that.  Pt does not think she wants CPAP.

## 2014-02-22 NOTE — Progress Notes (Signed)
informed

## 2014-02-22 NOTE — Progress Notes (Signed)
Patient informed. 

## 2014-03-05 ENCOUNTER — Telehealth: Payer: Self-pay

## 2014-03-05 NOTE — Telephone Encounter (Signed)
PATIENT REQUESTING SAMPLES OF ZETIA AND EFFIENT PLACED A MONTH SUPPLY UP FRONT

## 2014-03-09 ENCOUNTER — Other Ambulatory Visit: Payer: Self-pay

## 2014-03-09 MED ORDER — POTASSIUM CHLORIDE CRYS ER 20 MEQ PO TBCR: 20.0000 meq | EXTENDED_RELEASE_TABLET | Freq: Two times a day (BID) | ORAL | Status: DC

## 2014-04-12 ENCOUNTER — Other Ambulatory Visit: Payer: Medicare Other

## 2014-04-12 ENCOUNTER — Encounter (HOSPITAL_COMMUNITY): Payer: Self-pay | Admitting: Interventional Cardiology

## 2014-04-16 ENCOUNTER — Other Ambulatory Visit: Payer: Self-pay

## 2014-04-16 MED ORDER — EZETIMIBE 10 MG PO TABS: 10.0000 mg | ORAL_TABLET | Freq: Every morning | ORAL | Status: DC

## 2014-04-16 NOTE — Telephone Encounter (Signed)
Patient called for samples of zetia nad effeint, we did not have zetia samples. I placed a month supply of effeint at front desk

## 2014-04-18 ENCOUNTER — Other Ambulatory Visit: Payer: Self-pay | Admitting: Cardiology

## 2014-04-23 ENCOUNTER — Other Ambulatory Visit: Payer: Self-pay | Admitting: Cardiology

## 2014-05-10 ENCOUNTER — Other Ambulatory Visit: Payer: Self-pay | Admitting: Cardiology

## 2014-05-10 ENCOUNTER — Telehealth: Payer: Self-pay

## 2014-05-10 NOTE — Telephone Encounter (Signed)
refill 

## 2014-05-15 ENCOUNTER — Other Ambulatory Visit: Payer: Self-pay | Admitting: Cardiology

## 2014-06-01 ENCOUNTER — Encounter: Payer: Self-pay | Admitting: Cardiology

## 2014-06-01 ENCOUNTER — Other Ambulatory Visit: Payer: Medicare Other

## 2014-06-29 ENCOUNTER — Telehealth: Payer: Self-pay | Admitting: Cardiology

## 2014-06-29 NOTE — Telephone Encounter (Signed)
Letter placed in Medical Records "to be faxed" bin.

## 2014-06-29 NOTE — Telephone Encounter (Signed)
New problem   Want to know status of Letter that was faxed to help pt with insurance for oral appliance for her sleep apnea. Will refax just in case if we didn't receive it. Please advise. Form need to be signed by Dr Radford Pax

## 2014-07-06 ENCOUNTER — Telehealth: Payer: Self-pay | Admitting: *Deleted

## 2014-07-06 NOTE — Telephone Encounter (Signed)
Effient and zetia samples placed at the front desk for patient.

## 2014-07-18 ENCOUNTER — Other Ambulatory Visit: Payer: Self-pay | Admitting: Cardiology

## 2014-08-21 ENCOUNTER — Ambulatory Visit (INDEPENDENT_AMBULATORY_CARE_PROVIDER_SITE_OTHER): Payer: Medicare Other | Admitting: Cardiology

## 2014-08-21 ENCOUNTER — Encounter: Payer: Self-pay | Admitting: Cardiology

## 2014-08-21 VITALS — BP 100/48 | HR 97 | Ht 68.0 in | Wt 162.0 lb

## 2014-08-21 DIAGNOSIS — I255 Ischemic cardiomyopathy: Secondary | ICD-10-CM | POA: Diagnosis not present

## 2014-08-21 DIAGNOSIS — I10 Essential (primary) hypertension: Secondary | ICD-10-CM | POA: Diagnosis not present

## 2014-08-21 DIAGNOSIS — I2510 Atherosclerotic heart disease of native coronary artery without angina pectoris: Secondary | ICD-10-CM | POA: Diagnosis not present

## 2014-08-21 DIAGNOSIS — G4733 Obstructive sleep apnea (adult) (pediatric): Secondary | ICD-10-CM | POA: Diagnosis not present

## 2014-08-21 DIAGNOSIS — E785 Hyperlipidemia, unspecified: Secondary | ICD-10-CM

## 2014-08-21 DIAGNOSIS — Z720 Tobacco use: Secondary | ICD-10-CM

## 2014-08-21 DIAGNOSIS — F17200 Nicotine dependence, unspecified, uncomplicated: Secondary | ICD-10-CM

## 2014-08-21 MED ORDER — LISINOPRIL 10 MG PO TABS: 10.0000 mg | ORAL_TABLET | Freq: Every day | ORAL | Status: DC

## 2014-08-21 MED ORDER — CLOPIDOGREL BISULFATE 75 MG PO TABS: 75.0000 mg | ORAL_TABLET | Freq: Every day | ORAL | Status: DC

## 2014-08-21 NOTE — Progress Notes (Signed)
Cardiology Office Note   Date:  08/21/2014   ID:  Jacqueline, Mckee [birth year], MRN [id number]  PCP:  Marjorie Smolder, MD    Chief Complaint  Patient presents with  . Coronary Artery Disease  . Follow-up    hypertension  . Cardiomyopathy      History of Present Illness: [name] is a 59 y.o. female with a hx of CAD s/p NSTEMI tx with DES x 2 LAD in 2013, ICM, HTN, HL. EF improved to 50% by nuclear study in 2014 and this study demonstrated no ischemia. She has a history of mild OSA with AHI 11.8 events per hour.It was recommended that she get set up for a CPAP titration but she did not want to proceed.    She denies any chest pain or SOB, dizziness, palpitations or syncope. Occasionally she will have some mild LE edema which is stable.       Past Medical History  Diagnosis Date  . Hypertension   . Hyperlipemia   . Chronic pain     "back and legs"  . Anxiety   . Migraine headache   . Hepatitis     Hepatitis A  . Myocardial infarction 06/29/11  . Pneumonia   . Depression   . Sleep apnea     declined CPAP  . History of weight loss     11-22-13 100 pounds lost over past year ;as intentional weight loss.  . Degenerative joint disease     cervical disc pain wears  Fentanyl Transdermal patch  . CAD (coronary artery disease), native coronary artery     s/p NSTEMI tx with DES x 2 LAD in 2013    Past Surgical History  Procedure Laterality Date  . Abdominal hysterectomy    . Abdominal surgery      removal of benign pelvic tumore  . Tmj arthroplasty    . Replacement total knee    . Joint replacement      bilateral knee replacements  . Knee surgery      "multiple knee scopes and OR bilaterally; final outcome bilateral knee replacements"  . Tonsillectomy and adenoidectomy    . Coronary angioplasty with stent placement    . Colonoscopy with propofol N/A 11/30/2013    Procedure: COLONOSCOPY WITH PROPOFOL;  Surgeon: Juanita Craver, MD;  Location: [facility];   Service: Endoscopy;  Laterality: N/A;  . Left heart catheterization with coronary angiogram N/A 06/30/2011    Procedure: LEFT HEART CATHETERIZATION WITH CORONARY ANGIOGRAM;  Surgeon: Jettie Booze, MD;  Location: [facility] CATH LAB;  Service: Cardiovascular;  Laterality: N/A;  . Percutaneous coronary stent intervention (pci-s)  06/30/2011    Procedure: PERCUTANEOUS CORONARY STENT INTERVENTION (PCI-S);  Surgeon: Jettie Booze, MD;  Location: [facility] CATH LAB;  Service: Cardiovascular;;     Current Outpatient Prescriptions  Medication Sig Dispense Refill  . ALPRAZolam (XANAX) 0.5 MG tablet Take 0.5 mg by mouth 3 (three) times daily as needed for anxiety.     Marland Kitchen aspirin 81 MG tablet Take 81 mg by mouth daily.    Marland Kitchen atorvastatin (LIPITOR) 80 MG tablet TAKE 1 TABLET (80 MG TOTAL) BY MOUTH DAILY. 30 tablet 3  . docusate sodium (COLACE) 100 MG capsule Take 100 mg by mouth daily.    Marland Kitchen ezetimibe (ZETIA) 10 MG tablet Take 1 tablet (10 mg total) by mouth every morning. 30 tablet 1  . fentaNYL (DURAGESIC - DOSED MCG/HR) 50 MCG/HR Place 1 patch onto the skin every  other day.    . furosemide (LASIX) 40 MG tablet Take 40 mg by mouth daily.  0  . gabapentin (NEURONTIN) 300 MG capsule     . ibuprofen (ADVIL,MOTRIN) 800 MG tablet Take 800 mg by mouth daily.    . isosorbide mononitrate (IMDUR) 60 MG 24 hr tablet Take 1 tablet (60 mg total) by mouth every morning. 30 tablet 3  . KLOR-CON M20 20 MEQ tablet TAKE 1 TABLET (20 MEQ TOTAL) BY MOUTH 2 (TWO) TIMES DAILY. 60 tablet 1  . lisinopril (PRINIVIL,ZESTRIL) 20 MG tablet TAKE 1 TABLET (20 MG TOTAL) BY MOUTH EVERY MORNING. 30 tablet 6  . metoprolol tartrate (LOPRESSOR) 25 MG tablet Take 25 mg by mouth 2 (two) times daily.    . nitroGLYCERIN (NITROSTAT) 0.4 MG SL tablet Place 1 tablet (0.4 mg total) under the tongue every 5 (five) minutes as needed for chest pain. 25 tablet 3  . prasugrel (EFFIENT) 10 MG TABS Take 10 mg by mouth every morning.     Marland Kitchen PROAIR HFA 108 (90  BASE) MCG/ACT inhaler Inhale 2 puffs into the lungs as needed. For wheezing  0  . temazepam (RESTORIL) 30 MG capsule Take 30 mg by mouth at bedtime as needed for sleep.     No current facility-administered medications for this visit.    Allergies:   Review of patient's allergies indicates no known allergies.    Social History:  The patient  reports that she has quit smoking. Her smoking use included Cigarettes. She started smoking about 6 months ago. She has a .9 pack-year smoking history. She has never used smokeless tobacco. She reports that she drinks alcohol. She reports that she does not use illicit drugs.   Family History:  The patient's family history includes Cancer in her father; Heart attack in her mother; Heart failure in her mother.    ROS:  Please see the history of present illness.   Otherwise, review of systems are positive for fatigue, depression, back pain, headaches and hand numbness. .   All other systems are reviewed and negative.    PHYSICAL EXAM: VS:  BP 100/48 mmHg  Pulse 97  Ht 5\' 8"  (1.727 m)  Wt 162 lb (73.483 kg)  BMI 24.64 kg/m2  SpO2 94% , BMI Body mass index is 24.64 kg/(m^2). GEN: Well nourished, well developed, in no acute distress HEENT: normal Neck: no JVD, carotid bruits, or masses Cardiac: RRR; no murmurs, rubs, or gallops,no edema  Respiratory:  clear to auscultation bilaterally, normal work of breathing GI: soft, nontender, nondistended, + BS MS: no deformity or atrophy Skin: warm and dry, no rash Neuro:  Strength and sensation are intact Psych: euthymic mood, full affect   EKG:  EKG is not ordered today.    Recent Labs: 11/28/2013: ALT 36* 02/20/2014: BUN 16; Creatinine 0.8; Potassium 4.2; Sodium 141    Lipid Panel    Component Value Date/Time   CHOL 95 11/28/2013 1318   TRIG 69.0 11/28/2013 1318   HDL 38.70* 11/28/2013 1318   CHOLHDL 2 11/28/2013 1318   VLDL 13.8 11/28/2013 1318   LDLCALC 43 11/28/2013 1318      Wt  Readings from Last 3 Encounters:  08/21/14 162 lb (73.483 kg)  02/20/14 170 lb (77.111 kg)  02/14/14 170 lb (77.111 kg)     ASSESSMENT AND PLAN:  1. Dizziness:occasionally it will occur but improved on lower dose of Lasix. No evidence of volume excess. Recent labs stable. She does not want to decrease  her Lasix further.  Will decrease Lisinopril to 10mg  daily.   2. Atherosclerosis of native coronary artery of native heart without angina pectoris: No angina. Continue statin, ASA, nitrates, beta blocker. Change Effient to Plavix due to cost issues.   3. Cardiomyopathy, ischemic: Continue nitrates, ACEI, beta blocker, Lasix - check BMET - decrease Lisinopril to 10mg  daily due to orthostatic dizziness 4. Essential hypertension: Controlled.  5. Dyslipidemia: Continue statin. LDL at goal at 43.  Recheck FLP and ALT 6. TOBACCO USER: She uses the e cigarretes now       7.  Mild OSA - her recent sleep study showed an AHI of 11/hr mainly in the supine position. She declined Engineer, maintenance (IT).  She is no wearing a mouth guard.    Current medicines are reviewed at length with the patient today.  The patient does not have concerns regarding medicines.  The following changes have been made:   Change effient to Plavix.  Decrease lisinopril to 10mg  daily.   Labs/ tests ordered today include: see above assessment and plan No orders of the defined types were placed in this encounter.     Disposition:   FU with me in 6 months   Signed, Sueanne Margarita, MD  08/21/2014 4:08 PM    [facility] [address], [place], [place]  [address] Phone: [phone]; Fax: [phone]

## 2014-08-21 NOTE — Patient Instructions (Addendum)
Medication Instructions:  Your physician has recommended you make the following change in your medication:  1) STOP EFFIENT 2) START PLAVIX 75 mg daily 3) DECREASE LISINOPRIL to 10 mg daily  Labwork: Your physician recommends that you return for FASTING lab work on: Wednesday May 4 (BMET, LFTs, Lipids)  Testing/Procedures: None   Follow-Up: Your physician wants you to follow-up in: 6 months with Dr. Radford Pax. You will receive a reminder letter in the mail two months in advance. If you don't receive a letter, please call our office to schedule the follow-up appointment.

## 2014-09-05 ENCOUNTER — Other Ambulatory Visit (INDEPENDENT_AMBULATORY_CARE_PROVIDER_SITE_OTHER): Payer: Medicare Other | Admitting: *Deleted

## 2014-09-05 DIAGNOSIS — I10 Essential (primary) hypertension: Secondary | ICD-10-CM | POA: Diagnosis not present

## 2014-09-05 DIAGNOSIS — E785 Hyperlipidemia, unspecified: Secondary | ICD-10-CM

## 2014-09-05 LAB — LIPID PANEL
CHOLESTEROL: 109 mg/dL (ref 0–200)
HDL: 39.6 mg/dL (ref >39.00)
LDL CALC: 59 mg/dL (ref 0–99)
NonHDL: 69.4
Total CHOL/HDL Ratio: 3
Triglycerides: 51 mg/dL (ref 0.0–149.0)
VLDL: 10.2 mg/dL (ref 0.0–40.0)

## 2014-09-05 LAB — BASIC METABOLIC PANEL
BUN: 17 mg/dL (ref 6–23)
CALCIUM: 9.3 mg/dL (ref 8.4–10.5)
CO2: 30 meq/L (ref 19–32)
CREATININE: 0.76 mg/dL (ref 0.40–1.20)
Chloride: 104 mEq/L (ref 96–112)
GFR: 82.41 mL/min (ref >60.00)
Glucose, Bld: 102 mg/dL — ABNORMAL HIGH (ref 70–99)
Potassium: 3.8 mEq/L (ref 3.5–5.1)
SODIUM: 139 meq/L (ref 135–145)

## 2014-09-05 LAB — HEPATIC FUNCTION PANEL
ALT: 27 U/L (ref 0–35)
AST: 20 U/L (ref 0–37)
Albumin: 3.9 g/dL (ref 3.5–5.2)
Alkaline Phosphatase: 58 U/L (ref 39–117)
BILIRUBIN DIRECT: 0.1 mg/dL (ref 0.0–0.3)
TOTAL PROTEIN: 6.3 g/dL (ref 6.0–8.3)
Total Bilirubin: 0.5 mg/dL (ref 0.2–1.2)

## 2014-09-05 NOTE — Addendum Note (Signed)
Addended by: Eulis Foster on: 09/05/2014 10:47 AM   Modules accepted: Orders

## 2014-09-10 ENCOUNTER — Encounter: Payer: Self-pay | Admitting: Cardiology

## 2014-09-10 NOTE — Telephone Encounter (Signed)
Follow up  ° ° ° °Returning call back to nurse  °

## 2014-09-10 NOTE — Telephone Encounter (Signed)
This encounter was created in error - please disregard.

## 2014-09-17 ENCOUNTER — Other Ambulatory Visit: Payer: Self-pay

## 2014-09-17 MED ORDER — LISINOPRIL 10 MG PO TABS: 10.0000 mg | ORAL_TABLET | Freq: Every day | ORAL | Status: DC

## 2014-09-17 NOTE — Telephone Encounter (Signed)
Jacqueline Margarita, MD at 08/21/2014 1:53 PM  lisinopril (PRINIVIL,ZESTRIL) 20 MG tablet TAKE 1 TABLET (20 MG TOTAL) BY MOUTH EVERY MORNING 1. Cardiomyopathy, ischemic: Continue nitrates, ACEI, beta blocker, Lasix - check BMET - decrease Lisinopril to 10mg  daily due to orthostatic dizziness

## 2014-10-09 ENCOUNTER — Encounter (HOSPITAL_COMMUNITY): Payer: Self-pay | Admitting: Emergency Medicine

## 2014-10-09 ENCOUNTER — Other Ambulatory Visit: Payer: Self-pay | Admitting: Cardiology

## 2014-10-09 ENCOUNTER — Observation Stay (HOSPITAL_COMMUNITY)
Admission: EM | Admit: 2014-10-09 | Discharge: 2014-10-10 | Disposition: A | Discharge status: Home / Self Care | Payer: Medicare Other | Attending: Family Medicine | Admitting: Family Medicine

## 2014-10-09 ENCOUNTER — Emergency Department (HOSPITAL_COMMUNITY): Payer: Medicare Other

## 2014-10-09 DIAGNOSIS — Z7982 Long term (current) use of aspirin: Secondary | ICD-10-CM | POA: Diagnosis not present

## 2014-10-09 DIAGNOSIS — I4891 Unspecified atrial fibrillation: Secondary | ICD-10-CM | POA: Diagnosis present

## 2014-10-09 DIAGNOSIS — I10 Essential (primary) hypertension: Secondary | ICD-10-CM

## 2014-10-09 DIAGNOSIS — Z720 Tobacco use: Secondary | ICD-10-CM

## 2014-10-09 DIAGNOSIS — I2510 Atherosclerotic heart disease of native coronary artery without angina pectoris: Secondary | ICD-10-CM

## 2014-10-09 DIAGNOSIS — I519 Heart disease, unspecified: Secondary | ICD-10-CM

## 2014-10-09 DIAGNOSIS — E785 Hyperlipidemia, unspecified: Secondary | ICD-10-CM

## 2014-10-09 DIAGNOSIS — Z87891 Personal history of nicotine dependence: Secondary | ICD-10-CM | POA: Insufficient documentation

## 2014-10-09 DIAGNOSIS — Z96653 Presence of artificial knee joint, bilateral: Secondary | ICD-10-CM | POA: Insufficient documentation

## 2014-10-09 DIAGNOSIS — Z955 Presence of coronary angioplasty implant and graft: Secondary | ICD-10-CM | POA: Insufficient documentation

## 2014-10-09 DIAGNOSIS — Z809 Family history of malignant neoplasm, unspecified: Secondary | ICD-10-CM | POA: Diagnosis not present

## 2014-10-09 DIAGNOSIS — F419 Anxiety disorder, unspecified: Secondary | ICD-10-CM | POA: Insufficient documentation

## 2014-10-09 DIAGNOSIS — F329 Major depressive disorder, single episode, unspecified: Secondary | ICD-10-CM | POA: Diagnosis not present

## 2014-10-09 DIAGNOSIS — R42 Dizziness and giddiness: Secondary | ICD-10-CM | POA: Diagnosis present

## 2014-10-09 DIAGNOSIS — Z90710 Acquired absence of both cervix and uterus: Secondary | ICD-10-CM | POA: Diagnosis not present

## 2014-10-09 DIAGNOSIS — I252 Old myocardial infarction: Secondary | ICD-10-CM | POA: Diagnosis not present

## 2014-10-09 DIAGNOSIS — G43909 Migraine, unspecified, not intractable, without status migrainosus: Secondary | ICD-10-CM | POA: Diagnosis not present

## 2014-10-09 DIAGNOSIS — I480 Paroxysmal atrial fibrillation: Secondary | ICD-10-CM

## 2014-10-09 DIAGNOSIS — F17200 Nicotine dependence, unspecified, uncomplicated: Secondary | ICD-10-CM | POA: Diagnosis present

## 2014-10-09 DIAGNOSIS — R55 Syncope and collapse: Secondary | ICD-10-CM | POA: Diagnosis present

## 2014-10-09 LAB — BASIC METABOLIC PANEL
ANION GAP: 9 (ref 5–15)
BUN: 17 mg/dL (ref 6–20)
CALCIUM: 9 mg/dL (ref 8.9–10.3)
CO2: 25 mmol/L (ref 22–32)
CREATININE: 0.91 mg/dL (ref 0.44–1.00)
Chloride: 108 mmol/L (ref 101–111)
GFR calc non Af Amer: >60 mL/min (ref >60)
GLUCOSE: 149 mg/dL — AB (ref 65–99)
Potassium: 3.6 mmol/L (ref 3.5–5.1)
Sodium: 142 mmol/L (ref 135–145)

## 2014-10-09 LAB — I-STAT TROPONIN, ED: TROPONIN I, POC: 0.01 ng/mL (ref 0.00–0.08)

## 2014-10-09 LAB — TROPONIN I
Troponin I: <0.03 ng/mL (ref <0.031)
Troponin I: <0.03 ng/mL (ref <0.031)

## 2014-10-09 LAB — BRAIN NATRIURETIC PEPTIDE: B Natriuretic Peptide: 174 pg/mL — ABNORMAL HIGH (ref 0.0–100.0)

## 2014-10-09 LAB — CBC
HCT: 40.7 % (ref 36.0–46.0)
HEMOGLOBIN: 13.9 g/dL (ref 12.0–15.0)
MCH: 31.5 pg (ref 26.0–34.0)
MCHC: 34.2 g/dL (ref 30.0–36.0)
MCV: 92.3 fL (ref 78.0–100.0)
Platelets: 233 10*3/uL (ref 150–400)
RBC: 4.41 MIL/uL (ref 3.87–5.11)
RDW: 12.3 % (ref 11.5–15.5)
WBC: 8 10*3/uL (ref 4.0–10.5)

## 2014-10-09 LAB — TSH: TSH: 1.287 u[IU]/mL (ref 0.350–4.500)

## 2014-10-09 LAB — MRSA PCR SCREENING: MRSA BY PCR: NEGATIVE

## 2014-10-09 MED ORDER — ONDANSETRON HCL 4 MG/2ML IJ SOLN: 4.0000 mg | Freq: Four times a day (QID) | INTRAMUSCULAR | Status: DC | PRN

## 2014-10-09 MED ORDER — FENTANYL 50 MCG/HR TD PT72: 50.0000 ug | MEDICATED_PATCH | TRANSDERMAL | Status: DC

## 2014-10-09 MED ORDER — RIVAROXABAN 20 MG PO TABS
20.0000 mg | ORAL_TABLET | Freq: Every day | ORAL | Status: DC
  Administered 2014-10-09: 20 mg via ORAL
  Filled 2014-10-09: qty 1

## 2014-10-09 MED ORDER — ALPRAZOLAM 0.5 MG PO TABS: 0.5000 mg | ORAL_TABLET | Freq: Three times a day (TID) | ORAL | Status: DC | PRN

## 2014-10-09 MED ORDER — DILTIAZEM LOAD VIA INFUSION
20.0000 mg | INTRAVENOUS | Status: DC | PRN
  Administered 2014-10-09: 20 mg via INTRAVENOUS
  Filled 2014-10-09: qty 20

## 2014-10-09 MED ORDER — HYDROCODONE-ACETAMINOPHEN 5-325 MG PO TABS: 1.0000 | ORAL_TABLET | ORAL | Status: DC | PRN

## 2014-10-09 MED ORDER — ONDANSETRON HCL 4 MG PO TABS: 4.0000 mg | ORAL_TABLET | Freq: Four times a day (QID) | ORAL | Status: DC | PRN

## 2014-10-09 MED ORDER — EZETIMIBE 10 MG PO TABS
10.0000 mg | ORAL_TABLET | Freq: Every morning | ORAL | Status: DC
  Administered 2014-10-10: 10 mg via ORAL
  Filled 2014-10-09: qty 1

## 2014-10-09 MED ORDER — ACETAMINOPHEN 650 MG RE SUPP: 650.0000 mg | Freq: Four times a day (QID) | RECTAL | Status: DC | PRN

## 2014-10-09 MED ORDER — SODIUM CHLORIDE 0.9 % IV SOLN
Freq: Once | INTRAVENOUS | Status: AC
  Administered 2014-10-09: 16:00:00 via INTRAVENOUS

## 2014-10-09 MED ORDER — METOPROLOL TARTRATE 25 MG PO TABS
25.0000 mg | ORAL_TABLET | Freq: Two times a day (BID) | ORAL | Status: DC
  Administered 2014-10-09 – 2014-10-10 (×2): 25 mg via ORAL
  Filled 2014-10-09 (×2): qty 1

## 2014-10-09 MED ORDER — TEMAZEPAM 15 MG PO CAPS
30.0000 mg | ORAL_CAPSULE | Freq: Every evening | ORAL | Status: DC | PRN
  Administered 2014-10-09: 30 mg via ORAL
  Filled 2014-10-09: qty 2

## 2014-10-09 MED ORDER — ALBUTEROL SULFATE (2.5 MG/3ML) 0.083% IN NEBU: 3.0000 mL | INHALATION_SOLUTION | Freq: Four times a day (QID) | RESPIRATORY_TRACT | Status: DC | PRN

## 2014-10-09 MED ORDER — SODIUM CHLORIDE 0.9 % IV BOLUS (SEPSIS)
500.0000 mL | Freq: Once | INTRAVENOUS | Status: AC
  Administered 2014-10-09: 500 mL via INTRAVENOUS

## 2014-10-09 MED ORDER — HYDROMORPHONE HCL 1 MG/ML IJ SOLN: 1.0000 mg | INTRAMUSCULAR | Status: DC | PRN

## 2014-10-09 MED ORDER — DOCUSATE SODIUM 100 MG PO CAPS
100.0000 mg | ORAL_CAPSULE | Freq: Every day | ORAL | Status: DC
  Administered 2014-10-09 – 2014-10-10 (×2): 100 mg via ORAL
  Filled 2014-10-09 (×2): qty 1

## 2014-10-09 MED ORDER — GABAPENTIN 300 MG PO CAPS
300.0000 mg | ORAL_CAPSULE | Freq: Once | ORAL | Status: AC
  Administered 2014-10-09: 300 mg via ORAL
  Filled 2014-10-09: qty 1

## 2014-10-09 MED ORDER — ACETAMINOPHEN 325 MG PO TABS: 650.0000 mg | ORAL_TABLET | Freq: Four times a day (QID) | ORAL | Status: DC | PRN

## 2014-10-09 MED ORDER — DEXTROSE 5 % IV SOLN
5.0000 mg/h | INTRAVENOUS | Status: DC
  Administered 2014-10-09: 5 mg/h via INTRAVENOUS
  Filled 2014-10-09: qty 100

## 2014-10-09 MED ORDER — SODIUM CHLORIDE 0.9 % IV SOLN
INTRAVENOUS | Status: DC
Start: 2014-10-09 — End: 2014-10-10
  Administered 2014-10-09: 18:00:00 via INTRAVENOUS

## 2014-10-09 MED ORDER — ATORVASTATIN CALCIUM 40 MG PO TABS: 80.0000 mg | ORAL_TABLET | Freq: Every day | ORAL | Status: DC

## 2014-10-09 NOTE — H&P (Signed)
History and Physical        Hospital Admission Note Date: 10/09/2014  Patient name: [name] Medical record number: [id number] Date of birth: May 19, 1955 Age: 59 y.o. Gender: female  PCP: Marjorie Smolder, MD  Referring physician: Dr. Tanna Furry  Chief Complaint:  Dizziness with irregular heartbeat  HPI: Patient is a 59 year old female with history of coronary artery disease, MI in 2013 with DESx2 LAD, hypertension, hyperlipidemia presented to ED with dizziness. Patient reported that she was at orthopedic office today when she felt very dizzy. She felt palpitations and irregular heartbeat. She denied any chest pain, nausea, vomiting any diaphoresis or shortness of breath. Patient reported that she has had at least 2 similar episodes and is following Dr. Golden Hurter, was never diagnosed with atrial fibrillation before. Patient had medication adjustments to help with the intermittent dizziness including decreasing the Lasix. She denied having a Holter monitor or event monitor. Patient presented to ED where she was found to have rapid atrial fibrillation with heart rate in 160s. Patient was started on Cardizem drip, hospitalist service was requested for admission.  Troponin 1 negative, BNP 174, CBC and BMET unremarkable, Chest x-ray negative, EKG showed heart rate of 168, rapid atrial fibrillation EDP, Dr Jeneen Rinks has also consulted cardiology  Review of Systems:  Constitutional: Denies fever, chills, diaphoresis, poor appetite and fatigue.  HEENT: Denies photophobia, eye pain, redness, hearing loss, ear pain, congestion, sore throat, rhinorrhea, sneezing, mouth sores, trouble swallowing, neck pain, neck stiffness and tinnitus.   Respiratory: Denies SOB, DOE, cough, chest tightness,  and wheezing.   Cardiovascular: Please see history of present illness  Gastrointestinal: Denies nausea,  vomiting, abdominal pain, diarrhea, constipation, blood in stool and abdominal distention.  Genitourinary: Denies dysuria, urgency, frequency, hematuria, flank pain and difficulty urinating.  Musculoskeletal: Denies myalgias, back pain, joint swelling, arthralgias and gait problem.  Skin: Denies pallor, rash and wound.  Neurological: Denies dizziness, seizures, syncope, weakness, light-headedness, numbness and headaches.  Hematological: Denies adenopathy. Easy bruising, personal or family bleeding history  Psychiatric/Behavioral: Denies suicidal ideation, mood changes, confusion, nervousness, sleep disturbance and agitation  Past Medical History: Past Medical History  Diagnosis Date  . Hypertension   . Hyperlipemia   . Chronic pain     "back and legs"  . Anxiety   . Migraine headache   . Hepatitis     Hepatitis A  . Myocardial infarction 06/29/11  . Pneumonia   . Depression   . Sleep apnea     declined CPAP  . History of weight loss     11-22-13 100 pounds lost over past year ;as intentional weight loss.  . Degenerative joint disease     cervical disc pain wears  Fentanyl Transdermal patch  . CAD (coronary artery disease), native coronary artery     s/p NSTEMI tx with DES x 2 LAD in 2013    Past Surgical History  Procedure Laterality Date  . Abdominal hysterectomy    . Abdominal surgery      removal of benign pelvic tumore  . Tmj arthroplasty    . Replacement total knee    . Joint replacement      bilateral  knee replacements  . Knee surgery      "multiple knee scopes and OR bilaterally; final outcome bilateral knee replacements"  . Tonsillectomy and adenoidectomy    . Coronary angioplasty with stent placement    . Colonoscopy with propofol N/A 11/30/2013    Procedure: COLONOSCOPY WITH PROPOFOL;  Surgeon: Juanita Craver, MD;  Location: [facility];  Service: Endoscopy;  Laterality: N/A;  . Left heart catheterization with coronary angiogram N/A 06/30/2011    Procedure: LEFT  HEART CATHETERIZATION WITH CORONARY ANGIOGRAM;  Surgeon: Jettie Booze, MD;  Location: [facility] CATH LAB;  Service: Cardiovascular;  Laterality: N/A;  . Percutaneous coronary stent intervention (pci-s)  06/30/2011    Procedure: PERCUTANEOUS CORONARY STENT INTERVENTION (PCI-S);  Surgeon: Jettie Booze, MD;  Location: [facility] CATH LAB;  Service: Cardiovascular;;    Medications: Prior to Admission medications   Medication Sig Start Date End Date Taking? Authorizing Provider  ALPRAZolam Duanne Moron) 0.5 MG tablet Take 0.5 mg by mouth 3 (three) times daily as needed for anxiety.  11/25/12  Yes Historical Provider, MD  aspirin 81 MG tablet Take 81 mg by mouth daily.   Yes Historical Provider, MD  atorvastatin (LIPITOR) 80 MG tablet TAKE 1 TABLET (80 MG TOTAL) BY MOUTH DAILY. 02/17/14  Yes Sueanne Margarita, MD  clopidogrel (PLAVIX) 75 MG tablet Take 1 tablet (75 mg total) by mouth daily. 08/21/14  Yes Sueanne Margarita, MD  docusate sodium (COLACE) 100 MG capsule Take 100 mg by mouth daily.   Yes Historical Provider, MD  ezetimibe (ZETIA) 10 MG tablet Take 1 tablet (10 mg total) by mouth every morning. 04/16/14  Yes Jettie Booze, MD  fentaNYL (DURAGESIC - DOSED MCG/HR) 50 MCG/HR Place 1 patch onto the skin every other day.   Yes Historical Provider, MD  furosemide (LASIX) 40 MG tablet Take 40 mg by mouth daily. 08/14/14  Yes Historical Provider, MD  gabapentin (NEURONTIN) 300 MG capsule  02/16/14  Yes Historical Provider, MD  ibuprofen (ADVIL,MOTRIN) 800 MG tablet Take 800 mg by mouth daily.   Yes Historical Provider, MD  isosorbide mononitrate (IMDUR) 60 MG 24 hr tablet Take 1 tablet (60 mg total) by mouth every morning. 01/25/14  Yes Sueanne Margarita, MD  KLOR-CON M20 20 MEQ tablet TAKE 1 TABLET (20 MEQ TOTAL) BY MOUTH 2 (TWO) TIMES DAILY. 05/10/14  Yes Darlin Coco, MD  lisinopril (PRINIVIL,ZESTRIL) 10 MG tablet Take 1 tablet (10 mg total) by mouth daily. 09/17/14  Yes Sueanne Margarita, MD  metoprolol  tartrate (LOPRESSOR) 25 MG tablet Take 25 mg by mouth 2 (two) times daily.   Yes Historical Provider, MD  nitroGLYCERIN (NITROSTAT) 0.4 MG SL tablet Place 1 tablet (0.4 mg total) under the tongue every 5 (five) minutes as needed for chest pain. 11/21/13  Yes Sueanne Margarita, MD  PROAIR HFA 108 (90 BASE) MCG/ACT inhaler Inhale 2 puffs into the lungs as needed. For wheezing 06/28/14  Yes Historical Provider, MD  temazepam (RESTORIL) 30 MG capsule Take 30 mg by mouth at bedtime as needed for sleep.   Yes Historical Provider, MD    Allergies:  No Known Allergies  Social History:  reports that she has quit smoking. Her smoking use included Cigarettes. She started smoking about 8 months ago. She has a .9 pack-year smoking history. She has never used smokeless tobacco. She reports that she drinks alcohol. She reports that she does not use illicit drugs.  Family History: Family History  Problem Relation Age of Onset  .  Cancer Father   . Heart failure Mother   . Heart attack Mother     Physical Exam: Blood pressure 97/53, pulse 80, temperature 98.3 F (36.8 C), temperature source Oral, resp. rate 20, SpO2 96 %. General: Alert, awake, oriented x3, in no acute distress. HEENT: normocephalic, atraumatic, anicteric sclera, pink conjunctiva, pupils equal and reactive to light and accomodation, oropharynx clear Neck: supple, no masses or lymphadenopathy, no goiter, no bruits  Heart: iregularly iregular, tachycardia Lungs: Clear to auscultation bilaterally, no wheezing, rales or rhonchi. Abdomen: Soft, nontender, nondistended, positive bowel sounds, no masses. Extremities: No clubbing, cyanosis or edema with positive pedal pulses. Neuro: Grossly intact, no focal neurological deficits, strength 5/5 upper and lower extremities bilaterally Psych: alert and oriented x 3, normal mood and affect Skin: no rashes or lesions, warm and dry   LABS on Admission:  Basic Metabolic Panel:  Recent Labs Lab  10/09/14 1347  NA 142  K 3.6  CL 108  CO2 25  GLUCOSE 149*  BUN 17  CREATININE 0.91  CALCIUM 9.0   Liver Function Tests: No results for input(s): AST, ALT, ALKPHOS, BILITOT, PROT, ALBUMIN in the last 168 hours. No results for input(s): LIPASE, AMYLASE in the last 168 hours. No results for input(s): AMMONIA in the last 168 hours. CBC:  Recent Labs Lab 10/09/14 1347  WBC 8.0  HGB 13.9  HCT 40.7  MCV 92.3  PLT 233   Cardiac Enzymes:  Recent Labs Lab 10/09/14 1347  TROPONINI <0.03   BNP: Invalid input(s): POCBNP CBG: No results for input(s): GLUCAP in the last 168 hours.  Radiological Exams on Admission:  Dg Chest Port 1 View  10/09/2014   CLINICAL DATA:  Atrial fibrillation, smoker, hypertension, coronary artery disease  EXAM: PORTABLE CHEST - 1 VIEW  COMPARISON:  Portable exam 1407 hours compared to 01/28/2012  FINDINGS: Normal heart size, mediastinal contours, and pulmonary vascularity.  Lungs clear.  No pneumothorax.  Bones unremarkable.  IMPRESSION: No acute abnormalities.   Electronically Signed   By: Lavonia Dana M.D.   On: 10/09/2014 14:14    *I have personally reviewed the images above*  EKG: Independently reviewed. Rate 168, atrial fibrillation with RVR    Assessment/Plan Principal Problem:   Atrial fibrillation with RVR; new onset, no prior history of atrial fibrillation per patient - Admit to stepdown, patient already started on Cardizem drip in the ED, continue IV fluids as systolic BP is in 76H - Obtain serial cardiac enzymes, TSH - Obtain 2-D echocardiogram - Cardiology has been consulted, will follow with conditions regarding the anti-coagulationh, currently on aspirin and Plavix. Patient's prior episodes of dizziness may have been due to undetected paroxysmal atrial fibrillation which places her at high risk of thromboembolic issues/stroke.   Active Problems:   TOBACCO USER - Patient counseled on smoking cessation, placed on nicotine patch     Hypertension -Systolic BP in 60V due to Cardizem drip, hold furosemide, lisinopril, metoprolol  - Continue gentle hydration     Dyslipidemia - Continue Lipitor     Coronary atherosclerosis of native coronary artery: CAD, MI in 2013 with DESx2 LAD, - Continue aspirin and Plavix, statin - Lasix, beta blocker, Imdur, lisinopril currently on hold due to borderline hypotension and rapid A. fib    Near syncope - Likely due to #1, obtain  2-D echo   DVT prophylaxis:  LOVENOX  CODE STATUS:  full code   Family Communication: Admission, patients condition and plan of care including tests being ordered have  been discussed with the patient and family member who indicates understanding and agree with the plan and Code Status  Disposition plan: Further plan will depend as patient's clinical course evolves and further radiologic and laboratory data become available.   Time Spent on Admission: 60 mins   RAI,RIPUDEEP M.D. [facility] 10/09/2014, 3:38 PM Pager: [phone]  If 7PM-7AM, please contact night-coverage [email or web address] Password [email or web address]

## 2014-10-09 NOTE — ED Notes (Signed)
MD in room talking with patient I will collect labs when she finish.

## 2014-10-09 NOTE — ED Provider Notes (Addendum)
CSN: [id number]     Arrival date & time 10/09/14  1328 History   First MD Initiated Contact with Patient 10/09/14 1342     Chief Complaint  Patient presents with  . Dizziness  . Near Syncope     HPI  Vision presents for evaluation of dizziness and near syncope. Several month history of intermittent episodes of dizziness. Has a history of a non-Q-wave MI and follows with Dr. Golden Hurter of nausea. Has had her medications adjusted in response to her intermittent dizziness. Continues to have episodes. Had a similar episode today approximately an hour and a half before her arrival here where she described as feeling of dizziness and feeling of lightheadedness. No chest pain or dyspnea. She is not aware of any tachycardia palpitations. Presents here and found to be in rapid A. fib.  Past Medical History  Diagnosis Date  . Hypertension   . Hyperlipemia   . Chronic pain     "back and legs"  . Anxiety   . Migraine headache   . Hepatitis     Hepatitis A  . Myocardial infarction 06/29/11  . Pneumonia   . Depression   . Sleep apnea     declined CPAP  . History of weight loss     11-22-13 100 pounds lost over past year ;as intentional weight loss.  . Degenerative joint disease     cervical disc pain wears  Fentanyl Transdermal patch  . CAD (coronary artery disease), native coronary artery     s/p NSTEMI tx with DES x 2 LAD in 2013   Past Surgical History  Procedure Laterality Date  . Abdominal hysterectomy    . Abdominal surgery      removal of benign pelvic tumore  . Tmj arthroplasty    . Replacement total knee    . Joint replacement      bilateral knee replacements  . Knee surgery      "multiple knee scopes and OR bilaterally; final outcome bilateral knee replacements"  . Tonsillectomy and adenoidectomy    . Coronary angioplasty with stent placement    . Colonoscopy with propofol N/A 11/30/2013    Procedure: COLONOSCOPY WITH PROPOFOL;  Surgeon: Juanita Craver, MD;  Location: [facility]  ENDOSCOPY;  Service: Endoscopy;  Laterality: N/A;  . Left heart catheterization with coronary angiogram N/A 06/30/2011    Procedure: LEFT HEART CATHETERIZATION WITH CORONARY ANGIOGRAM;  Surgeon: Jettie Booze, MD;  Location: [facility] CATH LAB;  Service: Cardiovascular;  Laterality: N/A;  . Percutaneous coronary stent intervention (pci-s)  06/30/2011    Procedure: PERCUTANEOUS CORONARY STENT INTERVENTION (PCI-S);  Surgeon: Jettie Booze, MD;  Location: [facility] CATH LAB;  Service: Cardiovascular;;   Family History  Problem Relation Age of Onset  . Cancer Father   . Heart failure Mother   . Heart attack Mother    History  Substance Use Topics  . Smoking status: Former Smoker -- 0.03 packs/day for 30 years    Types: Cigarettes    Start date: 01/29/2014  . Smokeless tobacco: Never Used     Comment: smoking consult entered 06/30/11 1503. / 11-22-13 states about 6 cigarettes per day  . Alcohol Use: Yes     Comment: rare   OB History    No data available     Review of Systems  Constitutional: Negative for fever, chills, diaphoresis, appetite change and fatigue.  HENT: Negative for mouth sores, sore throat and trouble swallowing.   Eyes: Negative for visual disturbance.  Respiratory:  Negative for cough, chest tightness, shortness of breath and wheezing.   Cardiovascular: Negative for chest pain.  Gastrointestinal: Negative for nausea, vomiting, abdominal pain, diarrhea and abdominal distention.  Endocrine: Negative for polydipsia, polyphagia and polyuria.  Genitourinary: Negative for dysuria, frequency and hematuria.  Musculoskeletal: Negative for gait problem.  Skin: Negative for color change, pallor and rash.  Neurological: Positive for dizziness and weakness. Negative for syncope, light-headedness and headaches.  Hematological: Does not bruise/bleed easily.  Psychiatric/Behavioral: Negative for behavioral problems and confusion.      Allergies  Review of patient's allergies  indicates no known allergies.  Home Medications   Prior to Admission medications   Medication Sig Start Date End Date Taking? Authorizing Provider  ALPRAZolam Duanne Moron) 0.5 MG tablet Take 0.5 mg by mouth 3 (three) times daily as needed for anxiety.  11/25/12  Yes Historical Provider, MD  aspirin 81 MG tablet Take 81 mg by mouth daily.   Yes Historical Provider, MD  atorvastatin (LIPITOR) 80 MG tablet TAKE 1 TABLET (80 MG TOTAL) BY MOUTH DAILY. 02/17/14  Yes Sueanne Margarita, MD  clopidogrel (PLAVIX) 75 MG tablet Take 1 tablet (75 mg total) by mouth daily. 08/21/14  Yes Sueanne Margarita, MD  docusate sodium (COLACE) 100 MG capsule Take 100 mg by mouth daily.   Yes Historical Provider, MD  ezetimibe (ZETIA) 10 MG tablet Take 1 tablet (10 mg total) by mouth every morning. 04/16/14  Yes Jettie Booze, MD  fentaNYL (DURAGESIC - DOSED MCG/HR) 50 MCG/HR Place 1 patch onto the skin every other day.   Yes Historical Provider, MD  furosemide (LASIX) 40 MG tablet Take 40 mg by mouth daily. 08/14/14  Yes Historical Provider, MD  gabapentin (NEURONTIN) 300 MG capsule  02/16/14  Yes Historical Provider, MD  ibuprofen (ADVIL,MOTRIN) 800 MG tablet Take 800 mg by mouth daily.   Yes Historical Provider, MD  isosorbide mononitrate (IMDUR) 60 MG 24 hr tablet Take 1 tablet (60 mg total) by mouth every morning. 01/25/14  Yes Sueanne Margarita, MD  KLOR-CON M20 20 MEQ tablet TAKE 1 TABLET (20 MEQ TOTAL) BY MOUTH 2 (TWO) TIMES DAILY. 05/10/14  Yes Darlin Coco, MD  lisinopril (PRINIVIL,ZESTRIL) 10 MG tablet Take 1 tablet (10 mg total) by mouth daily. 09/17/14  Yes Sueanne Margarita, MD  metoprolol tartrate (LOPRESSOR) 25 MG tablet Take 25 mg by mouth 2 (two) times daily.   Yes Historical Provider, MD  nitroGLYCERIN (NITROSTAT) 0.4 MG SL tablet Place 1 tablet (0.4 mg total) under the tongue every 5 (five) minutes as needed for chest pain. 11/21/13  Yes Sueanne Margarita, MD  PROAIR HFA 108 (90 BASE) MCG/ACT inhaler Inhale 2 puffs  into the lungs as needed. For wheezing 06/28/14  Yes Historical Provider, MD  temazepam (RESTORIL) 30 MG capsule Take 30 mg by mouth at bedtime as needed for sleep.   Yes Historical Provider, MD   BP 97/53 mmHg  Pulse 80  Temp(Src) 98.3 F (36.8 C) (Oral)  Resp 20  SpO2 96% Physical Exam  Constitutional: She is oriented to person, place, and time. She appears well-developed and well-nourished. No distress.  HENT:  Head: Normocephalic.  Eyes: Conjunctivae are normal. Pupils are equal, round, and reactive to light. No scleral icterus.  Neck: Normal range of motion. Neck supple. No thyromegaly present.  Cardiovascular: An irregularly irregular rhythm present. Tachycardia present.  Exam reveals no gallop and no friction rub.   No murmur heard. A. fib with RVR on the monitor.  Pulmonary/Chest: Effort normal and breath sounds normal. No respiratory distress. She has no wheezes. She has no rales.  Abdominal: Soft. Bowel sounds are normal. She exhibits no distension. There is no tenderness. There is no rebound.  Musculoskeletal: Normal range of motion.  Neurological: She is alert and oriented to person, place, and time.  Skin: Skin is warm and dry. No rash noted.  Psychiatric: She has a normal mood and affect. Her behavior is normal.    ED Course  Procedures (including critical care time) Labs Review Labs Reviewed  BASIC METABOLIC PANEL - Abnormal; Notable for the following:    Glucose, Bld 149 (*)    All other components within normal limits  BRAIN NATRIURETIC PEPTIDE - Abnormal; Notable for the following:    B Natriuretic Peptide 174.0 (*)    All other components within normal limits  CBC  TROPONIN I  I-STAT TROPOININ, ED    Imaging Review Dg Chest Port 1 View  10/09/2014   CLINICAL DATA:  Atrial fibrillation, smoker, hypertension, coronary artery disease  EXAM: PORTABLE CHEST - 1 VIEW  COMPARISON:  Portable exam 1407 hours compared to 01/28/2012  FINDINGS: Normal heart size,  mediastinal contours, and pulmonary vascularity.  Lungs clear.  No pneumothorax.  Bones unremarkable.  IMPRESSION: No acute abnormalities.   Electronically Signed   By: Lavonia Dana M.D.   On: 10/09/2014 14:14     EKG Interpretation   Date/Time:  Tuesday October 09 2014 13:36:46 EDT Ventricular Rate:  168 PR Interval:    QRS Duration: 81 QT Interval:  332 QTC Calculation: 555 R Axis:   85 Text Interpretation:  Atrial fibrillation with rapid V-rate Anteroseptal  infarct, age indeterminate Confirmed by Jeneen Rinks  MD, [place] ([id number]) on  10/09/2014 1:48:21 PM      MDM   Final diagnoses:  Atrial fibrillation     CRITICAL CARE Performed by: Lolita Patella   Total critical care time: 90 min Start:  14:00 Stop:  15:30  Critical care time was exclusive of separately billable procedures and treating other patients.  Critical care was necessary to treat or prevent imminent or life-threatening deterioration.  Critical care was time spent personally by me on the following activities: development of treatment plan with patient and/or surrogate as well as nursing, discussions with consultants, evaluation of patient's response to treatment, examination of patient, obtaining history from patient or surrogate, ordering and performing treatments and interventions, ordering and review of laboratory studies, ordering and review of radiographic studies, pulse oximetry and re-evaluation of patient's condition.  EKG shows A. fib with RVR. Bolused with IV Cardizem and placed on an infusion. Had drop of her pressure to the mid 90s. Glucose initiated. Care discussed with cardiology. Will be seen in consultation. Discussed with Dr. Tana Coast of [facility].  Patient will be admitted.    Tanna Furry, MD 10/09/14 [place] I[place], MD 10/09/14 [phone]

## 2014-10-09 NOTE — Consult Note (Signed)
Reason for Consult:  A fib with RVR   Referring Physician: Dr. Tana Coast   PCP:  Marjorie Smolder, MD  Primary Cardiologist: Dr. Omelia Blackwater is an 59 y.o. female.    Chief Complaint: presented with dizziness and near syncope   HPI: 59 y.o. female with a hx of CAD s/p NSTEMI tx with DES x 2 LAD in 2013, ICM, HTN, HL. EF improved to 50% by nuclear study in 2014 and this study demonstrated no ischemia. She has a history of mild OSA with AHI 11.8 events per hour.It was recommended that she get set up for a CPAP titration but she did not want to proceed. She denies any chest pain or SOB, dizziness, palpitations or syncope. Occasionally she will have some mild LE edema which is stable.   On last OV 08/21/14 she complained of dizziness and her lasix had been decreased and her lisinopril was decreased to 10 mg daily.    Today she presents with increased/intense dizziness. She was in normal health until about 1230 pm and she felt her heart racing and very dizzy, though she would pass out.  No chestpain or SOB.  No nausea. She was found to be in a fib with RVR  Rate of 168.  She rec'd IV dilt drip and 20 mg bolus.   Initial troponins are negative.  BNP is 174.  EKG a fib with RVR at 168 with inf lat depression which may be due to rate.    CHA2DS2VASc score 4.    Past Medical History  Diagnosis Date  . Hypertension   . Hyperlipemia   . Chronic pain     "back and legs"  . Anxiety   . Migraine headache   . Hepatitis     Hepatitis A  . Myocardial infarction 06/29/11  . Pneumonia   . Depression   . Sleep apnea     declined CPAP  . History of weight loss     11-22-13 100 pounds lost over past year ;as intentional weight loss.  . Degenerative joint disease     cervical disc pain wears  Fentanyl Transdermal patch  . CAD (coronary artery disease), native coronary artery     s/p NSTEMI tx with DES x 2 LAD in 2013    Past Surgical History  Procedure Laterality Date    . Abdominal hysterectomy    . Abdominal surgery      removal of benign pelvic tumore  . Tmj arthroplasty    . Replacement total knee    . Joint replacement      bilateral knee replacements  . Knee surgery      "multiple knee scopes and OR bilaterally; final outcome bilateral knee replacements"  . Tonsillectomy and adenoidectomy    . Coronary angioplasty with stent placement    . Colonoscopy with propofol N/A 11/30/2013    Procedure: COLONOSCOPY WITH PROPOFOL;  Surgeon: Juanita Craver, MD;  Location: [facility] ENDOSCOPY;  Service: Endoscopy;  Laterality: N/A;  . Left heart catheterization with coronary angiogram N/A 06/30/2011    Procedure: LEFT HEART CATHETERIZATION WITH CORONARY ANGIOGRAM;  Surgeon: Jettie Booze, MD;  Location: [facility] CATH LAB;  Service: Cardiovascular;  Laterality: N/A;  . Percutaneous coronary stent intervention (pci-s)  06/30/2011    Procedure: PERCUTANEOUS CORONARY STENT INTERVENTION (PCI-S);  Surgeon: Jettie Booze, MD;  Location: [facility] CATH LAB;  Service: Cardiovascular;;    Family History  Problem Relation Age of  Onset  . Cancer Father   . Heart failure Mother   . Heart attack Mother    Social History:  reports that she has quit smoking. Her smoking use included Cigarettes. She started smoking about 8 months ago. She has a .9 pack-year smoking history. She has never used smokeless tobacco. She reports that she drinks alcohol. She reports that she does not use illicit drugs.  Allergies: No Known Allergies  Outpt medications: No current facility-administered medications on file prior to encounter.   Current Outpatient Prescriptions on File Prior to Encounter  Medication Sig Dispense Refill  . ALPRAZolam (XANAX) 0.5 MG tablet Take 0.5 mg by mouth 3 (three) times daily as needed for anxiety.     Marland Kitchen aspirin 81 MG tablet Take 81 mg by mouth daily.    Marland Kitchen atorvastatin (LIPITOR) 80 MG tablet TAKE 1 TABLET (80 MG TOTAL) BY MOUTH DAILY. 30 tablet 3  . clopidogrel (PLAVIX)  75 MG tablet Take 1 tablet (75 mg total) by mouth daily. 90 tablet 3  . docusate sodium (COLACE) 100 MG capsule Take 100 mg by mouth daily.    Marland Kitchen ezetimibe (ZETIA) 10 MG tablet Take 1 tablet (10 mg total) by mouth every morning. 30 tablet 1  . fentaNYL (DURAGESIC - DOSED MCG/HR) 50 MCG/HR Place 1 patch onto the skin every other day.    . furosemide (LASIX) 40 MG tablet Take 40 mg by mouth daily.  0  . gabapentin (NEURONTIN) 300 MG capsule     . ibuprofen (ADVIL,MOTRIN) 800 MG tablet Take 800 mg by mouth daily.    . isosorbide mononitrate (IMDUR) 60 MG 24 hr tablet Take 1 tablet (60 mg total) by mouth every morning. 30 tablet 3  . KLOR-CON M20 20 MEQ tablet TAKE 1 TABLET (20 MEQ TOTAL) BY MOUTH 2 (TWO) TIMES DAILY. 60 tablet 1  . lisinopril (PRINIVIL,ZESTRIL) 10 MG tablet Take 1 tablet (10 mg total) by mouth daily. 90 tablet 2  . metoprolol tartrate (LOPRESSOR) 25 MG tablet Take 25 mg by mouth 2 (two) times daily.    . nitroGLYCERIN (NITROSTAT) 0.4 MG SL tablet Place 1 tablet (0.4 mg total) under the tongue every 5 (five) minutes as needed for chest pain. 25 tablet 3  . PROAIR HFA 108 (90 BASE) MCG/ACT inhaler Inhale 2 puffs into the lungs as needed. For wheezing  0  . temazepam (RESTORIL) 30 MG capsule Take 30 mg by mouth at bedtime as needed for sleep.       Results for orders placed or performed during the hospital encounter of 10/09/14 (from the past 48 hour(s))  CBC     Status: None   Collection Time: 10/09/14  1:47 PM  Result Value Ref Range   WBC 8.0 4.0 - 10.5 K/uL   RBC 4.41 3.87 - 5.11 MIL/uL   Hemoglobin 13.9 12.0 - 15.0 g/dL   HCT 40.7 36.0 - 46.0 %   MCV 92.3 78.0 - 100.0 fL   MCH 31.5 26.0 - 34.0 pg   MCHC 34.2 30.0 - 36.0 g/dL   RDW 12.3 11.5 - 15.5 %   Platelets 233 150 - 400 K/uL  Basic metabolic panel     Status: Abnormal   Collection Time: 10/09/14  1:47 PM  Result Value Ref Range   Sodium 142 135 - 145 mmol/L   Potassium 3.6 3.5 - 5.1 mmol/L   Chloride 108 101 -  111 mmol/L   CO2 25 22 - 32 mmol/L   Glucose, Bld  149 (H) 65 - 99 mg/dL   BUN 17 6 - 20 mg/dL   Creatinine, Ser 0.91 0.44 - 1.00 mg/dL   Calcium 9.0 8.9 - 10.3 mg/dL   GFR calc non Af Amer >60 >60 mL/min   GFR calc Af Amer >60 >60 mL/min    Comment: (NOTE) The eGFR has been calculated using the CKD EPI equation. This calculation has not been validated in all clinical situations. eGFR's persistently <60 mL/min signify possible Chronic Kidney Disease.    Anion gap 9 5 - 15  BNP (order ONLY if patient complains of dyspnea/SOB AND you have documented it for THIS visit)     Status: Abnormal   Collection Time: 10/09/14  1:47 PM  Result Value Ref Range   B Natriuretic Peptide 174.0 (H) 0.0 - 100.0 pg/mL  Troponin I     Status: None   Collection Time: 10/09/14  1:47 PM  Result Value Ref Range   Troponin I <0.03 <0.031 ng/mL    Comment:        NO INDICATION OF MYOCARDIAL INJURY.   I-stat troponin, ED  (not at [facility], [facility])     Status: None   Collection Time: 10/09/14  1:53 PM  Result Value Ref Range   Troponin i, poc 0.01 0.00 - 0.08 ng/mL   Comment 3            Comment: Due to the release kinetics of cTnI, a negative result within the first hours of the onset of symptoms does not rule out myocardial infarction with certainty. If myocardial infarction is still suspected, repeat the test at appropriate intervals.    Dg Chest Port 1 View  10/09/2014   CLINICAL DATA:  Atrial fibrillation, smoker, hypertension, coronary artery disease  EXAM: PORTABLE CHEST - 1 VIEW  COMPARISON:  Portable exam 1407 hours compared to 01/28/2012  FINDINGS: Normal heart size, mediastinal contours, and pulmonary vascularity.  Lungs clear.  No pneumothorax.  Bones unremarkable.  IMPRESSION: No acute abnormalities.   Electronically Signed   By: Lavonia Dana M.D.   On: 10/09/2014 14:14    ROS: General:no colds or fevers, no weight changes (loss of 120 lbs in a year or so)  Skin:no rashes or ulcers HEENT:no  blurred vision, no congestion CV:see HPI PUL:see HPI, + tobacco use GI:no diarrhea constipation or melena, no indigestion GU:no hematuria, no dysuria MS:no joint pain, no claudication Neuro:+ near syncope today, + lightheadedness episodes mostly going form sitting or lying to standing Endo:no diabetes, no thyroid disease   Blood pressure 97/53, pulse 80, temperature 98.3 F (36.8 C), temperature source Oral, resp. rate 20, SpO2 96 %.  Wt Readings from Last 3 Encounters:  08/21/14 162 lb (73.483 kg)  02/20/14 170 lb (77.111 kg)  02/14/14 170 lb (77.111 kg)    PE: General:Pleasant affect, NAD Skin:Warm and dry, brisk capillary refill HEENT:normocephalic, sclera clear, mucus membranes moist Neck:supple, no JVD, no bruits  Heart:S1S2 RRR without murmur, gallup, rub or click Lungs:clear without rales, rhonchi, or wheezes DQQ:IWLN, non tender, + BS, do not palpate liver spleen or masses Ext:no lower ext edema, 2+ pedal pulses, 2+ radial pulses Neuro:alert and oriented X 3, MAE, follows commands, + facial symmetry  Now in SR with PACs    Assessment/Plan Principal Problem:   Atrial fibrillation with RVR on IV dilt, has now converted to SR with PACs, will repeat EKG.  With elevated CHA2DS2VASc score anticoagulation may safe with other episode of PAF. Marland Kitchen Xarelto daily may work well  for her.  Will check on stopping ASA vs. Plavix.  Her stents to LAD were placed 2013.  TSH has been ordered and 2 d echo.  Also serial  Cardiac enzymes.  Keep overnight and if tests stable then d/c in am.    Dr. Johnsie Cancel to address.  Active Problems:  Episodic dizziness: ? paf vs. Hypotension.  No awareness of fast HR with other episodes. Add cardio net at discharge.     TOBACCO USER- we discussed importance of stopping, she can no longer use Nicoderm patches due to whelps, we discussed nicotine gum or cutting back by 1 cigarette every couple of weeks until stopped.     Hypertension- hypotension currently on  dilt drip- my need to increase BB she is on lopressor 25 BID and stop dilt with hx of low EF.  Echo has been ordered.    Dyslipidemia- on statin last lipids were drawn 09/2014 and LDL 59 continue lipitor    Coronary atherosclerosis of native coronary artery- with hx NSTEMI in 2013 with overlapping DES to LAD.  She also had mild disease in the right coronary artery. Very small branch off of the OM1 which is occluded and fills by left to left collaterals    Near syncope- due to rapid HR    ICM with EF 30% in 2013 on cath but last nuc with EF improved to 50% in 2014  OSA is on cpap   [facility]  Nurse Practitioner Certified [place] Pager [phone] or after 5pm or weekends call [phone] 10/09/2014, 3:37 PM  Patient examined chart reviewed.  Much better now in NSR.  Suspect all of her recent dizzyness is related to PAF Continue beta blocker CHADVASC 4 she does not want coumadin and is willing to take NOAC.  Start xarlto tonight.   Can be d/c in am for outpatient echo and f/u Dr Radford Pax if remains in NSR.  Suspect Dr Irish Lack will not be here to round until noon.  Patient anxious to be d/c in am.  Would also arrange outpatient cardionet to verify dizziness from arrhythmia and document if she is having frequent PAF which would necessitate Tikosyn or referral for ablation  [employer]

## 2014-10-09 NOTE — ED Notes (Signed)
Pt states significant cardiac hx and c/o dizziness and near syncope x 1 hr.  Pt states that when she had a heart attack, she did not have the classic MI sx.  States that she had nausea and shoulder blade pain with her MI.  Pt denies these sx today.  States that her "EKGs are always normal and they had to look at her blood work the last time".

## 2014-10-09 NOTE — Progress Notes (Addendum)
ANTICOAGULATION CONSULT NOTE - Initial Consult  Pharmacy Consult for Xarelto Indication: Atrial fibrillation  No Known Allergies  Patient Measurements:    Vital Signs: Temp: 98.3 F (36.8 C) (06/07 1334) Temp Source: Oral (06/07 1334) BP: 92/62 mmHg (06/07 1634) Pulse Rate: 68 (06/07 1634)  Labs:  Recent Labs  10/09/14 1347 10/09/14 1615  HGB 13.9  --   HCT 40.7  --   PLT 233  --   CREATININE 0.91  --   TROPONINI <0.03 <0.03    CrCl cannot be calculated (Unknown ideal weight.).   Medical History: Past Medical History  Diagnosis Date  . Hypertension   . Hyperlipemia   . Chronic pain     "back and legs"  . Anxiety   . Migraine headache   . Hepatitis     Hepatitis A  . Myocardial infarction 06/29/11  . Pneumonia   . Depression   . Sleep apnea     declined CPAP  . History of weight loss     11-22-13 100 pounds lost over past year ;as intentional weight loss.  . Degenerative joint disease     cervical disc pain wears  Fentanyl Transdermal patch  . CAD (coronary artery disease), native coronary artery     s/p NSTEMI tx with DES x 2 LAD in 2013    Medications:  Prescriptions prior to admission  Medication Sig Dispense Refill Last Dose  . ALPRAZolam (XANAX) 0.5 MG tablet Take 0.5 mg by mouth 3 (three) times daily as needed for anxiety.    Past Week at Unknown time  . aspirin 81 MG tablet Take 81 mg by mouth daily.   10/09/2014 at Unknown time  . atorvastatin (LIPITOR) 80 MG tablet TAKE 1 TABLET (80 MG TOTAL) BY MOUTH DAILY. 30 tablet 3 10/09/2014 at Unknown time  . clopidogrel (PLAVIX) 75 MG tablet Take 1 tablet (75 mg total) by mouth daily. 90 tablet 3 10/09/2014 at Unknown time  . docusate sodium (COLACE) 100 MG capsule Take 100 mg by mouth daily.   10/08/2014 at Unknown time  . ezetimibe (ZETIA) 10 MG tablet Take 1 tablet (10 mg total) by mouth every morning. 30 tablet 1 10/09/2014 at Unknown time  . fentaNYL (DURAGESIC - DOSED MCG/HR) 50 MCG/HR Place 1 patch onto the  skin every other day.   10/09/2014 at Unknown time  . furosemide (LASIX) 40 MG tablet Take 40 mg by mouth daily.  0 10/09/2014 at Unknown time  . gabapentin (NEURONTIN) 300 MG capsule    10/08/2014 at Unknown time  . ibuprofen (ADVIL,MOTRIN) 800 MG tablet Take 800 mg by mouth daily.   10/09/2014 at Unknown time  . isosorbide mononitrate (IMDUR) 60 MG 24 hr tablet Take 1 tablet (60 mg total) by mouth every morning. 30 tablet 3 10/09/2014 at Unknown time  . KLOR-CON M20 20 MEQ tablet TAKE 1 TABLET (20 MEQ TOTAL) BY MOUTH 2 (TWO) TIMES DAILY. 60 tablet 1 10/09/2014 at Unknown time  . lisinopril (PRINIVIL,ZESTRIL) 10 MG tablet Take 1 tablet (10 mg total) by mouth daily. 90 tablet 2 10/09/2014 at Unknown time  . metoprolol tartrate (LOPRESSOR) 25 MG tablet Take 25 mg by mouth 2 (two) times daily.   10/09/2014 at 0800  . nitroGLYCERIN (NITROSTAT) 0.4 MG SL tablet Place 1 tablet (0.4 mg total) under the tongue every 5 (five) minutes as needed for chest pain. 25 tablet 3 10/09/2014 at Unknown time  . PROAIR HFA 108 (90 BASE) MCG/ACT inhaler Inhale 2 puffs into  the lungs as needed. For wheezing  0   . temazepam (RESTORIL) 30 MG capsule Take 30 mg by mouth at bedtime as needed for sleep.   10/08/2014 at Unknown time   Scheduled:  . metoprolol tartrate  25 mg Oral BID  . rivaroxaban  20 mg Oral Q supper    Assessment:  59 yo F with history of CAD, MI in 2013 with DESx2 LAD, HTN, HLD, presented to ED with dizziness. Patient reported that she was at orthopedic office today when she felt very dizzy. She felt palpitations and irregular heartbeat. Patient reported that she has had at least 2 similar episodes and is following Dr. Golden Hurter. Patient presented to ED where she was found to have rapid atrial fibrillation with heart rate in 160s  Today, 10/09/2014:  No baseline anticoagulation; however, patient is on ASA/Plavix for stent placed in 2013.  CBC: wnl  SCr wnl; CrCl 75 ml/min by total BW  Major drug interactions:  diltiazem (moderate inhibitor)  No bleeding issues per nursing  Goal of Therapy: Prevention of thromboembolism  Plan:  Xarelto 20 mg PO daily with supper  SCr, CBC at least q72 hr while on warfarin  Monitor for signs of bleeding or thrombosis  Follow cardiology recommendations for stopping ASA/Plavix.     Reuel Boom, PharmD Pager: [phone] 10/09/2014, 5:50 PM

## 2014-10-10 ENCOUNTER — Other Ambulatory Visit (HOSPITAL_COMMUNITY): Payer: Medicare Other

## 2014-10-10 ENCOUNTER — Other Ambulatory Visit: Payer: Self-pay

## 2014-10-10 DIAGNOSIS — I4891 Unspecified atrial fibrillation: Principal | ICD-10-CM

## 2014-10-10 DIAGNOSIS — Z720 Tobacco use: Secondary | ICD-10-CM | POA: Diagnosis not present

## 2014-10-10 DIAGNOSIS — I10 Essential (primary) hypertension: Secondary | ICD-10-CM | POA: Diagnosis not present

## 2014-10-10 DIAGNOSIS — R55 Syncope and collapse: Secondary | ICD-10-CM

## 2014-10-10 LAB — CBC
HCT: 37.6 % (ref 36.0–46.0)
HEMOGLOBIN: 12.5 g/dL (ref 12.0–15.0)
MCH: 31 pg (ref 26.0–34.0)
MCHC: 33.2 g/dL (ref 30.0–36.0)
MCV: 93.3 fL (ref 78.0–100.0)
PLATELETS: 189 10*3/uL (ref 150–400)
RBC: 4.03 MIL/uL (ref 3.87–5.11)
RDW: 12.3 % (ref 11.5–15.5)
WBC: 7.6 10*3/uL (ref 4.0–10.5)

## 2014-10-10 LAB — BASIC METABOLIC PANEL
ANION GAP: 6 (ref 5–15)
BUN: 25 mg/dL — AB (ref 6–20)
CO2: 24 mmol/L (ref 22–32)
Calcium: 8.6 mg/dL — ABNORMAL LOW (ref 8.9–10.3)
Chloride: 113 mmol/L — ABNORMAL HIGH (ref 101–111)
Creatinine, Ser: 0.71 mg/dL (ref 0.44–1.00)
GFR calc Af Amer: >60 mL/min (ref >60)
GFR calc non Af Amer: >60 mL/min (ref >60)
GLUCOSE: 113 mg/dL — AB (ref 65–99)
POTASSIUM: 3.8 mmol/L (ref 3.5–5.1)
Sodium: 143 mmol/L (ref 135–145)

## 2014-10-10 LAB — LIPID PANEL
Cholesterol: 99 mg/dL (ref 0–200)
HDL: 39 mg/dL — ABNORMAL LOW (ref >40)
LDL Cholesterol: 44 mg/dL (ref 0–99)
Total CHOL/HDL Ratio: 2.5 RATIO
Triglycerides: 81 mg/dL (ref <150)
VLDL: 16 mg/dL (ref 0–40)

## 2014-10-10 LAB — TROPONIN I: Troponin I: <0.03 ng/mL (ref <0.031)

## 2014-10-10 MED ORDER — RIVAROXABAN 20 MG PO TABS: 20.0000 mg | ORAL_TABLET | Freq: Every day | ORAL | Status: DC

## 2014-10-10 NOTE — Discharge Summary (Signed)
Physician Discharge Summary  [name] JJK:[id number] DOB: 08-Jun-1955 DOA: 10/09/2014  PCP: Marjorie Smolder, MD  Admit date: 10/09/2014 Discharge date: 10/10/2014  Time spent: > 35 minutes  Recommendations for Outpatient Follow-up:  1. Please decide when to continue patient's lasix, imdur, and lisinopril based on post hospital follow up. Held due to soft blood pressures initially  Discharge Diagnoses:  Principal Problem:   Atrial fibrillation with RVR Active Problems:   TOBACCO USER   Hypertension   Dyslipidemia   Coronary atherosclerosis of native coronary artery   Near syncope   Discharge Condition: stable  Diet recommendation: heart healthy  Filed Weights   10/09/14 1825 10/10/14 0400  Weight: 74 kg (163 lb 2.3 oz) 75.7 kg (166 lb 14.2 oz)    History of present illness:  From original HPI: 59 year old female with history of coronary artery disease, MI in 2013 with DESx2 LAD, hypertension, hyperlipidemia presented to ED with dizziness. Patient reported that she was at orthopedic office today when she felt very dizzy. She felt palpitations and irregular heartbeat. She denied any chest pain, nausea, vomiting any diaphoresis or shortness of breath. Patient reported that she has had at least 2 similar episodes and is following Dr. Golden Hurter, was never diagnosed with atrial fibrillation before.  Hospital Course:  Atrial fibrillation - Seen by cardiology who recommended the following: Now on xarelto for CHA2DS2VASc score 4.  Echo pending but pt would like to go home- ok to d/c and we will arrange outpt cardionet and echo, she will follow up with Dr. Radford Pax. Stop plavix --continue asa 81 mg daily. We will do echo as outpt  - Will provide script for xarelto on d/c  HTN - patient had soft blood pressures on imdur, lasix, and lisinopril - improved once above medications held - Will be d/c'd on B blocker  CAD - native coronary artery with stents to LAD in 2013. plavix  discontinued by Cards. Pt to be discharged on statin and aspirin  Procedures:  None  Consultations:  cardiology  Discharge Exam: Filed Vitals:   10/10/14 0800  BP: 123/82  Pulse: 69  Temp: 97.6 F (36.4 C)  Resp: 13    General: Pt in nad, alert and awake Cardiovascular: s1 and s2 wnl, no rubs Respiratory: cta bl, no wheezes, no rhales  Discharge Instructions   Discharge Instructions    Call MD for:  extreme fatigue    Complete by:  As directed      Call MD for:  persistant dizziness or light-headedness    Complete by:  As directed      Call MD for:  temperature >100.4    Complete by:  As directed      Diet - low sodium heart healthy    Complete by:  As directed      Discharge instructions    Complete by:  As directed   Follow up with Cardiology as per their recommendations based on their last note. Please call their office after discharge to confirm date and time if this information not given by cardiology at day of discharge.  Will defer to you primary care physician or specialist when to continue your lasix.     Increase activity slowly    Complete by:  As directed           Current Discharge Medication List    START taking these medications   Details  rivaroxaban (XARELTO) 20 MG TABS tablet Take 1 tablet (20 mg total) by mouth  daily with supper. Qty: 30 tablet, Refills: 0      CONTINUE these medications which have NOT CHANGED   Details  ALPRAZolam (XANAX) 0.5 MG tablet Take 0.5 mg by mouth 3 (three) times daily as needed for anxiety.     aspirin 81 MG tablet Take 81 mg by mouth daily.    atorvastatin (LIPITOR) 80 MG tablet TAKE 1 TABLET (80 MG TOTAL) BY MOUTH DAILY. Qty: 30 tablet, Refills: 3    docusate sodium (COLACE) 100 MG capsule Take 100 mg by mouth daily.    ezetimibe (ZETIA) 10 MG tablet Take 1 tablet (10 mg total) by mouth every morning. Qty: 30 tablet, Refills: 1    fentaNYL (DURAGESIC - DOSED MCG/HR) 50 MCG/HR Place 1 patch onto the  skin every other day.    gabapentin (NEURONTIN) 300 MG capsule     metoprolol tartrate (LOPRESSOR) 25 MG tablet Take 25 mg by mouth 2 (two) times daily.    nitroGLYCERIN (NITROSTAT) 0.4 MG SL tablet Place 1 tablet (0.4 mg total) under the tongue every 5 (five) minutes as needed for chest pain. Qty: 25 tablet, Refills: 3    PROAIR HFA 108 (90 BASE) MCG/ACT inhaler Inhale 2 puffs into the lungs as needed. For wheezing Refills: 0    temazepam (RESTORIL) 30 MG capsule Take 30 mg by mouth at bedtime as needed for sleep.      STOP taking these medications     clopidogrel (PLAVIX) 75 MG tablet      furosemide (LASIX) 40 MG tablet      ibuprofen (ADVIL,MOTRIN) 800 MG tablet      isosorbide mononitrate (IMDUR) 60 MG 24 hr tablet      KLOR-CON M20 20 MEQ tablet      lisinopril (PRINIVIL,ZESTRIL) 10 MG tablet        No Known Allergies Follow-up Information    Follow up with Sueanne Margarita, MD.   Specialty:  Cardiology   Why:  office will call with date and time   Contact information:   [address] [place] [address] [phone]        The results of significant diagnostics from this hospitalization (including imaging, microbiology, ancillary and laboratory) are listed below for reference.    Significant Diagnostic Studies: Dg Chest Port 1 View  10/09/2014   CLINICAL DATA:  Atrial fibrillation, smoker, hypertension, coronary artery disease  EXAM: PORTABLE CHEST - 1 VIEW  COMPARISON:  Portable exam 1407 hours compared to 01/28/2012  FINDINGS: Normal heart size, mediastinal contours, and pulmonary vascularity.  Lungs clear.  No pneumothorax.  Bones unremarkable.  IMPRESSION: No acute abnormalities.   Electronically Signed   By: Lavonia Dana M.D.   On: 10/09/2014 14:14    Microbiology: Recent Results (from the past 240 hour(s))  MRSA PCR Screening     Status: None   Collection Time: 10/09/14  6:55 PM  Result Value Ref Range Status   MRSA by PCR NEGATIVE  NEGATIVE Final    Comment:        The GeneXpert MRSA Assay (FDA approved for NASAL specimens only), is one component of a comprehensive MRSA colonization surveillance program. It is not intended to diagnose MRSA infection nor to guide or monitor treatment for MRSA infections.      Labs: Basic Metabolic Panel:  Recent Labs Lab 10/09/14 1347 10/10/14 0330  NA 142 143  K 3.6 3.8  CL 108 113*  CO2 25 24  GLUCOSE 149* 113*  BUN 17  25*  CREATININE 0.91 0.71  CALCIUM 9.0 8.6*   Liver Function Tests: No results for input(s): AST, ALT, ALKPHOS, BILITOT, PROT, ALBUMIN in the last 168 hours. No results for input(s): LIPASE, AMYLASE in the last 168 hours. No results for input(s): AMMONIA in the last 168 hours. CBC:  Recent Labs Lab 10/09/14 1347 10/10/14 0330  WBC 8.0 7.6  HGB 13.9 12.5  HCT 40.7 37.6  MCV 92.3 93.3  PLT 233 189   Cardiac Enzymes:  Recent Labs Lab 10/09/14 1347 10/09/14 1615 10/09/14 2145 10/10/14 0330  TROPONINI <0.03 <0.03 <0.03 <0.03   BNP: BNP (last 3 results)  Recent Labs  10/09/14 1347  BNP 174.0*    ProBNP (last 3 results) No results for input(s): PROBNP in the last 8760 hours.  CBG: No results for input(s): GLUCAP in the last 168 hours.     Signed:  Velvet Bathe  [facility] 10/10/2014, 9:29 AM

## 2014-10-10 NOTE — Progress Notes (Signed)
Subjective: No complaints   Objective: Vital signs in last 24 hours: Temp:  [98 F (36.7 C)-98.3 F (36.8 C)] 98 F (36.7 C) (06/08 0400) Pulse Rate:  [58-142] 58 (06/08 0608) Resp:  [1-23] 1 (06/08 0608) BP: (92-116)/(51-81) 110/81 mmHg (06/08 0608) SpO2:  [92 %-100 %] 96 % (06/08 0608) Weight:  [163 lb 2.3 oz (74 kg)-166 lb 14.2 oz (75.7 kg)] 166 lb 14.2 oz (75.7 kg) (06/08 0400) Weight change:  Last BM Date: 10/09/14 Intake/Output from previous day: +1832 06/07 0701 - 06/08 0700 In: 1907.3 [P.O.:966; I.V.:941.3] Out: -  Intake/Output this shift:    PE: General:Pleasant affect, NAD Skin:Warm and dry, brisk capillary refill HEENT:normocephalic, sclera clear, mucus membranes moist Heart:S1S2 RRR without murmur, gallup, rub or click Lungs:clear without rales, rhonchi, or wheezes ZOX:WRUE, non tender, + BS, do not palpate liver spleen or masses Ext:no lower ext edema, 2+ pedal pulses, 2+ radial pulses Neuro:alert and oriented, MAE, follows commands, + facial symmetry Tele: SR    Lab Results:  Recent Labs  10/09/14 1347 10/10/14 0330  WBC 8.0 7.6  HGB 13.9 12.5  HCT 40.7 37.6  PLT 233 189   BMET  Recent Labs  10/09/14 1347 10/10/14 0330  NA 142 143  K 3.6 3.8  CL 108 113*  CO2 25 24  GLUCOSE 149* 113*  BUN 17 25*  CREATININE 0.91 0.71  CALCIUM 9.0 8.6*    Recent Labs  10/09/14 2145 10/10/14 0330  TROPONINI <0.03 <0.03    Lab Results  Component Value Date   CHOL 109 09/05/2014   HDL 39.60 09/05/2014   LDLCALC 59 09/05/2014   TRIG 51.0 09/05/2014   CHOLHDL 3 09/05/2014   Lab Results  Component Value Date   HGBA1C 6.0* 06/30/2011     Lab Results  Component Value Date   TSH 1.287 10/09/2014      Studies/Results: Dg Chest Port 1 View  10/09/2014   CLINICAL DATA:  Atrial fibrillation, smoker, hypertension, coronary artery disease  EXAM: PORTABLE CHEST - 1 VIEW  COMPARISON:  Portable exam 1407 hours compared to 01/28/2012   FINDINGS: Normal heart size, mediastinal contours, and pulmonary vascularity.  Lungs clear.  No pneumothorax.  Bones unremarkable.  IMPRESSION: No acute abnormalities.   Electronically Signed   By: Lavonia Dana M.D.   On: 10/09/2014 14:14    Medications: I have reviewed the patient's current medications. Scheduled Meds: . atorvastatin  80 mg Oral q1800  . docusate sodium  100 mg Oral Daily  . ezetimibe  10 mg Oral q morning - 10a  . [START ON 10/11/2014] fentaNYL  50 mcg Transdermal Q48H  . metoprolol tartrate  25 mg Oral BID  . rivaroxaban  20 mg Oral Q supper   Continuous Infusions: . sodium chloride 75 mL/hr at 10/09/14 1827   PRN Meds:.acetaminophen **OR** acetaminophen, albuterol, ALPRAZolam, HYDROcodone-acetaminophen, HYDROmorphone (DILAUDID) injection, ondansetron **OR** ondansetron (ZOFRAN) IV, temazepam  Assessment/Plan: Principal Problem:   Atrial fibrillation with RVR- resolved with Cardizem drip, now on home lopressor, have held lasix and lisinopril with hypotension.   troponins negative.  Now on xarelto for CHA2DS2VASc score 4.   Echo pending but pt would like to go home- ok to d/c and we will arrange outpt cardionet and echo, she will follow up with Dr. Radford Pax.  Stop plavix --continue asa 81 mg daily.  We will do echo as outpt    Active Problems:   TOBACCO USER we discussed importance of  stopping tobacco, she continues to decrease    Hypertension- more hypotensive may need to hold lisinopril until follow up visit   Dyslipidemia- continue lipitor   Coronary atherosclerosis of native coronary artery with stents to LAD in 2013.  Neg troponn no chest pain, stents placed 2013 will stop plavix.   Near syncope due to rapid a fib.     LOS: 1 day   Time spent with pt. :15 minutes. [facility]  Nurse Practitioner Certified Pager [phone] or after 5pm and on weekends call [phone] 10/10/2014, 8:07 AM   Patient left hospital before I saw her.  Cardiology f/u planned.  Jettie Booze, MD

## 2014-10-10 NOTE — Discharge Instructions (Signed)
The office will call for outpatient monitor to be applied.  This will detect silent atrial fib.  We will do an echocardiogram in the office as well.  Continue Heart Healthy diet.    Continue CPAP  Call or come to emergency room for rapid Heart rate.

## 2014-10-10 NOTE — Progress Notes (Signed)
Patient discharged home per order.

## 2014-10-11 LAB — URINE CULTURE

## 2014-10-15 ENCOUNTER — Other Ambulatory Visit: Payer: Self-pay | Admitting: Cardiology

## 2014-10-19 ENCOUNTER — Ambulatory Visit (HOSPITAL_COMMUNITY): Payer: Medicare Other | Attending: Cardiovascular Disease

## 2014-10-19 ENCOUNTER — Other Ambulatory Visit: Payer: Self-pay

## 2014-10-19 ENCOUNTER — Ambulatory Visit (INDEPENDENT_AMBULATORY_CARE_PROVIDER_SITE_OTHER): Payer: Medicare Other

## 2014-10-19 ENCOUNTER — Telehealth: Payer: Self-pay

## 2014-10-19 DIAGNOSIS — I519 Heart disease, unspecified: Secondary | ICD-10-CM | POA: Diagnosis not present

## 2014-10-19 DIAGNOSIS — R55 Syncope and collapse: Secondary | ICD-10-CM | POA: Diagnosis not present

## 2014-10-19 DIAGNOSIS — I2510 Atherosclerotic heart disease of native coronary artery without angina pectoris: Secondary | ICD-10-CM

## 2014-10-19 DIAGNOSIS — I4891 Unspecified atrial fibrillation: Secondary | ICD-10-CM | POA: Insufficient documentation

## 2014-10-19 DIAGNOSIS — I480 Paroxysmal atrial fibrillation: Secondary | ICD-10-CM

## 2014-10-19 NOTE — Telephone Encounter (Signed)
patient called could not pay for zetia and xarelto called her insurance company to see why her meds were 300.00 and her copay for 40.00  For each meds. Then I called [facility] to check cost of meds and they told me that it was 300.00 and thinks she was in the doughnut hole. So she filled out patient assistant forms and I will be faxing and mailing one on Monday 6.19.16

## 2014-10-29 ENCOUNTER — Telehealth: Payer: Self-pay

## 2014-10-29 NOTE — Telephone Encounter (Signed)
Faxed over papers for patient assistance to [employer] for [employer] and mailed paper work for zetia to [employer]

## 2014-11-13 ENCOUNTER — Other Ambulatory Visit: Payer: Self-pay | Admitting: Cardiology

## 2014-11-16 ENCOUNTER — Other Ambulatory Visit: Payer: Self-pay | Admitting: Cardiology

## 2014-11-19 ENCOUNTER — Telehealth: Payer: Self-pay | Admitting: *Deleted

## 2014-11-19 ENCOUNTER — Other Ambulatory Visit: Payer: Self-pay | Admitting: *Deleted

## 2014-11-19 ENCOUNTER — Other Ambulatory Visit: Payer: Self-pay | Admitting: Cardiology

## 2014-11-19 MED ORDER — EZETIMIBE 10 MG PO TABS: 10.0000 mg | ORAL_TABLET | Freq: Every morning | ORAL | Status: DC

## 2014-11-19 NOTE — Telephone Encounter (Signed)
Patient called and the pharmacy requested a refill on the klor con. Per hospital discharge note 10/10/14 this medication was stopped. I will call the pharmacy and cancel the order.

## 2014-11-21 ENCOUNTER — Other Ambulatory Visit: Payer: Self-pay

## 2014-11-21 ENCOUNTER — Other Ambulatory Visit: Payer: Self-pay | Admitting: Cardiology

## 2014-11-22 NOTE — Telephone Encounter (Signed)
Hi [name], Could you refill these meds for this patient please? I went to refill and none of them were listed under her med list in snapshot so that I could refill.

## 2014-11-26 ENCOUNTER — Telehealth: Payer: Self-pay | Admitting: Cardiology

## 2014-11-26 NOTE — Telephone Encounter (Signed)
Follow up      Patient was told to call back with blood pressure reading  111/71 in left arm.

## 2014-11-26 NOTE — Telephone Encounter (Signed)
Her Lasix and lisinopril were held due to hypotension - what is her BP at home?

## 2014-11-26 NOTE — Telephone Encounter (Signed)
Patient is concerned because when she was discharged from the hospital in June, her potassium, lasix, lisinopril, and Imdur was discontinued and her Plavix was changed to Xarelto.  Patient st her RLE is starting to swell and she wants to restart her Lasix. She reports no pain - she st Dr. Radford Pax started her on the lasix to begin with because of RLE swelling. She also cannot afford Xarelto - she wants to know if there is another medication to try.  Patient has no recent VS to report. She is asymptomatic other then swelling.  To Dr. Radford Pax for recommendations.

## 2014-11-26 NOTE — Telephone Encounter (Signed)
Patient has no recent BP readings. She agrees to [facility] later today and check and call back with result.

## 2014-11-26 NOTE — Telephone Encounter (Signed)
New message     Pt returning your call. Please call to discuss.

## 2014-11-27 ENCOUNTER — Other Ambulatory Visit: Payer: Self-pay

## 2014-11-27 MED ORDER — POTASSIUM CHLORIDE ER 20 MEQ PO TBCR: 20.0000 meq | EXTENDED_RELEASE_TABLET | Freq: Two times a day (BID) | ORAL | Status: DC

## 2014-11-27 MED ORDER — FUROSEMIDE 40 MG PO TABS: 40.0000 mg | ORAL_TABLET | Freq: Every day | ORAL | Status: DC

## 2014-11-27 NOTE — Telephone Encounter (Signed)
Any recommendations?[name]

## 2014-11-27 NOTE — Telephone Encounter (Signed)
Instructed patient to RESTART LASIX 40 mg daily and KDUR 20 meq BID.  Patient st she cannot afford Zetia or Xarelto. She requests Dr. Radford Pax choose alternative medications.

## 2014-11-27 NOTE — Telephone Encounter (Signed)
Follow up ° ° °Pt returning your call °

## 2014-11-27 NOTE — Telephone Encounter (Signed)
Per Dr. Radford Pax, patient may resume Lasix and K+ at previous dose. Lasix 40 mg daily Kdur 20 meq BID  Left message to call back.

## 2014-11-28 MED ORDER — WARFARIN SODIUM 5 MG PO TABS: 5.0000 mg | ORAL_TABLET | Freq: Every day | ORAL | Status: DC

## 2014-11-28 NOTE — Telephone Encounter (Signed)
Pt has Medicare Part D so unfortunately since she does not qualify for pt assistance through manufacturer, no other options available.  Will have to change from Xarelto to Coumadin.  Would have her overlap Coumadin 5mg  daily and Xarelto x 3 days then continue Coumadin alone.  Set up for a new Coumadin appt 5-7 days after starting Coumadin.   Her LDL is 44 so okay to stop Zetia and continue on Lipitor alone.

## 2014-11-28 NOTE — Telephone Encounter (Signed)
Instructed patient to [place]. Informed patient that her only other option if she does not want to continue the Xarelto is to switch to Coumadin. Instructed patient that one week of Coumadin 5 mg will be called in. She is to overlap Coumadin 5 mg and Xarelto for three days and then STOP Xarelto.  Patient requests to call when she starts the Coumadin so her Coumadin Clinic can be scheduled.

## 2014-11-29 NOTE — Telephone Encounter (Signed)
Reviewed instructions for changing patient from Xarelto to Coumadin. Patient has some Xarelto, so she will START COUMADIN 8/19 and STOP XARELTO after 8/21 dose. New Coumadin Clinic patient appointment scheduled for 8/26 per patient request. Patient agrees with treatment plan.

## 2014-12-06 ENCOUNTER — Encounter: Payer: Self-pay | Admitting: Cardiology

## 2014-12-10 ENCOUNTER — Telehealth: Payer: Self-pay | Admitting: Cardiology

## 2014-12-10 NOTE — Telephone Encounter (Signed)
New message     Pt has some questions regarding medications Please call to discuss

## 2014-12-10 NOTE — Telephone Encounter (Signed)
When did she stop the Zetia

## 2014-12-10 NOTE — Telephone Encounter (Signed)
Patient called, stating the apparently the Zetia patient assistance program followed through because she received a 90 day supply with refills recently.  Last LDL in June was 44. Patient has not been taking Zetia for a couple weeks due to cost (see 7/25 phone note). Because she now has the medication, she wants to know if she should resume taking the Zetia.  To Dr. Radford Pax and Gay Filler, PharmD, for recommendations.

## 2014-12-11 MED ORDER — EZETIMIBE 10 MG PO TABS: 10.0000 mg | ORAL_TABLET | Freq: Every day | ORAL | Status: DC

## 2014-12-11 NOTE — Telephone Encounter (Signed)
Follow up ° ° °Pt returning your call °

## 2014-12-11 NOTE — Telephone Encounter (Signed)
Left message to call back  

## 2014-12-11 NOTE — Telephone Encounter (Signed)
Instructed patient to RESTART ZETIA. Patient agrees with treatment plan and med list updated.

## 2014-12-11 NOTE — Telephone Encounter (Signed)
Given her history of ASCVD, I would continue the Zetia since she is able to get it for free.  We now have outcomes data that showed combination of Zetia/statin was better than just a statin alone for preventing future events.

## 2014-12-28 ENCOUNTER — Ambulatory Visit (INDEPENDENT_AMBULATORY_CARE_PROVIDER_SITE_OTHER): Payer: Medicare Other | Admitting: *Deleted

## 2014-12-28 ENCOUNTER — Other Ambulatory Visit: Payer: Self-pay | Admitting: Cardiology

## 2014-12-28 DIAGNOSIS — I4891 Unspecified atrial fibrillation: Secondary | ICD-10-CM

## 2014-12-28 LAB — POCT INR: INR: 1.5

## 2014-12-28 MED ORDER — WARFARIN SODIUM 5 MG PO TABS: 5.0000 mg | ORAL_TABLET | ORAL | Status: DC

## 2014-12-28 NOTE — Patient Instructions (Signed)

## 2015-01-04 ENCOUNTER — Ambulatory Visit (INDEPENDENT_AMBULATORY_CARE_PROVIDER_SITE_OTHER): Payer: Medicare Other | Admitting: *Deleted

## 2015-01-04 DIAGNOSIS — I4891 Unspecified atrial fibrillation: Secondary | ICD-10-CM

## 2015-01-04 LAB — POCT INR: INR: 2

## 2015-01-11 ENCOUNTER — Ambulatory Visit (INDEPENDENT_AMBULATORY_CARE_PROVIDER_SITE_OTHER): Payer: Medicare Other | Admitting: *Deleted

## 2015-01-11 DIAGNOSIS — I4891 Unspecified atrial fibrillation: Secondary | ICD-10-CM

## 2015-01-11 LAB — POCT INR: INR: 1.7

## 2015-01-18 ENCOUNTER — Ambulatory Visit (INDEPENDENT_AMBULATORY_CARE_PROVIDER_SITE_OTHER): Payer: Medicare Other | Admitting: Pharmacist

## 2015-01-18 DIAGNOSIS — I4891 Unspecified atrial fibrillation: Secondary | ICD-10-CM

## 2015-01-18 LAB — POCT INR: INR: 1.5

## 2015-01-25 ENCOUNTER — Ambulatory Visit (INDEPENDENT_AMBULATORY_CARE_PROVIDER_SITE_OTHER): Payer: Medicare Other | Admitting: Pharmacist

## 2015-01-25 DIAGNOSIS — I4891 Unspecified atrial fibrillation: Secondary | ICD-10-CM | POA: Diagnosis not present

## 2015-01-25 LAB — POCT INR: INR: 1.8

## 2015-01-25 MED ORDER — WARFARIN SODIUM 5 MG PO TABS: 5.0000 mg | ORAL_TABLET | ORAL | Status: DC

## 2015-02-11 ENCOUNTER — Ambulatory Visit (INDEPENDENT_AMBULATORY_CARE_PROVIDER_SITE_OTHER): Payer: Medicare Other | Admitting: *Deleted

## 2015-02-11 DIAGNOSIS — I4891 Unspecified atrial fibrillation: Secondary | ICD-10-CM | POA: Diagnosis not present

## 2015-02-11 LAB — POCT INR: INR: 1.8

## 2015-02-19 ENCOUNTER — Ambulatory Visit (INDEPENDENT_AMBULATORY_CARE_PROVIDER_SITE_OTHER): Payer: Medicare Other | Admitting: Cardiology

## 2015-02-19 ENCOUNTER — Ambulatory Visit (INDEPENDENT_AMBULATORY_CARE_PROVIDER_SITE_OTHER): Payer: Medicare Other | Admitting: Pharmacist

## 2015-02-19 ENCOUNTER — Encounter: Payer: Self-pay | Admitting: Cardiology

## 2015-02-19 VITALS — BP 122/62 | HR 65 | Ht 67.0 in | Wt 154.0 lb

## 2015-02-19 DIAGNOSIS — I2510 Atherosclerotic heart disease of native coronary artery without angina pectoris: Secondary | ICD-10-CM

## 2015-02-19 DIAGNOSIS — I480 Paroxysmal atrial fibrillation: Secondary | ICD-10-CM

## 2015-02-19 DIAGNOSIS — I4819 Other persistent atrial fibrillation: Secondary | ICD-10-CM | POA: Insufficient documentation

## 2015-02-19 DIAGNOSIS — I10 Essential (primary) hypertension: Secondary | ICD-10-CM | POA: Diagnosis not present

## 2015-02-19 DIAGNOSIS — I255 Ischemic cardiomyopathy: Secondary | ICD-10-CM

## 2015-02-19 DIAGNOSIS — I4891 Unspecified atrial fibrillation: Secondary | ICD-10-CM | POA: Diagnosis not present

## 2015-02-19 DIAGNOSIS — E785 Hyperlipidemia, unspecified: Secondary | ICD-10-CM

## 2015-02-19 DIAGNOSIS — G4733 Obstructive sleep apnea (adult) (pediatric): Secondary | ICD-10-CM | POA: Diagnosis not present

## 2015-02-19 HISTORY — DX: Other persistent atrial fibrillation: I48.19

## 2015-02-19 LAB — POCT INR: INR: 1.9

## 2015-02-19 MED ORDER — APIXABAN 5 MG PO TABS: 5.0000 mg | ORAL_TABLET | Freq: Two times a day (BID) | ORAL | Status: DC

## 2015-02-19 NOTE — Progress Notes (Addendum)
Cardiology Office Note   Date:  02/19/2015   ID:  Kaileia, Flow 12-24-1955, MRN [id number]  PCP:  Marjorie Smolder, MD    Chief Complaint  Patient presents with  . Atrial Fibrillation  . Coronary Artery Disease  . Hypertension      History of Present Illness: [name] is a 59 y.o. female with a hx of CAD s/p NSTEMI tx with DES x 2 LAD in 2013, ICM, HTN, HL. EF improved to 50% by nuclear study in 2014 and this study demonstrated no ischemia. She has a history of mild OSA with AHI 11.8 events per hour.It was recommended that she get set up for a CPAP titration but she did not want to proceed.She recently was hospitalized for atrial fibrillation with RVR and was started on NOAC for a CHADS2VASC score of 4 but changed to Coumadin due to cost.   She denies any chest pain or SOB, dizziness, palpitations or syncope. Occasionally she will have some mild LE edema which is stable.   Past Medical History  Diagnosis Date  . Hypertension   . Hyperlipemia   . Chronic pain     "back and legs"  . Anxiety   . Migraine headache   . Hepatitis     Hepatitis A  . Myocardial infarction ([place]) 06/29/11  . Pneumonia   . Depression   . Sleep apnea     declined CPAP  . History of weight loss     11-22-13 100 pounds lost over past year ;as intentional weight loss.  . Degenerative joint disease     cervical disc pain wears  Fentanyl Transdermal patch  . CAD (coronary artery disease), native coronary artery     s/p NSTEMI tx with DES x 2 LAD in 2013  . PAF (paroxysmal atrial fibrillation) ([place]) 02/19/2015    Past Surgical History  Procedure Laterality Date  . Abdominal hysterectomy    . Abdominal surgery      removal of benign pelvic tumore  . Tmj arthroplasty    . Replacement total knee    . Joint replacement      bilateral knee replacements  . Knee surgery      "multiple knee scopes and OR bilaterally; final outcome bilateral knee replacements"  .  Tonsillectomy and adenoidectomy    . Coronary angioplasty with stent placement    . Colonoscopy with propofol N/A 11/30/2013    Procedure: COLONOSCOPY WITH PROPOFOL;  Surgeon: Juanita Craver, MD;  Location: [facility];  Service: Endoscopy;  Laterality: N/A;  . Left heart catheterization with coronary angiogram N/A 06/30/2011    Procedure: LEFT HEART CATHETERIZATION WITH CORONARY ANGIOGRAM;  Surgeon: Jettie Booze, MD;  Location: [facility] CATH LAB;  Service: Cardiovascular;  Laterality: N/A;  . Percutaneous coronary stent intervention (pci-s)  06/30/2011    Procedure: PERCUTANEOUS CORONARY STENT INTERVENTION (PCI-S);  Surgeon: Jettie Booze, MD;  Location: [facility] CATH LAB;  Service: Cardiovascular;;     Current Outpatient Prescriptions  Medication Sig Dispense Refill  . ALPRAZolam (XANAX) 0.5 MG tablet Take 0.5 mg by mouth 3 (three) times daily as needed for anxiety.     Marland Kitchen aspirin 81 MG tablet Take 81 mg by mouth daily.    Marland Kitchen atorvastatin (LIPITOR) 80 MG tablet TAKE 1 TABLET (80 MG TOTAL) BY MOUTH DAILY. 30 tablet 3  . docusate sodium (COLACE) 100 MG capsule Take 100 mg  by mouth daily.    Marland Kitchen ezetimibe (ZETIA) 10 MG tablet Take 1 tablet (10 mg total) by mouth daily. 90 tablet 3  . furosemide (LASIX) 40 MG tablet Take 1 tablet (40 mg total) by mouth daily. 30 tablet 11  . gabapentin (NEURONTIN) 300 MG capsule Take 300 mg by mouth daily.     . metoprolol tartrate (LOPRESSOR) 25 MG tablet TAKE 1 TABLET BY MOUTH TWICE A DAY 180 tablet 1  . morphine (MSIR) 15 MG tablet Take 15 mg by mouth 2 (two) times daily.    . nitroGLYCERIN (NITROSTAT) 0.4 MG SL tablet Place 1 tablet (0.4 mg total) under the tongue every 5 (five) minutes as needed for chest pain. 25 tablet 3  . potassium chloride 20 MEQ TBCR Take 20 mEq by mouth 2 (two) times daily. 60 tablet 11  . PROAIR HFA 108 (90 BASE) MCG/ACT inhaler Inhale 2 puffs into the lungs as needed. For wheezing  0  . temazepam (RESTORIL) 30 MG capsule Take 30 mg by  mouth at bedtime as needed for sleep.    Marland Kitchen warfarin (COUMADIN) 5 MG tablet Take 1 tablet (5 mg total) by mouth as directed. 50 tablet 3   No current facility-administered medications for this visit.    Allergies:   Review of patient's allergies indicates no known allergies.    Social History:  The patient  reports that she has quit smoking. Her smoking use included Cigarettes. She started smoking about 12 months ago. She has a .9 pack-year smoking history. She has never used smokeless tobacco. She reports that she drinks alcohol. She reports that she does not use illicit drugs.   Family History:  The patient's family history includes Cancer in her father; Heart attack in her mother; Heart failure in her mother.    ROS:  Please see the history of present illness.   Otherwise, review of systems are positive for none.   All other systems are reviewed and negative.    PHYSICAL EXAM: VS:  BP 122/62 mmHg  Pulse 65  Ht 5\' 7"  (1.702 m)  Wt 154 lb (69.854 kg)  BMI 24.11 kg/m2  SpO2 96% , BMI Body mass index is 24.11 kg/(m^2). GEN: Well nourished, well developed, in no acute distress HEENT: normal Neck: no JVD, carotid bruits, or masses Cardiac: RRR; no murmurs, rubs, or gallops,no edema  Respiratory:  clear to auscultation bilaterally, normal work of breathing GI: soft, nontender, nondistended, + BS MS: no deformity or atrophy Skin: warm and dry, no rash Neuro:  Strength and sensation are intact Psych: euthymic mood, full affect   EKG:  EKG is not ordered today.    Recent Labs: 09/05/2014: ALT 27 10/09/2014: B Natriuretic Peptide 174.0*; TSH 1.287 10/10/2014: BUN 25*; Creatinine, Ser 0.71; Hemoglobin 12.5; Platelets 189; Potassium 3.8; Sodium 143    Lipid Panel    Component Value Date/Time   CHOL 99 10/10/2014 0340   TRIG 81 10/10/2014 0340   HDL 39* 10/10/2014 0340   CHOLHDL 2.5 10/10/2014 0340   VLDL 16 10/10/2014 0340   LDLCALC 44 10/10/2014 0340      Wt Readings from  Last 3 Encounters:  02/19/15 154 lb (69.854 kg)  10/10/14 166 lb 14.2 oz (75.7 kg)  08/21/14 162 lb (73.483 kg)    ASSESSMENT AND PLAN:  1. Atherosclerosis of native coronary artery of native heart without angina pectoris: No angina. Continue statin, ASA,nitrates, beta blocker.  2. Cardiomyopathy, ischemic: Continue nitrates, ACEI, beta blocker, Lasix - check  BMET 3.  Essential hypertension: Controlled.  4. Dyslipidemia: Continue statin. LDL at goal at 44.  5. TOBACCO USER: She uses the e cigarretes now  6. Mild OSA - her recent sleep study showed an AHI of 11/hr mainly in the supine position. She declined Engineer, maintenance (IT). She is wearing a mouth guard.        7.  PAF maintaining NSR on BB and warfarin.  She is very frustrated that her INR has not been therapeutic. She would like to try Eliquis.  She will be seen in coumadin clinic and Eliquis 5mg  BID started per their recommendations on timing.       Current medicines are reviewed at length with the patient today.  The patient does not have concerns regarding medicines.  The following changes have been made:  no change  Labs/ tests ordered today: See above Assessment and Plan No orders of the defined types were placed in this encounter.     Disposition:   FU with me in 6 months   Signed, Sueanne Margarita, MD  02/19/2015 3:43 PM    [facility] [address], [place], [place]  [address] Phone: [phone]; Fax: [phone]

## 2015-02-19 NOTE — Patient Instructions (Addendum)
Medication Instructions:  Your physician has recommended you make the following change in your medication:  1) START ELIQUIS 5 mg twice daily  Labwork: None  Testing/Procedures: None  Follow-Up: Your physician wants you to follow-up in: 6 months with Dr. Radford Pax. You will receive a reminder letter in the mail two months in advance. If you don't receive a letter, please call our office to schedule the follow-up appointment.   Any Other Special Instructions Will Be Listed Below (If Applicable).

## 2015-02-20 ENCOUNTER — Encounter: Payer: Self-pay | Admitting: Cardiology

## 2015-02-20 ENCOUNTER — Telehealth: Payer: Self-pay | Admitting: Cardiology

## 2015-02-20 NOTE — Telephone Encounter (Signed)
New Message  Pt called would like a call back to determine if you are changing the dosage. She is planning to stay on the coumadin. Unable to take the medication that was offered because it was too high. Pt

## 2015-02-22 NOTE — Telephone Encounter (Signed)
Eliquis is too expensive, so patient will stay on Coumadin. She requests Coumadin instructions for this week and a FU Coumadin Clinic appointment. Informed patient Coumadin Clinic will call her with instruction. Patient agrees with treatment plan.

## 2015-02-22 NOTE — Telephone Encounter (Signed)
Returned a call to the patient and she stated she has never started taking Eliquis due to the cost and has remained on Coumadin on her normal doseage. Changed her Coumadin dose due to INR levels over the phone to Coumadin 10mg  (2 tablets) daily except 7.5mg  (1.5 tablets) on Sundays, Tuesdays,  and Thursdays; she verbalized understanding and appointment set over the phone, too.

## 2015-02-22 NOTE — Telephone Encounter (Signed)
Follow Up  Pt is calling back again to determine when she is supposed to come and the dosages she is supposed.

## 2015-03-08 ENCOUNTER — Ambulatory Visit (INDEPENDENT_AMBULATORY_CARE_PROVIDER_SITE_OTHER): Payer: Medicare Other | Admitting: *Deleted

## 2015-03-08 DIAGNOSIS — Z5181 Encounter for therapeutic drug level monitoring: Secondary | ICD-10-CM | POA: Diagnosis not present

## 2015-03-08 DIAGNOSIS — I4891 Unspecified atrial fibrillation: Secondary | ICD-10-CM | POA: Diagnosis not present

## 2015-03-08 LAB — POCT INR: INR: 2.1

## 2015-04-08 ENCOUNTER — Ambulatory Visit (INDEPENDENT_AMBULATORY_CARE_PROVIDER_SITE_OTHER): Payer: Medicare Other | Admitting: *Deleted

## 2015-04-08 DIAGNOSIS — Z5181 Encounter for therapeutic drug level monitoring: Secondary | ICD-10-CM | POA: Diagnosis not present

## 2015-04-08 LAB — POCT INR: INR: 2.4

## 2015-04-17 ENCOUNTER — Other Ambulatory Visit: Payer: Self-pay | Admitting: *Deleted

## 2015-04-17 MED ORDER — METOPROLOL TARTRATE 25 MG PO TABS: 25.0000 mg | ORAL_TABLET | Freq: Two times a day (BID) | ORAL | Status: DC

## 2015-05-08 DIAGNOSIS — L570 Actinic keratosis: Secondary | ICD-10-CM | POA: Diagnosis not present

## 2015-05-14 ENCOUNTER — Other Ambulatory Visit: Payer: Self-pay | Admitting: Cardiology

## 2015-05-21 ENCOUNTER — Other Ambulatory Visit: Payer: Self-pay | Admitting: *Deleted

## 2015-05-21 MED ORDER — ATORVASTATIN CALCIUM 80 MG PO TABS: ORAL_TABLET | ORAL | Status: DC

## 2015-05-23 ENCOUNTER — Ambulatory Visit (INDEPENDENT_AMBULATORY_CARE_PROVIDER_SITE_OTHER): Payer: Medicare HMO | Admitting: *Deleted

## 2015-05-23 DIAGNOSIS — Z5181 Encounter for therapeutic drug level monitoring: Secondary | ICD-10-CM

## 2015-05-23 DIAGNOSIS — I4891 Unspecified atrial fibrillation: Secondary | ICD-10-CM | POA: Diagnosis not present

## 2015-05-23 LAB — POCT INR: INR: 2.2

## 2015-05-30 DIAGNOSIS — D1800 Hemangioma unspecified site: Secondary | ICD-10-CM | POA: Diagnosis not present

## 2015-05-30 DIAGNOSIS — B079 Viral wart, unspecified: Secondary | ICD-10-CM | POA: Diagnosis not present

## 2015-05-30 DIAGNOSIS — Z808 Family history of malignant neoplasm of other organs or systems: Secondary | ICD-10-CM | POA: Diagnosis not present

## 2015-05-30 DIAGNOSIS — C44722 Squamous cell carcinoma of skin of right lower limb, including hip: Secondary | ICD-10-CM | POA: Diagnosis not present

## 2015-05-30 DIAGNOSIS — Z85828 Personal history of other malignant neoplasm of skin: Secondary | ICD-10-CM | POA: Diagnosis not present

## 2015-05-30 DIAGNOSIS — L814 Other melanin hyperpigmentation: Secondary | ICD-10-CM | POA: Diagnosis not present

## 2015-05-30 DIAGNOSIS — L821 Other seborrheic keratosis: Secondary | ICD-10-CM | POA: Diagnosis not present

## 2015-05-30 DIAGNOSIS — L570 Actinic keratosis: Secondary | ICD-10-CM | POA: Diagnosis not present

## 2015-05-30 DIAGNOSIS — D225 Melanocytic nevi of trunk: Secondary | ICD-10-CM | POA: Diagnosis not present

## 2015-05-30 DIAGNOSIS — D485 Neoplasm of uncertain behavior of skin: Secondary | ICD-10-CM | POA: Diagnosis not present

## 2015-06-11 ENCOUNTER — Other Ambulatory Visit: Payer: Self-pay | Admitting: Cardiology

## 2015-06-11 MED ORDER — NITROGLYCERIN 0.4 MG SL SUBL: 0.4000 mg | SUBLINGUAL_TABLET | SUBLINGUAL | Status: DC | PRN

## 2015-06-14 DIAGNOSIS — M50323 Other cervical disc degeneration at C6-C7 level: Secondary | ICD-10-CM | POA: Diagnosis not present

## 2015-06-14 DIAGNOSIS — M50322 Other cervical disc degeneration at C5-C6 level: Secondary | ICD-10-CM | POA: Diagnosis not present

## 2015-06-14 DIAGNOSIS — G894 Chronic pain syndrome: Secondary | ICD-10-CM | POA: Diagnosis not present

## 2015-06-14 DIAGNOSIS — M50321 Other cervical disc degeneration at C4-C5 level: Secondary | ICD-10-CM | POA: Diagnosis not present

## 2015-06-17 DIAGNOSIS — C44722 Squamous cell carcinoma of skin of right lower limb, including hip: Secondary | ICD-10-CM | POA: Diagnosis not present

## 2015-06-28 ENCOUNTER — Ambulatory Visit (INDEPENDENT_AMBULATORY_CARE_PROVIDER_SITE_OTHER): Payer: Medicare HMO | Admitting: Pharmacist

## 2015-06-28 DIAGNOSIS — I4891 Unspecified atrial fibrillation: Secondary | ICD-10-CM

## 2015-06-28 DIAGNOSIS — Z5181 Encounter for therapeutic drug level monitoring: Secondary | ICD-10-CM | POA: Diagnosis not present

## 2015-06-28 LAB — POCT INR: INR: 2

## 2015-07-10 DIAGNOSIS — Z955 Presence of coronary angioplasty implant and graft: Secondary | ICD-10-CM | POA: Diagnosis not present

## 2015-07-10 DIAGNOSIS — I252 Old myocardial infarction: Secondary | ICD-10-CM | POA: Diagnosis not present

## 2015-07-10 DIAGNOSIS — I10 Essential (primary) hypertension: Secondary | ICD-10-CM | POA: Diagnosis not present

## 2015-07-10 DIAGNOSIS — I480 Paroxysmal atrial fibrillation: Secondary | ICD-10-CM | POA: Diagnosis not present

## 2015-09-06 DIAGNOSIS — Z1231 Encounter for screening mammogram for malignant neoplasm of breast: Secondary | ICD-10-CM | POA: Diagnosis not present

## 2015-09-06 DIAGNOSIS — Z124 Encounter for screening for malignant neoplasm of cervix: Secondary | ICD-10-CM | POA: Diagnosis not present

## 2015-09-06 DIAGNOSIS — Z6823 Body mass index (BMI) 23.0-23.9, adult: Secondary | ICD-10-CM | POA: Diagnosis not present

## 2015-09-13 DIAGNOSIS — D485 Neoplasm of uncertain behavior of skin: Secondary | ICD-10-CM | POA: Diagnosis not present

## 2015-09-13 DIAGNOSIS — L570 Actinic keratosis: Secondary | ICD-10-CM | POA: Diagnosis not present

## 2015-09-13 DIAGNOSIS — C44329 Squamous cell carcinoma of skin of other parts of face: Secondary | ICD-10-CM | POA: Diagnosis not present

## 2015-09-16 ENCOUNTER — Encounter: Payer: Self-pay | Admitting: Internal Medicine

## 2015-09-17 DIAGNOSIS — G894 Chronic pain syndrome: Secondary | ICD-10-CM | POA: Diagnosis not present

## 2015-09-17 DIAGNOSIS — M5136 Other intervertebral disc degeneration, lumbar region: Secondary | ICD-10-CM | POA: Diagnosis not present

## 2015-09-17 DIAGNOSIS — M50322 Other cervical disc degeneration at C5-C6 level: Secondary | ICD-10-CM | POA: Diagnosis not present

## 2015-09-17 DIAGNOSIS — M50323 Other cervical disc degeneration at C6-C7 level: Secondary | ICD-10-CM | POA: Diagnosis not present

## 2015-09-27 ENCOUNTER — Ambulatory Visit (INDEPENDENT_AMBULATORY_CARE_PROVIDER_SITE_OTHER): Payer: Medicare HMO | Admitting: Pharmacist

## 2015-09-27 DIAGNOSIS — Z5181 Encounter for therapeutic drug level monitoring: Secondary | ICD-10-CM

## 2015-09-27 DIAGNOSIS — I4891 Unspecified atrial fibrillation: Secondary | ICD-10-CM

## 2015-09-27 LAB — POCT INR: INR: 1.9

## 2015-10-03 DIAGNOSIS — M545 Low back pain: Secondary | ICD-10-CM | POA: Diagnosis not present

## 2015-10-03 DIAGNOSIS — M50323 Other cervical disc degeneration at C6-C7 level: Secondary | ICD-10-CM | POA: Diagnosis not present

## 2015-10-08 ENCOUNTER — Other Ambulatory Visit: Payer: Self-pay | Admitting: Physical Medicine and Rehabilitation

## 2015-10-08 DIAGNOSIS — M542 Cervicalgia: Secondary | ICD-10-CM

## 2015-10-09 DIAGNOSIS — M5136 Other intervertebral disc degeneration, lumbar region: Secondary | ICD-10-CM | POA: Diagnosis not present

## 2015-10-09 DIAGNOSIS — M50323 Other cervical disc degeneration at C6-C7 level: Secondary | ICD-10-CM | POA: Diagnosis not present

## 2015-10-09 DIAGNOSIS — M4150 Other secondary scoliosis, site unspecified: Secondary | ICD-10-CM | POA: Diagnosis not present

## 2015-10-10 ENCOUNTER — Other Ambulatory Visit: Payer: Medicare HMO

## 2015-10-14 ENCOUNTER — Other Ambulatory Visit: Payer: Self-pay | Admitting: Cardiology

## 2015-10-14 DIAGNOSIS — C44329 Squamous cell carcinoma of skin of other parts of face: Secondary | ICD-10-CM | POA: Diagnosis not present

## 2015-10-23 ENCOUNTER — Ambulatory Visit
Admission: RE | Admit: 2015-10-23 | Discharge: 2015-10-23 | Disposition: A | Discharge status: Home / Self Care | Payer: Medicare HMO | Source: Ambulatory Visit | Attending: Physical Medicine and Rehabilitation | Admitting: Physical Medicine and Rehabilitation

## 2015-10-23 DIAGNOSIS — M542 Cervicalgia: Secondary | ICD-10-CM

## 2015-10-23 DIAGNOSIS — R221 Localized swelling, mass and lump, neck: Secondary | ICD-10-CM | POA: Diagnosis not present

## 2015-10-23 MED ORDER — IOPAMIDOL (ISOVUE-300) INJECTION 61%
75.0000 mL | Freq: Once | INTRAVENOUS | Status: AC | PRN
  Administered 2015-10-23: 75 mL via INTRAVENOUS

## 2015-11-01 ENCOUNTER — Ambulatory Visit (INDEPENDENT_AMBULATORY_CARE_PROVIDER_SITE_OTHER): Payer: Medicare HMO

## 2015-11-01 DIAGNOSIS — Z5181 Encounter for therapeutic drug level monitoring: Secondary | ICD-10-CM

## 2015-11-01 DIAGNOSIS — I4891 Unspecified atrial fibrillation: Secondary | ICD-10-CM

## 2015-11-01 LAB — POCT INR: INR: 1.8

## 2015-11-15 ENCOUNTER — Ambulatory Visit (INDEPENDENT_AMBULATORY_CARE_PROVIDER_SITE_OTHER): Payer: Medicare HMO | Admitting: Internal Medicine

## 2015-11-15 ENCOUNTER — Encounter: Payer: Self-pay | Admitting: Internal Medicine

## 2015-11-15 VITALS — BP 118/64 | HR 76 | Ht 65.0 in | Wt 152.0 lb

## 2015-11-15 DIAGNOSIS — K5903 Drug induced constipation: Secondary | ICD-10-CM

## 2015-11-15 DIAGNOSIS — T402X5A Adverse effect of other opioids, initial encounter: Secondary | ICD-10-CM

## 2015-11-15 DIAGNOSIS — K648 Other hemorrhoids: Secondary | ICD-10-CM | POA: Diagnosis not present

## 2015-11-15 MED ORDER — LUBIPROSTONE 24 MCG PO CAPS: 24.0000 ug | ORAL_CAPSULE | Freq: Two times a day (BID) | ORAL | Status: DC

## 2015-11-15 MED ORDER — HYDROCORTISONE 2.5 % RE CREA: 1.0000 "application " | TOPICAL_CREAM | Freq: Two times a day (BID) | RECTAL | Status: DC | PRN

## 2015-11-15 NOTE — Patient Instructions (Signed)
   We have sent the following medications to your pharmacy for you to pick up at your convenience: Amitiza, hydrocortisone cream     I appreciate the opportunity to care for you. Silvano Rusk, MD, [facility]

## 2015-11-15 NOTE — Progress Notes (Signed)
Referred by: Marylynn Pearson, MD [address], [address] [place], [place] [address]  Subjective:    Patient ID: [name], female    DOB: [birth year], 60 y.o.   MRN: [id number]  CC: Rectal bleeding  HPI  60 y.o. Female with a pmh of anxiety, depression, diverticulosis on colonoscopy in 2015, external hemorrhoids, chronic pain, AF on coumadin, presenting with rectal bleeding and constipation. Constipation present since 2009 when she was started on opioids, taking colace and correctol.  Has 1-2 bowel movements a day described as watery to loose. Rectal bleeding is intermittent described as  bright red blood on wiping, but no pain with bowel movements  Denies any fevers, chills, weight loss, nausea, and vomiting.  No Known Allergies Outpatient Prescriptions Prior to Visit  Medication Sig Dispense Refill  . ALPRAZolam (XANAX) 0.5 MG tablet Take 0.5 mg by mouth 3 (three) times daily as needed for anxiety.     Marland Kitchen aspirin 81 MG tablet Take 81 mg by mouth daily.    Marland Kitchen atorvastatin (LIPITOR) 80 MG tablet TAKE 1 TABLET (80 MG TOTAL) BY MOUTH DAILY. 90 tablet 2  . docusate sodium (COLACE) 100 MG capsule Take 100 mg by mouth daily.    Marland Kitchen ezetimibe (ZETIA) 10 MG tablet Take 1 tablet (10 mg total) by mouth daily. 90 tablet 3  . furosemide (LASIX) 40 MG tablet Take 1 tablet (40 mg total) by mouth daily. 30 tablet 11  . gabapentin (NEURONTIN) 300 MG capsule Take 400 mg by mouth daily.     . metoprolol tartrate (LOPRESSOR) 25 MG tablet Take 1 tablet (25 mg total) by mouth 2 (two) times daily. 180 tablet 2  . morphine (MSIR) 15 MG tablet Take 15 mg by mouth 2 (two) times daily.    . nitroGLYCERIN (NITROSTAT) 0.4 MG SL tablet Place 1 tablet (0.4 mg total) under the tongue every 5 (five) minutes as needed for chest pain. 25 tablet 3  . potassium chloride 20 MEQ TBCR Take 20 mEq by mouth 2 (two) times daily. 60 tablet 11  . PROAIR HFA 108 (90 BASE) MCG/ACT inhaler Inhale 2 puffs into the lungs as needed.  For wheezing  0  . temazepam (RESTORIL) 30 MG capsule Take 15 mg by mouth at bedtime as needed for sleep.     Marland Kitchen warfarin (COUMADIN) 5 MG tablet USE AS DIRECTED 50 tablet 2   No facility-administered medications prior to visit.   Past Medical History  Diagnosis Date  . Hypertension   . Hyperlipemia   . Chronic pain     "back and legs"  . Anxiety   . Migraine headache   . Hepatitis     Hepatitis A  . Myocardial infarction ([place]) 06/29/11  . Pneumonia   . Depression   . Sleep apnea     declined CPAP  . History of weight loss     11-22-13 100 pounds lost over past year ;as intentional weight loss.  . Degenerative joint disease     cervical disc pain wears  Fentanyl Transdermal patch  . CAD (coronary artery disease), native coronary artery     s/p NSTEMI tx with DES x 2 LAD in 2013  . PAF (paroxysmal atrial fibrillation) ([place]) 02/19/2015  . External hemorrhoids    Past Surgical History  Procedure Laterality Date  . Abdominal hysterectomy    . Abdominal surgery      removal of benign pelvic tumore  . Tmj arthroplasty    . Replacement total knee    .  Joint replacement      bilateral knee replacements  . Knee surgery      "multiple knee scopes and OR bilaterally; final outcome bilateral knee replacements"  . Tonsillectomy and adenoidectomy    . Coronary angioplasty with stent placement    . Colonoscopy with propofol N/A 11/30/2013    Procedure: COLONOSCOPY WITH PROPOFOL;  Surgeon: Juanita Craver, MD;  Location: [facility];  Service: Endoscopy;  Laterality: N/A;  . Left heart catheterization with coronary angiogram N/A 06/30/2011    Procedure: LEFT HEART CATHETERIZATION WITH CORONARY ANGIOGRAM;  Surgeon: Jettie Booze, MD;  Location: [facility] CATH LAB;  Service: Cardiovascular;  Laterality: N/A;  . Percutaneous coronary stent intervention (pci-s)  06/30/2011    Procedure: PERCUTANEOUS CORONARY STENT INTERVENTION (PCI-S);  Surgeon: Jettie Booze, MD;  Location: [facility] CATH LAB;   Service: Cardiovascular;;   Social History   Social History  . Marital Status: Single    Spouse Name: N/A  . Number of Children: 0  . Years of Education: N/A   Social History Main Topics  . Smoking status: Light Tobacco Smoker -- 0.04 packs/day for 30 years    Types: Cigarettes    Start date: 01/29/2014  . Smokeless tobacco: Never Used     Comment: smoking consult entered 06/30/11 1503. / 11-22-13 states about 6 cigarettes per day  . Alcohol Use: 0.0 oz/week    0 Standard drinks or equivalent per week     Comment: rare  . Drug Use: No  . Sexual Activity: Not Currently   Other Topics Concern  . None   Social History Narrative   Family History  Problem Relation Age of Onset  . Rectal cancer Father     deceased  . Heart failure Mother   . Heart attack Mother     Review of Systems See HPI all other systems negative    Objective:   Physical Exam   @BP  118/64 mmHg  Pulse 76  Ht 5\' 5"  (1.651 m)  Wt 152 lb (68.947 kg)  BMI 25.29 kg/m2@  General:  Well-developed, well-nourished and in no acute distress Eyes:  anicteric. ENT:   Mouth and posterior pharynx free of lesions.  Lungs: Clear to auscultation bilaterally. Heart:  Irregularly irregular, no rubs, murmurs, gallops. Abdomen:  soft, non-tender, no hepatosplenomegaly, hernia, or mass and BS+.  Rectal: Patti Martinique CMA present for exam.  NL anoderm.  Increased sphincter tone, no masses and non-tender  Anoscopy:  grade II internal hemorrhoids in all postions  Extremities:   no edema, cyanosis or clubbing Skin   no rash. Neuro:  A&O x 3.  Psych:  appropriate mood and  Affect.   Data Reviewed: Colonoscopy from 2015     Assessment & Plan:   Encounter Diagnoses  Name Primary?  . Constipation due to opioid therapy Yes  . Internal bleeding hemorrhoids    Given her bowel movements are loose to watery with colace and correctol, to manage her diarrhea will stop medications and start Amitiza 24 ug bid - may be  better for her and worth a try Grade 2 hemorrhoids will be manage with hydrocortisone cream. She is on coumadin and planning to have a thyroid biopsy. Will leaving banding as an option in the future.  Would have to hold warfarin and could be tricy,.  if stools not so loose perhaps hemorrhoids will bleed less.   I have seen the patient with Mr. Grayland Ormond PA-S  and he has served as a Education administrator.  Cc:Marjorie Smolder, MD Marylynn Pearson, MD

## 2015-11-22 ENCOUNTER — Telehealth: Payer: Self-pay | Admitting: Cardiology

## 2015-11-22 DIAGNOSIS — E041 Nontoxic single thyroid nodule: Secondary | ICD-10-CM | POA: Diagnosis not present

## 2015-11-22 NOTE — Addendum Note (Signed)
Addended by: Fransico Him R on: 11/22/2015 11:27 AM   Modules accepted: Miquel Dunn

## 2015-11-22 NOTE — Telephone Encounter (Signed)
The pt is also taking Asa 81 mg daily. Please advise.

## 2015-11-22 NOTE — Telephone Encounter (Signed)
Request for surgical clearance:  1. What type of surgery is being performed? Biopsy of her thyroid  2. When is this surgery scheduled? No date scheduled   3. Are there any medications that need to be held prior to surgery and how long?Can pt stop her Coumadin for 3 to 5 days?   4. Name of physician performing surgery? Dr Armandina Gemma  5. What is your office phone and fax number? [phone] and fax is [phone]  6.

## 2015-11-22 NOTE — Telephone Encounter (Signed)
Ok to hold Coumadin for procedure

## 2015-11-25 ENCOUNTER — Other Ambulatory Visit: Payer: Self-pay | Admitting: Surgery

## 2015-11-25 ENCOUNTER — Other Ambulatory Visit: Payer: Self-pay | Admitting: *Deleted

## 2015-11-25 DIAGNOSIS — E041 Nontoxic single thyroid nodule: Secondary | ICD-10-CM

## 2015-11-25 MED ORDER — EZETIMIBE 10 MG PO TABS
10.0000 mg | ORAL_TABLET | Freq: Every day | ORAL | 0 refills | Status: DC
Start: 2015-11-25 — End: 2015-11-27

## 2015-11-26 ENCOUNTER — Other Ambulatory Visit: Payer: Self-pay | Admitting: Surgery

## 2015-11-26 ENCOUNTER — Ambulatory Visit
Admission: RE | Admit: 2015-11-26 | Discharge: 2015-11-26 | Disposition: A | Discharge status: Home / Self Care | Payer: Medicare HMO | Source: Ambulatory Visit | Attending: Surgery | Admitting: Surgery

## 2015-11-26 DIAGNOSIS — E042 Nontoxic multinodular goiter: Secondary | ICD-10-CM | POA: Diagnosis not present

## 2015-11-26 DIAGNOSIS — E041 Nontoxic single thyroid nodule: Secondary | ICD-10-CM

## 2015-11-27 ENCOUNTER — Other Ambulatory Visit: Payer: Self-pay

## 2015-11-27 ENCOUNTER — Telehealth: Payer: Self-pay | Admitting: Cardiology

## 2015-11-27 MED ORDER — EZETIMIBE 10 MG PO TABS: 10.0000 mg | ORAL_TABLET | Freq: Every day | ORAL | 0 refills | Status: AC

## 2015-11-27 NOTE — Telephone Encounter (Signed)
New message   Request for surgical clearance:  1. What type of surgery is being performed? Ultrasound When is this surgery scheduled? no date trying to get her in next week  2. Are there any medications that need to be held prior to surgery and how long?  Coumadin for 4 days   3. Name of physician performing surgery? Dr. Harlow Asa  4. What is your office phone and fax number? [phone], [phone]

## 2015-11-27 NOTE — Telephone Encounter (Signed)
Nydia C Jeannine Boga, MD at 02/19/2015 3:29 PM  ezetimibe (ZETIA) 10 MG tablet Take 1 tablet (10 mg total) by mouth daily.   Current medicines are reviewed at length with the patient today.  The patient does not have concerns regarding medicines.  The following changes have been made:  no change

## 2015-11-27 NOTE — Telephone Encounter (Signed)
See phone note from 7/21 in regards to clearance.

## 2015-11-27 NOTE — Telephone Encounter (Signed)
Fax sent to [facility]

## 2015-11-28 DIAGNOSIS — E041 Nontoxic single thyroid nodule: Secondary | ICD-10-CM | POA: Diagnosis not present

## 2015-12-03 ENCOUNTER — Ambulatory Visit (INDEPENDENT_AMBULATORY_CARE_PROVIDER_SITE_OTHER): Payer: Medicare HMO | Admitting: *Deleted

## 2015-12-03 ENCOUNTER — Encounter: Payer: Self-pay | Admitting: Cardiology

## 2015-12-03 ENCOUNTER — Ambulatory Visit
Admission: RE | Admit: 2015-12-03 | Discharge: 2015-12-03 | Disposition: A | Discharge status: Home / Self Care | Payer: Medicare HMO | Source: Ambulatory Visit | Attending: Surgery | Admitting: Surgery

## 2015-12-03 ENCOUNTER — Other Ambulatory Visit (HOSPITAL_COMMUNITY)
Admission: RE | Admit: 2015-12-03 | Discharge: 2015-12-03 | Disposition: A | Discharge status: Home / Self Care | Payer: Medicare HMO | Source: Ambulatory Visit | Attending: General Surgery | Admitting: General Surgery

## 2015-12-03 DIAGNOSIS — E049 Nontoxic goiter, unspecified: Secondary | ICD-10-CM | POA: Diagnosis not present

## 2015-12-03 DIAGNOSIS — Z5181 Encounter for therapeutic drug level monitoring: Secondary | ICD-10-CM

## 2015-12-03 DIAGNOSIS — E041 Nontoxic single thyroid nodule: Secondary | ICD-10-CM

## 2015-12-03 DIAGNOSIS — I4891 Unspecified atrial fibrillation: Secondary | ICD-10-CM

## 2015-12-03 LAB — POCT INR: INR: 1

## 2015-12-04 ENCOUNTER — Encounter: Payer: Self-pay | Admitting: Cardiology

## 2015-12-09 ENCOUNTER — Other Ambulatory Visit: Payer: Self-pay | Admitting: Cardiology

## 2015-12-10 ENCOUNTER — Other Ambulatory Visit: Payer: Self-pay | Admitting: Cardiology

## 2015-12-10 MED ORDER — POTASSIUM CHLORIDE ER 20 MEQ PO TBCR: 20.0000 meq | EXTENDED_RELEASE_TABLET | Freq: Two times a day (BID) | ORAL | 11 refills | Status: DC

## 2015-12-12 ENCOUNTER — Ambulatory Visit: Payer: Self-pay | Admitting: Surgery

## 2015-12-12 DIAGNOSIS — E041 Nontoxic single thyroid nodule: Secondary | ICD-10-CM | POA: Diagnosis not present

## 2015-12-13 ENCOUNTER — Ambulatory Visit (INDEPENDENT_AMBULATORY_CARE_PROVIDER_SITE_OTHER): Payer: Medicare HMO

## 2015-12-13 DIAGNOSIS — Z5181 Encounter for therapeutic drug level monitoring: Secondary | ICD-10-CM

## 2015-12-13 DIAGNOSIS — I4891 Unspecified atrial fibrillation: Secondary | ICD-10-CM

## 2015-12-13 LAB — POCT INR: INR: 1.8

## 2015-12-17 DIAGNOSIS — G894 Chronic pain syndrome: Secondary | ICD-10-CM | POA: Diagnosis not present

## 2015-12-17 DIAGNOSIS — M5136 Other intervertebral disc degeneration, lumbar region: Secondary | ICD-10-CM | POA: Diagnosis not present

## 2015-12-17 DIAGNOSIS — M50323 Other cervical disc degeneration at C6-C7 level: Secondary | ICD-10-CM | POA: Diagnosis not present

## 2015-12-17 DIAGNOSIS — Z79891 Long term (current) use of opiate analgesic: Secondary | ICD-10-CM | POA: Diagnosis not present

## 2015-12-18 ENCOUNTER — Telehealth: Payer: Self-pay

## 2015-12-18 NOTE — Telephone Encounter (Signed)
Patient called frustrated because Dr. Gala Lewandowsky office has not received clearance for surgery. Informed her that surgical clearance has not been received, but Dr. Gala Lewandowsky office would be called for clarification.  Spoke with Dr. Gala Lewandowsky office. Patient needs cardiac clearance and coumadin instructions for a thyroid lobectomy (date to be determined based on clearance).  Clearance to be faxed to [phone].  To Dr. Radford Pax.

## 2015-12-18 NOTE — Telephone Encounter (Signed)
Patient is stable from a cardiac standpoint for surgery.  Please defer coumadin instructions to Coumadin clinic

## 2015-12-18 NOTE — Telephone Encounter (Signed)
Please find out if patient has been having any chest pain

## 2015-12-18 NOTE — Telephone Encounter (Signed)
Patient denies chest pain and SOB.

## 2015-12-19 ENCOUNTER — Ambulatory Visit: Payer: Medicare HMO | Admitting: Cardiology

## 2015-12-19 NOTE — Telephone Encounter (Signed)
Pt takes Coumadin for afib with CHADS2 score of 1 (HTN). Ok to hold Coumadin x5 days prior to procedure. Clearance faxed to [phone].

## 2015-12-22 ENCOUNTER — Other Ambulatory Visit: Payer: Self-pay | Admitting: Cardiology

## 2015-12-27 ENCOUNTER — Ambulatory Visit (INDEPENDENT_AMBULATORY_CARE_PROVIDER_SITE_OTHER): Payer: Medicare HMO | Admitting: *Deleted

## 2015-12-27 DIAGNOSIS — I4891 Unspecified atrial fibrillation: Secondary | ICD-10-CM

## 2015-12-27 DIAGNOSIS — Z5181 Encounter for therapeutic drug level monitoring: Secondary | ICD-10-CM | POA: Diagnosis not present

## 2015-12-27 LAB — POCT INR: INR: 1.9

## 2015-12-27 NOTE — Patient Instructions (Addendum)
Hold coumadin 5 days prior to procedure 01/09/2016 (for thyroid tumor surgery), last dose 01/04/2016 When you start back on your coumadin, per MD order, take an extra 1/2 tablet with first dose to boost your INR This is usually day of surgery.

## 2016-01-06 ENCOUNTER — Encounter (HOSPITAL_COMMUNITY): Payer: Self-pay | Admitting: Surgery

## 2016-01-06 DIAGNOSIS — D440 Neoplasm of uncertain behavior of thyroid gland: Secondary | ICD-10-CM | POA: Diagnosis present

## 2016-01-06 NOTE — H&P (Signed)
  General Surgery [facility]  [name] DOB: 31-Jul-1955 Single / Language: [name] / Race: White Female  History of Present Illness  The patient is a 60 year old female who presents with a thyroid nodule.  Patient returns following fine-needle aspiration biopsy of the dominant mass in the left thyroid lobe. Cytopathology shows this to be benign, Bethesda category II. Patient may have some mild compressive symptoms. She has occasional discomfort. She has discussed this with her brother-in-law who is a Psychologist, sport and exercise. He has recommended surgical resection. Patient is in agreement. We discussed the risk and benefits of the surgery today. She would like to proceed with left thyroid lobectomy. Ultrasound shows a normal right thyroid lobe. Current TSH level is normal at 2.470. Patient does not take thyroid medication. Patient is on anticoagulation. She is followed by her cardiologist. She will require cardiac clearance for any surgical intervention.   Allergies No Known Drug Allergies07/21/2017  Medication History Warfarin Sodium (7.5MG  Tablet, Oral) Active. (10mg  monday-friday and 7.5mg  on the weekends) Atorvastatin Calcium (80MG  Tablet, Oral) Active. Metoprolol Succinate ER (25MG  Tablet ER 24HR, Oral) Active. Temazepam (15MG  Capsule, Oral) Active. Neurontin (400MG  Capsule, Oral) Active. Potassium Chloride (20MEQ Tablet ER, Oral) Active. Lasix (40MG  Tablet, Oral) Active. Zetia (10MG  Tablet, Oral) Active. Xanax (0.5MG  Tablet, Oral) Active. Nitroglycerin (0.4MG  Tab Sublingual, Sublingual) Active. Colace (100MG  Capsule, Oral) Active. Aspirin (81MG  Tablet DR, Oral) Active. Cymbalta (60MG  Capsule DR Part, 90 Oral) Active. Cymbalta (30MG  Capsule DR Part, 90 Oral) Active. Motrin (100MG  Tablet, Oral) Active. Baclofen (10MG  Tablet, Oral) Active. Morphine Sulfate (15MG  Tablet, Oral) Active. Medications Reconciled  Vitals Weight: 147 lb  Height: 66in Body Surface Area: 1.75 m Body Mass Index: 23.73 kg/m  Temp.: 98.66F(Temporal)  Pulse: 68 (Regular)  BP: 126/78 (Sitting, Left Arm, Standard)  Physical Exam  The physical exam findings are as follows: Note:General - appears comfortable, no distress; not diaphorectic  HEENT - normocephalic; sclerae clear, gaze conjugate; mucous membranes moist, dentition good; voice normal  Neck - asymmetric on extension; no palpable anterior or posterior cervical adenopathy; dominant palpable mass left thyroid lobe extending beneath the left clavicle  Chest - clear bilaterally without rhonchi, rales, or wheeze  Cor - regular rhythm with normal rate; no significant murmur  Ext - non-tender without significant edema or lymphedema  Neuro - grossly intact; no tremor   Assessment & Plan  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 Certainly she has the option of continued observation with the favorable biopsy report. However, the patient has discussed options with her family and has decided to proceed with left thyroid lobectomy.  We discussed the risk and benefits of the procedure. We discussed the hospital stay to be anticipated. She will require cardiac clearance prior to scheduling surgery. We would plan left thyroid lobectomy. I would then monitor her thyroid hormone levels postoperatively before starting her on thyroid hormone replacement. Patient understands and wishes to proceed.  The risks and benefits of the procedure have been discussed at length with the patient. The patient understands the proposed procedure, potential alternative treatments, and the course of recovery to be expected. All of the patient's questions have been answered at this time. The patient wishes to proceed with surgery.  Jacqueline Regal, MD, [facility] Office: [phone]

## 2016-01-08 ENCOUNTER — Encounter (HOSPITAL_COMMUNITY): Payer: Self-pay

## 2016-01-08 ENCOUNTER — Encounter (HOSPITAL_COMMUNITY)
Admission: RE | Admit: 2016-01-08 | Discharge: 2016-01-08 | Disposition: A | Discharge status: Home / Self Care | Payer: Medicare HMO | Source: Ambulatory Visit | Attending: Surgery | Admitting: Surgery

## 2016-01-08 DIAGNOSIS — Z9119 Patient's noncompliance with other medical treatment and regimen: Secondary | ICD-10-CM | POA: Diagnosis not present

## 2016-01-08 DIAGNOSIS — Z7901 Long term (current) use of anticoagulants: Secondary | ICD-10-CM | POA: Diagnosis not present

## 2016-01-08 DIAGNOSIS — G4730 Sleep apnea, unspecified: Secondary | ICD-10-CM | POA: Diagnosis not present

## 2016-01-08 DIAGNOSIS — I252 Old myocardial infarction: Secondary | ICD-10-CM | POA: Diagnosis not present

## 2016-01-08 DIAGNOSIS — D34 Benign neoplasm of thyroid gland: Secondary | ICD-10-CM | POA: Diagnosis not present

## 2016-01-08 DIAGNOSIS — Z79891 Long term (current) use of opiate analgesic: Secondary | ICD-10-CM | POA: Diagnosis not present

## 2016-01-08 DIAGNOSIS — D440 Neoplasm of uncertain behavior of thyroid gland: Secondary | ICD-10-CM | POA: Diagnosis present

## 2016-01-08 DIAGNOSIS — I509 Heart failure, unspecified: Secondary | ICD-10-CM | POA: Diagnosis not present

## 2016-01-08 DIAGNOSIS — Z7982 Long term (current) use of aspirin: Secondary | ICD-10-CM | POA: Diagnosis not present

## 2016-01-08 DIAGNOSIS — R69 Illness, unspecified: Secondary | ICD-10-CM | POA: Diagnosis not present

## 2016-01-08 DIAGNOSIS — I2510 Atherosclerotic heart disease of native coronary artery without angina pectoris: Secondary | ICD-10-CM | POA: Diagnosis not present

## 2016-01-08 DIAGNOSIS — F329 Major depressive disorder, single episode, unspecified: Secondary | ICD-10-CM | POA: Diagnosis not present

## 2016-01-08 DIAGNOSIS — I110 Hypertensive heart disease with heart failure: Secondary | ICD-10-CM | POA: Diagnosis not present

## 2016-01-08 DIAGNOSIS — F419 Anxiety disorder, unspecified: Secondary | ICD-10-CM | POA: Diagnosis not present

## 2016-01-08 DIAGNOSIS — I4891 Unspecified atrial fibrillation: Secondary | ICD-10-CM | POA: Diagnosis not present

## 2016-01-08 DIAGNOSIS — F17200 Nicotine dependence, unspecified, uncomplicated: Secondary | ICD-10-CM | POA: Diagnosis not present

## 2016-01-08 DIAGNOSIS — Z79899 Other long term (current) drug therapy: Secondary | ICD-10-CM | POA: Diagnosis not present

## 2016-01-08 DIAGNOSIS — Z955 Presence of coronary angioplasty implant and graft: Secondary | ICD-10-CM | POA: Diagnosis not present

## 2016-01-08 HISTORY — DX: Cardiac arrhythmia, unspecified: I49.9

## 2016-01-08 HISTORY — DX: Personal history of urinary (tract) infections: Z87.440

## 2016-01-08 HISTORY — DX: Sepsis, unspecified organism: A41.9

## 2016-01-08 HISTORY — DX: Malignant (primary) neoplasm, unspecified: C80.1

## 2016-01-08 HISTORY — DX: Personal history of other diseases of the respiratory system: Z87.09

## 2016-01-08 LAB — COMPREHENSIVE METABOLIC PANEL
ALT: 38 U/L (ref 14–54)
ANION GAP: 5 (ref 5–15)
AST: 28 U/L (ref 15–41)
Albumin: 4.1 g/dL (ref 3.5–5.0)
Alkaline Phosphatase: 60 U/L (ref 38–126)
BUN: 18 mg/dL (ref 6–20)
CHLORIDE: 107 mmol/L (ref 101–111)
CO2: 26 mmol/L (ref 22–32)
Calcium: 9.3 mg/dL (ref 8.9–10.3)
Creatinine, Ser: 0.71 mg/dL (ref 0.44–1.00)
GFR calc non Af Amer: >60 mL/min (ref >60)
Glucose, Bld: 102 mg/dL — ABNORMAL HIGH (ref 65–99)
POTASSIUM: 4 mmol/L (ref 3.5–5.1)
SODIUM: 138 mmol/L (ref 135–145)
Total Bilirubin: 0.5 mg/dL (ref 0.3–1.2)
Total Protein: 6.5 g/dL (ref 6.5–8.1)

## 2016-01-08 LAB — CBC
HCT: 42.2 % (ref 36.0–46.0)
HEMOGLOBIN: 14.6 g/dL (ref 12.0–15.0)
MCH: 31.2 pg (ref 26.0–34.0)
MCHC: 34.6 g/dL (ref 30.0–36.0)
MCV: 90.2 fL (ref 78.0–100.0)
Platelets: 238 10*3/uL (ref 150–400)
RBC: 4.68 MIL/uL (ref 3.87–5.11)
RDW: 12.5 % (ref 11.5–15.5)
WBC: 8.5 10*3/uL (ref 4.0–10.5)

## 2016-01-08 LAB — PROTIME-INR
INR: 1.02
Prothrombin Time: 13.4 seconds (ref 11.4–15.2)

## 2016-01-08 NOTE — Patient Instructions (Signed)
[name]  01/08/2016   Your procedure is scheduled on: Thursday January 09, 2016   Report to [facility]  Entrance take [place]  elevators to 3rd floor to  [place] at 7:15 AM.  Call this number if you have problems the morning of surgery [phone]   Remember: ONLY 1 PERSON MAY GO WITH YOU TO SHORT STAY TO GET  READY MORNING OF [place].  Do not eat food or drink liquids :After Midnight.     Take these medicines the morning of surgery with A SIP OF WATER: Alprazalam (Xanax) if needed; Duloxetine (Cymbalta); Morphine; Metoprolol; May use proair inhaler if needed                                You may not have any metal on your body including hair pins and              piercings  Do not wear jewelry, make-up, lotions, powders or perfumes, deodorant             Do not wear nail polish.  Do not shave  48 hours prior to surgery.               Do not bring valuables to the hospital. [place].  Contacts, dentures or bridgework may not be worn into surgery.  Leave suitcase in the car. After surgery it may be brought to your room.    Special Instructions:NO SMOKING 24 HOURS PRIOR TO SURGICAL PROCEDURE DATE                _____________________________________________________________________             [facility] - Preparing for Surgery Before surgery, you can play an important role.  Because skin is not sterile, your skin needs to be as free of germs as possible.  You can reduce the number of germs on your skin by washing with CHG (chlorahexidine gluconate) soap before surgery.  CHG is an antiseptic cleaner which kills germs and bonds with the skin to continue killing germs even after washing. Please DO NOT use if you have an allergy to CHG or antibacterial soaps.  If your skin becomes reddened/irritated stop using the CHG and inform your nurse when you arrive at Short Stay. Do not shave (including  legs and underarms) for at least 48 hours prior to the first CHG shower.  You may shave your face/neck. Please follow these instructions carefully:  1.  Shower with CHG Soap the night before surgery and the  morning of Surgery.  2.  If you choose to wash your hair, wash your hair first as usual with your  normal  shampoo.  3.  After you shampoo, rinse your hair and body thoroughly to remove the  shampoo.                           4.  Use CHG as you would any other liquid soap.  You can apply chg directly  to the skin and wash                       Gently with a  scrungie or clean washcloth.  5.  Apply the CHG Soap to your body ONLY FROM THE NECK DOWN.   Do not use on face/ open                           Wound or open sores. Avoid contact with eyes, ears mouth and genitals (private parts).                       Wash face,  Genitals (private parts) with your normal soap.             6.  Wash thoroughly, paying special attention to the area where your surgery  will be performed.  7.  Thoroughly rinse your body with warm water from the neck down.  8.  DO NOT shower/wash with your normal soap after using and rinsing off  the CHG Soap.                9.  Pat yourself dry with a clean towel.            10.  Wear clean pajamas.            11.  Place clean sheets on your bed the night of your first shower and do not  sleep with pets. Day of Surgery : Do not apply any lotions/deodorants the morning of surgery.  Please wear clean clothes to the hospital/surgery center.  FAILURE TO FOLLOW THESE INSTRUCTIONS MAY RESULT IN THE CANCELLATION OF YOUR SURGERY PATIENT SIGNATURE_________________________________  NURSE SIGNATURE__________________________________  ________________________________________________________________________

## 2016-01-08 NOTE — Anesthesia Preprocedure Evaluation (Addendum)
Anesthesia Evaluation    Airway Mallampati: III  TM Distance: >3 FB Neck ROM: Full    Dental  (+) Teeth Intact   Pulmonary sleep apnea (Noncompliant with CPAP) , pneumonia, Current Smoker,    Pulmonary exam normal breath sounds clear to auscultation       Cardiovascular hypertension, Pt. on medications and Pt. on home beta blockers + CAD, + Past MI, + Cardiac Stents and +CHF  Normal cardiovascular exam+ dysrhythmias Atrial Fibrillation  Rhythm:Regular Rate:Normal  Ischemic CHF with last EF 50%, no symptoms of chest pain, LAD stent x2 placed in 2013.Marland Kitchen She has cardiology clearance for surgery, hx of afib   Neuro/Psych Anxiety Depression Chronic back and leg pain    GI/Hepatic (+) Hepatitis -, A  Endo/Other    Renal/GU      Musculoskeletal  (+) Arthritis ,   Abdominal   Peds  Hematology   Anesthesia Other Findings Chronic pain with fentanyl patch  Reproductive/Obstetrics                            Anesthesia Physical  Anesthesia Plan  ASA: III  Anesthesia Plan: General   Post-op Pain Management:    Induction: Intravenous  Airway Management Planned: Oral ETT  Additional Equipment:   Intra-op Plan:   Post-operative Plan: Extubation in OR  Informed Consent: I have reviewed the patients History and Physical, chart, labs and discussed the procedure including the risks, benefits and alternatives for the proposed anesthesia with the patient or authorized representative who has indicated his/her understanding and acceptance.   Dental advisory given  Plan Discussed with: CRNA  Anesthesia Plan Comments:         Anesthesia Quick Evaluation

## 2016-01-09 ENCOUNTER — Ambulatory Visit (HOSPITAL_COMMUNITY): Payer: Medicare HMO | Admitting: Anesthesiology

## 2016-01-09 ENCOUNTER — Encounter (HOSPITAL_COMMUNITY): Payer: Self-pay | Admitting: *Deleted

## 2016-01-09 ENCOUNTER — Observation Stay (HOSPITAL_COMMUNITY)
Admission: RE | Admit: 2016-01-09 | Discharge: 2016-01-10 | Disposition: A | Discharge status: Home / Self Care | Payer: Medicare HMO | Source: Ambulatory Visit | Attending: Surgery | Admitting: Surgery

## 2016-01-09 ENCOUNTER — Encounter (HOSPITAL_COMMUNITY)
Admission: RE | Disposition: A | Discharge status: Home / Self Care | Payer: Self-pay | Source: Ambulatory Visit | Attending: Surgery

## 2016-01-09 DIAGNOSIS — I2510 Atherosclerotic heart disease of native coronary artery without angina pectoris: Secondary | ICD-10-CM | POA: Diagnosis not present

## 2016-01-09 DIAGNOSIS — G4730 Sleep apnea, unspecified: Secondary | ICD-10-CM | POA: Diagnosis not present

## 2016-01-09 DIAGNOSIS — F419 Anxiety disorder, unspecified: Secondary | ICD-10-CM | POA: Insufficient documentation

## 2016-01-09 DIAGNOSIS — I509 Heart failure, unspecified: Secondary | ICD-10-CM | POA: Insufficient documentation

## 2016-01-09 DIAGNOSIS — I10 Essential (primary) hypertension: Secondary | ICD-10-CM | POA: Diagnosis not present

## 2016-01-09 DIAGNOSIS — D440 Neoplasm of uncertain behavior of thyroid gland: Secondary | ICD-10-CM | POA: Diagnosis present

## 2016-01-09 DIAGNOSIS — Z79891 Long term (current) use of opiate analgesic: Secondary | ICD-10-CM | POA: Insufficient documentation

## 2016-01-09 DIAGNOSIS — I110 Hypertensive heart disease with heart failure: Secondary | ICD-10-CM | POA: Diagnosis not present

## 2016-01-09 DIAGNOSIS — Z7982 Long term (current) use of aspirin: Secondary | ICD-10-CM | POA: Insufficient documentation

## 2016-01-09 DIAGNOSIS — Z79899 Other long term (current) drug therapy: Secondary | ICD-10-CM | POA: Insufficient documentation

## 2016-01-09 DIAGNOSIS — F17200 Nicotine dependence, unspecified, uncomplicated: Secondary | ICD-10-CM | POA: Diagnosis not present

## 2016-01-09 DIAGNOSIS — D34 Benign neoplasm of thyroid gland: Principal | ICD-10-CM | POA: Insufficient documentation

## 2016-01-09 DIAGNOSIS — Z7901 Long term (current) use of anticoagulants: Secondary | ICD-10-CM | POA: Insufficient documentation

## 2016-01-09 DIAGNOSIS — I4891 Unspecified atrial fibrillation: Secondary | ICD-10-CM | POA: Insufficient documentation

## 2016-01-09 DIAGNOSIS — E041 Nontoxic single thyroid nodule: Secondary | ICD-10-CM | POA: Diagnosis not present

## 2016-01-09 DIAGNOSIS — I252 Old myocardial infarction: Secondary | ICD-10-CM | POA: Diagnosis not present

## 2016-01-09 DIAGNOSIS — F329 Major depressive disorder, single episode, unspecified: Secondary | ICD-10-CM | POA: Insufficient documentation

## 2016-01-09 DIAGNOSIS — Z955 Presence of coronary angioplasty implant and graft: Secondary | ICD-10-CM | POA: Insufficient documentation

## 2016-01-09 DIAGNOSIS — Z9119 Patient's noncompliance with other medical treatment and regimen: Secondary | ICD-10-CM | POA: Insufficient documentation

## 2016-01-09 DIAGNOSIS — R69 Illness, unspecified: Secondary | ICD-10-CM | POA: Diagnosis not present

## 2016-01-09 HISTORY — PX: THYROID LOBECTOMY: SHX420

## 2016-01-09 SURGERY — LOBECTOMY, THYROID
Anesthesia: General | Site: Neck | Laterality: Left

## 2016-01-09 MED ORDER — LIDOCAINE HCL (CARDIAC) 20 MG/ML IV SOLN
INTRAVENOUS | Status: DC | PRN
  Administered 2016-01-09: 25 mg via INTRATRACHEAL
  Administered 2016-01-09: 75 mg via INTRAVENOUS

## 2016-01-09 MED ORDER — EPHEDRINE SULFATE 50 MG/ML IJ SOLN
INTRAMUSCULAR | Status: DC | PRN
  Administered 2016-01-09: 5 mg via INTRAVENOUS

## 2016-01-09 MED ORDER — ALBUTEROL SULFATE (2.5 MG/3ML) 0.083% IN NEBU: 2.5000 mg | INHALATION_SOLUTION | RESPIRATORY_TRACT | Status: DC | PRN

## 2016-01-09 MED ORDER — ACETAMINOPHEN 10 MG/ML IV SOLN
INTRAVENOUS | Status: AC
  Filled 2016-01-09: qty 100

## 2016-01-09 MED ORDER — EPHEDRINE 5 MG/ML INJ
INTRAVENOUS | Status: AC
  Filled 2016-01-09: qty 10

## 2016-01-09 MED ORDER — CEFAZOLIN SODIUM-DEXTROSE 2-3 GM-% IV SOLR
2.0000 g | Freq: Once | INTRAVENOUS | Status: AC
  Administered 2016-01-09: 2 g via INTRAVENOUS

## 2016-01-09 MED ORDER — LABETALOL HCL 5 MG/ML IV SOLN
INTRAVENOUS | Status: AC
  Filled 2016-01-09: qty 4

## 2016-01-09 MED ORDER — PROPOFOL 10 MG/ML IV BOLUS
INTRAVENOUS | Status: DC | PRN
  Administered 2016-01-09: 100 mg via INTRAVENOUS
  Administered 2016-01-09: 200 mg via INTRAVENOUS

## 2016-01-09 MED ORDER — KCL IN DEXTROSE-NACL 20-5-0.45 MEQ/L-%-% IV SOLN
INTRAVENOUS | Status: AC
  Administered 2016-01-09: 1000 mL via INTRAVENOUS
  Filled 2016-01-09: qty 1000

## 2016-01-09 MED ORDER — NITROGLYCERIN 0.4 MG SL SUBL
0.4000 mg | SUBLINGUAL_TABLET | SUBLINGUAL | Status: DC | PRN
Start: 2016-01-09 — End: 2016-01-10

## 2016-01-09 MED ORDER — LACTATED RINGERS IV SOLN
INTRAVENOUS | Status: DC
  Administered 2016-01-09 (×2): via INTRAVENOUS

## 2016-01-09 MED ORDER — SUCCINYLCHOLINE CHLORIDE 20 MG/ML IJ SOLN
INTRAMUSCULAR | Status: DC | PRN
  Administered 2016-01-09: 100 mg via INTRAVENOUS
  Administered 2016-01-09: 60 mg via INTRAVENOUS

## 2016-01-09 MED ORDER — PROMETHAZINE HCL 25 MG/ML IJ SOLN: 6.2500 mg | INTRAMUSCULAR | Status: DC | PRN

## 2016-01-09 MED ORDER — DEXAMETHASONE SODIUM PHOSPHATE 10 MG/ML IJ SOLN
INTRAMUSCULAR | Status: DC | PRN
  Administered 2016-01-09: 10 mg via INTRAVENOUS

## 2016-01-09 MED ORDER — MIDAZOLAM HCL 5 MG/5ML IJ SOLN
INTRAMUSCULAR | Status: DC | PRN
  Administered 2016-01-09: 2 mg via INTRAVENOUS

## 2016-01-09 MED ORDER — GABAPENTIN 400 MG PO CAPS
400.0000 mg | ORAL_CAPSULE | Freq: Every day | ORAL | Status: DC
  Administered 2016-01-09: 400 mg via ORAL
  Filled 2016-01-09: qty 1

## 2016-01-09 MED ORDER — HYDROMORPHONE HCL 1 MG/ML IJ SOLN
1.0000 mg | INTRAMUSCULAR | Status: DC | PRN
  Administered 2016-01-09: 1 mg via INTRAVENOUS
  Filled 2016-01-09: qty 1

## 2016-01-09 MED ORDER — ROCURONIUM BROMIDE 100 MG/10ML IV SOLN
INTRAVENOUS | Status: AC
  Filled 2016-01-09: qty 1

## 2016-01-09 MED ORDER — MORPHINE SULFATE 15 MG PO TABS: 15.0000 mg | ORAL_TABLET | Freq: Three times a day (TID) | ORAL | Status: DC

## 2016-01-09 MED ORDER — ALPRAZOLAM 0.5 MG PO TABS: 0.5000 mg | ORAL_TABLET | Freq: Three times a day (TID) | ORAL | Status: DC | PRN

## 2016-01-09 MED ORDER — EZETIMIBE 10 MG PO TABS: 10.0000 mg | ORAL_TABLET | Freq: Every day | ORAL | Status: DC

## 2016-01-09 MED ORDER — IBUPROFEN 200 MG PO TABS: 600.0000 mg | ORAL_TABLET | Freq: Four times a day (QID) | ORAL | Status: DC | PRN

## 2016-01-09 MED ORDER — MORPHINE SULFATE ER 15 MG PO TBCR
15.0000 mg | EXTENDED_RELEASE_TABLET | Freq: Three times a day (TID) | ORAL | Status: DC
  Administered 2016-01-09 (×2): 15 mg via ORAL
  Filled 2016-01-09 (×2): qty 1

## 2016-01-09 MED ORDER — LUBIPROSTONE 24 MCG PO CAPS
24.0000 ug | ORAL_CAPSULE | Freq: Two times a day (BID) | ORAL | Status: DC
  Filled 2016-01-09 (×2): qty 1

## 2016-01-09 MED ORDER — FUROSEMIDE 20 MG PO TABS: 40.0000 mg | ORAL_TABLET | Freq: Every day | ORAL | Status: DC

## 2016-01-09 MED ORDER — SUFENTANIL CITRATE 50 MCG/ML IV SOLN
INTRAVENOUS | Status: AC
  Filled 2016-01-09: qty 1

## 2016-01-09 MED ORDER — METOPROLOL TARTRATE 25 MG PO TABS
25.0000 mg | ORAL_TABLET | Freq: Two times a day (BID) | ORAL | Status: DC
  Administered 2016-01-09: 25 mg via ORAL
  Filled 2016-01-09: qty 1

## 2016-01-09 MED ORDER — SODIUM CHLORIDE 0.9 % IJ SOLN
INTRAMUSCULAR | Status: AC
  Filled 2016-01-09: qty 10

## 2016-01-09 MED ORDER — SUFENTANIL CITRATE 50 MCG/ML IV SOLN
INTRAVENOUS | Status: DC | PRN
  Administered 2016-01-09: 10 ug via INTRAVENOUS
  Administered 2016-01-09: 5 ug via INTRAVENOUS
  Administered 2016-01-09: 10 ug via INTRAVENOUS
  Administered 2016-01-09: 15 ug via INTRAVENOUS
  Administered 2016-01-09 (×2): 10 ug via INTRAVENOUS

## 2016-01-09 MED ORDER — SUGAMMADEX SODIUM 200 MG/2ML IV SOLN
INTRAVENOUS | Status: AC
  Filled 2016-01-09: qty 2

## 2016-01-09 MED ORDER — CEFAZOLIN SODIUM-DEXTROSE 2-4 GM/100ML-% IV SOLN
INTRAVENOUS | Status: AC
  Filled 2016-01-09: qty 100

## 2016-01-09 MED ORDER — SODIUM CHLORIDE 0.9 % IJ SOLN
INTRAMUSCULAR | Status: AC
  Filled 2016-01-09: qty 20

## 2016-01-09 MED ORDER — ONDANSETRON 4 MG PO TBDP: 4.0000 mg | ORAL_TABLET | Freq: Four times a day (QID) | ORAL | Status: DC | PRN

## 2016-01-09 MED ORDER — FENTANYL CITRATE (PF) 100 MCG/2ML IJ SOLN
25.0000 ug | INTRAMUSCULAR | Status: DC | PRN
  Administered 2016-01-09 (×2): 50 ug via INTRAVENOUS

## 2016-01-09 MED ORDER — 0.9 % SODIUM CHLORIDE (POUR BTL) OPTIME
TOPICAL | Status: DC | PRN
  Administered 2016-01-09: 1000 mL

## 2016-01-09 MED ORDER — ACETAMINOPHEN 325 MG PO TABS
650.0000 mg | ORAL_TABLET | Freq: Four times a day (QID) | ORAL | Status: DC | PRN
Start: 2016-01-09 — End: 2016-01-10

## 2016-01-09 MED ORDER — ONDANSETRON HCL 4 MG/2ML IJ SOLN
INTRAMUSCULAR | Status: AC
  Filled 2016-01-09: qty 2

## 2016-01-09 MED ORDER — DEXAMETHASONE SODIUM PHOSPHATE 10 MG/ML IJ SOLN
INTRAMUSCULAR | Status: AC
  Filled 2016-01-09: qty 1

## 2016-01-09 MED ORDER — DULOXETINE HCL 60 MG PO CPEP: 90.0000 mg | ORAL_CAPSULE | Freq: Every day | ORAL | Status: DC

## 2016-01-09 MED ORDER — TEMAZEPAM 30 MG PO CAPS
30.0000 mg | ORAL_CAPSULE | Freq: Every day | ORAL | Status: DC
  Administered 2016-01-09: 30 mg via ORAL
  Filled 2016-01-09: qty 1

## 2016-01-09 MED ORDER — ONDANSETRON HCL 4 MG/2ML IJ SOLN
INTRAMUSCULAR | Status: DC | PRN
  Administered 2016-01-09: 4 mg via INTRAVENOUS

## 2016-01-09 MED ORDER — BACLOFEN 10 MG PO TABS: 10.0000 mg | ORAL_TABLET | Freq: Three times a day (TID) | ORAL | Status: DC | PRN

## 2016-01-09 MED ORDER — LABETALOL HCL 5 MG/ML IV SOLN
INTRAVENOUS | Status: DC | PRN
  Administered 2016-01-09: 2.5 mg via INTRAVENOUS

## 2016-01-09 MED ORDER — FENTANYL CITRATE (PF) 100 MCG/2ML IJ SOLN
INTRAMUSCULAR | Status: AC
  Administered 2016-01-09: 50 ug via INTRAVENOUS
  Filled 2016-01-09: qty 2

## 2016-01-09 MED ORDER — LIDOCAINE 2% (20 MG/ML) 5 ML SYRINGE
INTRAMUSCULAR | Status: AC
  Filled 2016-01-09: qty 5

## 2016-01-09 MED ORDER — KCL IN DEXTROSE-NACL 20-5-0.45 MEQ/L-%-% IV SOLN
INTRAVENOUS | Status: DC
  Administered 2016-01-09: 1000 mL via INTRAVENOUS
  Filled 2016-01-09 (×2): qty 1000

## 2016-01-09 MED ORDER — MIDAZOLAM HCL 2 MG/2ML IJ SOLN
INTRAMUSCULAR | Status: AC
  Filled 2016-01-09: qty 2

## 2016-01-09 MED ORDER — POTASSIUM CHLORIDE CRYS ER 20 MEQ PO TBCR
20.0000 meq | EXTENDED_RELEASE_TABLET | Freq: Two times a day (BID) | ORAL | Status: DC
  Administered 2016-01-09: 20 meq via ORAL
  Filled 2016-01-09: qty 1

## 2016-01-09 MED ORDER — ACETAMINOPHEN 650 MG RE SUPP: 650.0000 mg | Freq: Four times a day (QID) | RECTAL | Status: DC | PRN

## 2016-01-09 MED ORDER — ONDANSETRON HCL 4 MG/2ML IJ SOLN: 4.0000 mg | Freq: Four times a day (QID) | INTRAMUSCULAR | Status: DC | PRN

## 2016-01-09 MED ORDER — PROPOFOL 10 MG/ML IV BOLUS
INTRAVENOUS | Status: AC
  Filled 2016-01-09: qty 40

## 2016-01-09 MED ORDER — HYDROCODONE-ACETAMINOPHEN 5-325 MG PO TABS
1.0000 | ORAL_TABLET | ORAL | Status: DC | PRN
  Administered 2016-01-09 (×2): 1 via ORAL
  Filled 2016-01-09 (×2): qty 1

## 2016-01-09 SURGICAL SUPPLY — 38 items
APL SKNCLS STERI-STRIP NONHPOA (GAUZE/BANDAGES/DRESSINGS) ×1
ATTRACTOMAT 16X20 MAGNETIC DRP (DRAPES) ×2 IMPLANT
BENZOIN TINCTURE PRP APPL 2/3 (GAUZE/BANDAGES/DRESSINGS) ×2 IMPLANT
BLADE SURG 15 STRL LF DISP TIS (BLADE) ×1 IMPLANT
BLADE SURG 15 STRL SS (BLADE) ×2
CHLORAPREP W/TINT 26ML (MISCELLANEOUS) ×3 IMPLANT
CLIP TI MEDIUM 6 (CLIP) ×4 IMPLANT
CLIP TI WIDE RED SMALL 6 (CLIP) ×4 IMPLANT
COVER SURGICAL LIGHT HANDLE (MISCELLANEOUS) ×2 IMPLANT
DISSECTOR ROUND CHERRY 3/8 STR (MISCELLANEOUS) IMPLANT
DRAPE LAPAROTOMY T 98X78 PEDS (DRAPES) ×2 IMPLANT
DRESSING SURGICEL FIBRLLR 1X2 (HEMOSTASIS) ×1 IMPLANT
DRSG SURGICEL FIBRILLAR 1X2 (HEMOSTASIS) ×2
ELECT PENCIL ROCKER SW 15FT (MISCELLANEOUS) ×2 IMPLANT
ELECT REM PT RETURN 9FT ADLT (ELECTROSURGICAL) ×2
ELECTRODE REM PT RTRN 9FT ADLT (ELECTROSURGICAL) ×1 IMPLANT
GAUZE SPONGE 4X4 12PLY STRL (GAUZE/BANDAGES/DRESSINGS) ×1 IMPLANT
GAUZE SPONGE 4X4 16PLY XRAY LF (GAUZE/BANDAGES/DRESSINGS) ×2 IMPLANT
GLOVE SURG ORTHO 8.0 STRL STRW (GLOVE) ×2 IMPLANT
GOWN STRL REUS W/TWL XL LVL3 (GOWN DISPOSABLE) ×5 IMPLANT
ILLUMINATOR WAVEGUIDE N/F (MISCELLANEOUS) IMPLANT
KIT BASIN OR (CUSTOM PROCEDURE TRAY) ×2 IMPLANT
LIGHT WAVEGUIDE WIDE FLAT (MISCELLANEOUS) IMPLANT
PACK BASIC VI WITH GOWN DISP (CUSTOM PROCEDURE TRAY) ×2 IMPLANT
SHEARS HARMONIC 9CM CVD (BLADE) ×2 IMPLANT
STAPLER VISISTAT 35W (STAPLE) ×1 IMPLANT
STRIP CLOSURE SKIN 1/2X4 (GAUZE/BANDAGES/DRESSINGS) ×2 IMPLANT
SUT MNCRL AB 4-0 PS2 18 (SUTURE) ×2 IMPLANT
SUT SILK 2 0 (SUTURE) ×2
SUT SILK 2-0 18XBRD TIE 12 (SUTURE) ×1 IMPLANT
SUT SILK 3 0 (SUTURE)
SUT SILK 3-0 18XBRD TIE 12 (SUTURE) IMPLANT
SUT VIC AB 3-0 SH 18 (SUTURE) ×3 IMPLANT
SYR BULB IRRIGATION 50ML (SYRINGE) ×2 IMPLANT
TAPE CLOTH SURG 4X10 WHT LF (GAUZE/BANDAGES/DRESSINGS) ×1 IMPLANT
TOWEL OR 17X26 10 PK STRL BLUE (TOWEL DISPOSABLE) ×2 IMPLANT
TOWEL OR NON WOVEN STRL DISP B (DISPOSABLE) ×2 IMPLANT
YANKAUER SUCT BULB TIP 10FT TU (MISCELLANEOUS) ×2 IMPLANT

## 2016-01-09 NOTE — Addendum Note (Signed)
Addendum  created 01/09/16 1742 by Lissa Morales, CRNA   Anesthesia Intra Meds edited

## 2016-01-09 NOTE — Anesthesia Postprocedure Evaluation (Signed)
Anesthesia Post Note  Patient: [name]  Procedure(s) Performed: Procedure(s) (LRB): LEFT THYROID LOBECTOMY (Left)  Patient location during evaluation: PACU Anesthesia Type: General Level of consciousness: awake and alert Pain management: pain level controlled Vital Signs Assessment: post-procedure vital signs reviewed and stable Respiratory status: spontaneous breathing, nonlabored ventilation, respiratory function stable and patient connected to nasal cannula oxygen Cardiovascular status: blood pressure returned to baseline and stable Postop Assessment: no signs of nausea or vomiting Anesthetic complications: no    Last Vitals:  Vitals:   01/09/16 0734 01/09/16 1038  BP: 127/77 (!) 143/75  Pulse: 71 61  Resp: 16 15  Temp: 36.7 C 36.6 C    Last Pain:  Vitals:   01/09/16 0734  TempSrc: Oral                 Zenaida Deed

## 2016-01-09 NOTE — Transfer of Care (Signed)
Immediate Anesthesia Transfer of Care Note  Patient: [name]  Procedure(s) Performed: Procedure(s): LEFT THYROID LOBECTOMY (Left)  Patient Location: PACU  Anesthesia Type:General  Level of Consciousness: awake, alert , oriented and patient cooperative  Airway & Oxygen Therapy: Patient Spontanous Breathing and Patient connected to face mask oxygen  Post-op Assessment: Report given to RN, Post -op Vital signs reviewed and stable and Patient moving all extremities X 4  Post vital signs: stable  Last Vitals:  Vitals:   01/09/16 0734  BP: 127/77  Pulse: 71  Resp: 16  Temp: 36.7 C    Last Pain:  Vitals:   01/09/16 0734  TempSrc: Oral         Complications: No apparent anesthesia complications

## 2016-01-09 NOTE — Interval H&P Note (Signed)
History and Physical Interval Note:  01/09/2016 8:57 AM  [name]  has presented today for surgery, with the diagnosis of Left thyroid nodule.  The various methods of treatment have been discussed with the patient and family. After consideration of risks, benefits and other options for treatment, the patient has consented to   Procedure(s): LEFT THYROID LOBECTOMY (Left) as a surgical intervention .    The patient's history has been reviewed, patient examined, no change in status, stable for surgery.  I have reviewed the patient's chart and labs.  Questions were answered to the patient's satisfaction.    Earnstine Regal, MD, [facility] Office: [place]

## 2016-01-09 NOTE — Anesthesia Procedure Notes (Addendum)
Procedure Name: Intubation Date/Time: 01/09/2016 9:24 AM Performed by: Lissa Morales Pre-anesthesia Checklist: Patient identified, Emergency Drugs available, Suction available and Patient being monitored Patient Re-evaluated:Patient Re-evaluated prior to inductionOxygen Delivery Method: Circle system utilized Preoxygenation: Pre-oxygenation with 100% oxygen Intubation Type: IV induction Ventilation: Mask ventilation without difficulty Laryngoscope Size: Glidescope, Mac and 3 Grade View: Grade III Tube type: Oral Tube size: 7.0 mm Number of attempts: 1 Airway Equipment and Method: Stylet and Oral airway Placement Confirmation: ETT inserted through vocal cords under direct vision,  positive ETCO2 and breath sounds checked- equal and bilateral Secured at: 22 cm Tube secured with: Tape Dental Injury: Teeth and Oropharynx as per pre-operative assessment  Difficulty Due To: Difficulty was anticipated, Difficult Airway- due to anterior larynx, Difficult Airway- due to reduced neck mobility, Difficult Airway- due to limited oral opening and Difficult Airway- due to dentition Comments:   Elective glidescope CRNA attempted x2 with glidescope . Dr. Jillyn Hidden intubated easily with Glidescope 3 blade,repositioned blade to visualize  Cords better.

## 2016-01-09 NOTE — Op Note (Signed)
Procedure Note  Pre-operative Diagnosis:  Left thyroid neoplasm of uncertain behavior   Post-operative Diagnosis:  same  Surgeon:  Earnstine Regal, MD, FACS  Assistant:  none   Procedure:  Left thyroid lobectomy  Anesthesia:  General  Estimated Blood Loss:  minimal  Drains: none         Specimen: thyroid lobe to pathology  Indications:  The patient is a 60 year old female who presents with a thyroid nodule. Patient returns following fine-needle aspiration biopsy of the dominant mass in the left thyroid lobe. Cytopathology shows this to be benign, Bethesda category II. Patient may have some mild compressive symptoms. She has occasional discomfort. She has discussed this with her brother-in-law who is a Psychologist, sport and exercise. He has recommended surgical resection. Patient is in agreement. We discussed the risk and benefits of the surgery today. She would like to proceed with left thyroid lobectomy.   Procedure Details: Procedure was done in OR #4 at the [facility].  The patient was brought to the operating room and placed in a supine position on the operating room table.  Following administration of general anesthesia, the patient was positioned and then prepped and draped in the usual aseptic fashion.  After ascertaining that an adequate level of anesthesia had been achieved, a Kocher incision was made with #15 blade.  Dissection was carried through subcutaneous tissues and platysma. Hemostasis was achieved with the electrocautery.  Skin flaps were elevated cephalad and caudad from the thyroid notch to the sternal notch.  A self-retaining retractor was placed for exposure.  Strap muscles were incised in the midline and dissection was begun on the left side.  Strap muscles were reflected laterally.  The left thyroid lobe was markedly enlarged extending beneath the left clavicle.  The lobe was gently mobilized with blunt dissection.  Superior pole vessels were dissected out and divided  individually between small and medium Ligaclips with the Harmonic scalpel.  The thyroid lobe was rolled anteriorly.  Branches of the inferior thyroid artery were divided between small Ligaclips with the Harmonic scalpel.  Inferior venous tributaries were divided between Ligaclips.  Both the superior and inferior parathyroid glands were identified and preserved on their vascular pedicles.  The recurrent laryngeal nerve was identified and preserved along its course.  The ligament of Gwenlyn Found was released with the electrocautery and the gland was mobilized onto the anterior trachea. Isthmus was mobilized across the midline.  There was a moderate sized pyramidal lobe present which was resected with the thyroid isthmus.  The thyroid parenchyma was transected at the junction of the isthmus and contralateral thyroid lobe with the Harmonic scalpel.  The thyroid lobe and isthmus were submitted to pathology for review.  The neck was irrigated with warm saline.  Fibular was placed throughout the operative field.  Strap muscles were reapproximated in the midline with interrupted 3-0 Vicryl sutures.  Platysma was closed with interrupted 3-0 Vicryl sutures.  Skin was closed with a running 4-0 Monocryl subcuticular suture.  Wound was washed and dried and benzoin and steri-strips were applied.  Dry gauze dressing was placed.  The patient was awakened from anesthesia and brought to the recovery room.  The patient tolerated the procedure well.   Earnstine Regal, MD, [facility] Office: [phone]

## 2016-01-10 NOTE — Progress Notes (Signed)
Discharge instructions discussed with patient until no further questions ask.  IV discontinued. Neck dressing replaced. Ice to neck.  Ice pack and 1 pack of 4x4s with tape given for home use.

## 2016-01-10 NOTE — Discharge Summary (Signed)
Physician Discharge Summary [facility]  Patient ID: [name] MRN: [id number] DOB/AGE: 09/30/1955 60 y.o.  Admit date: 01/09/2016 Discharge date: 01/10/2016  Admission Diagnoses:  Left thyroid nodule of undetermined significance  Discharge Diagnoses:  Principal Problem:   Neoplasm of uncertain behavior of thyroid gland, left lobe   Discharged Condition: good  Hospital Course: Patient was admitted for observation following thyroid surgery.  Post op course was uncomplicated.  Pain was well controlled.  Tolerated diet.  Patient was prepared for discharge home on POD#1.  Consults: None  Treatments: surgery: left thyroid lobectomy  Discharge Exam: Blood pressure 132/72, pulse 64, temperature 98.1 F (36.7 C), temperature source Oral, resp. rate 18, height 5\' 7"  (1.702 m), weight 69.9 kg (154 lb), SpO2 99 %. HEENT - clear Neck - wound dry and intact; minimal STS; mild hoarseness Chest - clear bilaterally Cor - RRR  Disposition: Home  Discharge Instructions    Apply dressing    Complete by:  As directed   Apply light gauze dressing to neck incision prior to discharge home today.   Diet - low sodium heart healthy    Complete by:  As directed   Discharge instructions    Complete by:  As directed   [facility]  THYROID & PARATHYROID SURGERY:  POST-OP INSTRUCTIONS  Always review your discharge instruction sheet from the facility where your surgery was performed.  A prescription for pain medication may be given to you upon discharge.  Take your pain medication as prescribed.  If narcotic pain medicine is not needed, then you may take acetaminophen (Tylenol) or ibuprofen (Advil) as needed.  Take your usually prescribed medications unless otherwise directed.  If you need a refill on your pain medication, please contact your pharmacy. They will contact our office to request authorization.  Prescriptions will not be processed by our office after  5 pm or on weekends.  Start with a light diet upon arrival home, such as soup and crackers or toast.  Be sure to drink plenty of fluids daily.  Resume your normal diet the day after surgery.  Most patients will experience some swelling and bruising on the chest and neck area.  Ice packs will help.  Swelling and bruising can take several days to resolve.   It is common to experience some constipation after surgery.  Increasing fluid intake and taking a stool softener will usually help or prevent this problem.  A mild laxative (Milk of Magnesia or Miralax) should be taken according to package directions if there has been no bowel movement after 48 hours.  You have steri-strips and a gauze dressing over your incision.  You may remove the gauze bandage on the second day after surgery, and you may shower at that time.  Leave your steri-strips (small skin tapes) in place directly over the incision.  These strips should remain on the skin for 5-7 days and then be removed.  You may get them wet in the shower and pat them dry.  You may resume regular (light) daily activities beginning the next day - such as daily self-care, walking, climbing stairs - gradually increasing activities as tolerated.  You may have sexual intercourse when it is comfortable.  Refrain from any heavy lifting or straining until approved by your doctor.  You may drive when you no longer are taking prescription pain medication, you can comfortably wear a seatbelt, and you can safely maneuver your car and apply brakes.  You should  see your doctor in the office for a follow-up appointment approximately two to three weeks after your surgery.  Make sure that you call for this appointment within a day or two after you arrive home to insure a convenient appointment time.  WHEN TO CALL YOUR DOCTOR: -- Fever greater than 101.5 -- Inability to urinate -- Nausea and/or vomiting - persistent -- Extreme swelling or bruising -- Continued bleeding  from incision -- Increased pain, redness, or drainage from the incision -- Difficulty swallowing or breathing -- Muscle cramping or spasms -- Numbness or tingling in hands or around lips  The clinic staff is available to answer your questions during regular business hours.  Please don't hesitate to call and ask to speak to one of the nurses if you have concerns.  Earnstine Regal, MD, [facility] Office: [phone]  Website: [email or web address]   Increase activity slowly    Complete by:  As directed   Remove dressing in 24 hours    Complete by:  As directed       Medication List    TAKE these medications   ALPRAZolam 0.5 MG tablet Commonly known as:  XANAX Take 0.5 mg by mouth 3 (three) times daily as needed for anxiety.   aspirin 81 MG tablet Take 81 mg by mouth daily.   atorvastatin 80 MG tablet Commonly known as:  LIPITOR TAKE 1 TABLET (80 MG TOTAL) BY MOUTH DAILY.   baclofen 10 MG tablet Commonly known as:  LIORESAL Take 10 mg by mouth 3 (three) times daily as needed for muscle spasms.   docusate sodium 100 MG capsule Commonly known as:  COLACE Take 300 mg by mouth daily as needed for moderate constipation.   DULoxetine 30 MG capsule Commonly known as:  CYMBALTA Take 90 mg by mouth daily. Take 3 a day for a total of 90mg    ezetimibe 10 MG tablet Commonly known as:  ZETIA Take 1 tablet (10 mg total) by mouth daily.   furosemide 40 MG tablet Commonly known as:  LASIX TAKE ONE TABLET BY MOUTH ONCE DAILY   gabapentin 400 MG capsule Commonly known as:  NEURONTIN Take 400 mg by mouth at bedtime.   hydrocortisone 2.5 % rectal cream Commonly known as:  ANUSOL-HC Place 1 application rectally 2 (two) times daily as needed for hemorrhoids or itching.   ibuprofen 600 MG tablet Commonly known as:  ADVIL,MOTRIN Take 600 mg by mouth every 6 (six) hours as needed.   KLOR-CON M20 20 MEQ tablet Generic  drug:  potassium chloride SA TAKE ONE TABLET BY MOUTH TWICE DAILY   lubiprostone 24 MCG capsule Commonly known as:  AMITIZA Take 1 capsule (24 mcg total) by mouth 2 (two) times daily with a meal.   metoprolol tartrate 25 MG tablet Commonly known as:  LOPRESSOR Take 1 tablet (25 mg total) by mouth 2 (two) times daily.   morphine 15 MG 12 hr tablet Commonly known as:  MS CONTIN Take 15 mg by mouth 3 (three) times daily.   nitroGLYCERIN 0.4 MG SL tablet Commonly known as:  NITROSTAT Place 1 tablet (0.4 mg total) under the tongue every 5 (five) minutes as needed for chest pain.   Potassium Chloride ER 20 MEQ Tbcr Take 20 mEq by mouth 2 (two) times daily.   PROAIR HFA 108 (90 Base) MCG/ACT inhaler Generic drug:  albuterol Inhale 2 puffs into the lungs as needed for wheezing or shortness of breath. For wheezing  temazepam 30 MG capsule Commonly known as:  RESTORIL Take 30 mg by mouth at bedtime.   warfarin 5 MG tablet Commonly known as:  COUMADIN USE AS DIRECTED        Earnstine Regal, MD, [facility] Office: [phone]   Signed: Earnstine Regal 01/10/2016, 7:56 AM

## 2016-01-10 NOTE — Care Management Obs Status (Signed)
[place] NOTIFICATION   Patient Details  Name: [name] MRN: [id number] Date of Birth: April 05, 1956   Medicare Observation Status Notification Given:  Yes    CrutchfieldAntony Haste, RN 01/10/2016, 11:08 AM

## 2016-01-11 NOTE — Progress Notes (Signed)
Please contact patient and notify of benign pathology results.  Brittny Spangle M. Fadil Macmaster, MD, FACS [facility] Office: [phone]   

## 2016-01-12 ENCOUNTER — Other Ambulatory Visit: Payer: Self-pay | Admitting: Cardiology

## 2016-01-13 ENCOUNTER — Other Ambulatory Visit: Payer: Self-pay

## 2016-01-17 ENCOUNTER — Ambulatory Visit (INDEPENDENT_AMBULATORY_CARE_PROVIDER_SITE_OTHER): Payer: Medicare HMO | Admitting: Pharmacist

## 2016-01-17 DIAGNOSIS — Z5181 Encounter for therapeutic drug level monitoring: Secondary | ICD-10-CM

## 2016-01-17 LAB — POCT INR: INR: 1.9

## 2016-02-04 DIAGNOSIS — E041 Nontoxic single thyroid nodule: Secondary | ICD-10-CM | POA: Diagnosis not present

## 2016-02-05 ENCOUNTER — Encounter (INDEPENDENT_AMBULATORY_CARE_PROVIDER_SITE_OTHER): Payer: Self-pay

## 2016-02-05 ENCOUNTER — Ambulatory Visit (INDEPENDENT_AMBULATORY_CARE_PROVIDER_SITE_OTHER): Payer: Medicare HMO | Admitting: Cardiology

## 2016-02-05 ENCOUNTER — Encounter: Payer: Self-pay | Admitting: Cardiology

## 2016-02-05 VITALS — BP 104/56 | HR 87 | Ht 67.0 in | Wt 157.0 lb

## 2016-02-05 DIAGNOSIS — I480 Paroxysmal atrial fibrillation: Secondary | ICD-10-CM | POA: Diagnosis not present

## 2016-02-05 DIAGNOSIS — I10 Essential (primary) hypertension: Secondary | ICD-10-CM

## 2016-02-05 DIAGNOSIS — E785 Hyperlipidemia, unspecified: Secondary | ICD-10-CM

## 2016-02-05 DIAGNOSIS — Z23 Encounter for immunization: Secondary | ICD-10-CM

## 2016-02-05 DIAGNOSIS — I2510 Atherosclerotic heart disease of native coronary artery without angina pectoris: Secondary | ICD-10-CM

## 2016-02-05 NOTE — Addendum Note (Signed)
Addended by: Harland German A on: 02/05/2016 05:41 PM   Modules accepted: Orders

## 2016-02-05 NOTE — Progress Notes (Signed)
Cardiology Office Note    Date:  02/05/2016   ID:  [name], [name] May 01, 1956, MRN [id number]  PCP:  Marjorie Smolder, MD  Cardiologist:  Fransico Him, MD   Chief Complaint  Patient presents with  . Atrial Fibrillation  . Coronary Artery Disease  . Hyperlipidemia  . Hypertension    History of Present Illness:  [name] is a 60 y.o. female with a hx of CAD s/p NSTEMI tx with DES x 2 LAD in 2013, ICM, HTN, HL. EF improved to 50% by nuclear study in 2014 and this study demonstrated no ischemia. She has a history of mild OSA with AHI 11.8 events per hour.It was recommended that she get set up for a CPAP titration but she did not want to proceed.She has a history of atrial fibrillation with RVR and was started on NOAC for a CHADS2VASC score of 4 but changed to Coumadin due to cost.   She recently underwent thyroid mass resection and the first few days post op she was not feeling well and started with palpitations and decided to go to the ER.  She got in the car to go and things started to calm down and she never went.  She has not had any further palpitations.  She denies any chest pain or SOB, dizziness,  or syncope. Occasionally she will have some mild LE edema which is stable and only occurs if she does not take her Lasix.     Past Medical History:  Diagnosis Date  . Anxiety   . CAD (coronary artery disease), native coronary artery    s/p NSTEMI tx with DES x 2 LAD in 2013  . 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 Chronic pain    "back and legs"  . Degenerative joint disease    cervical disc pain wears  Fentanyl Transdermal patch  . Depression   . Dysrhythmia   . External hemorrhoids   . Hepatitis    Hepatitis A  . History of bronchitis   . History of urinary tract infection   . History of weight loss    11-22-13 100 pounds lost over past year ;as intentional weight loss.  . Hyperlipemia   . Hypertension   . Migraine headache   .  Myocardial infarction 06/29/11  . PAF (paroxysmal atrial fibrillation) ([place]) 02/19/2015  . Pneumonia   . Sepsis ([place])   . Sleep apnea    declined CPAP    Past Surgical History:  Procedure Laterality Date  . ABDOMINAL HYSTERECTOMY    . ABDOMINAL SURGERY     removal of benign pelvic tumore  . BREAST SURGERY     bilat breast reduction   . COLONOSCOPY WITH PROPOFOL N/A 11/30/2013   Procedure: COLONOSCOPY WITH PROPOFOL;  Surgeon: Juanita Craver, MD;  Location: [facility] ENDOSCOPY;  Service: Endoscopy;  Laterality: N/A;  . CORONARY ANGIOPLASTY WITH STENT PLACEMENT    . JOINT REPLACEMENT     bilateral knee replacements  . KNEE SURGERY     "multiple knee scopes and OR bilaterally; final outcome bilateral knee replacements"  . LEFT HEART CATHETERIZATION WITH CORONARY ANGIOGRAM N/A 06/30/2011   Procedure: LEFT HEART CATHETERIZATION WITH CORONARY ANGIOGRAM;  Surgeon: Jettie Booze, MD;  Location: [facility] CATH LAB;  Service: Cardiovascular;  Laterality: N/A;  . PERCUTANEOUS CORONARY STENT INTERVENTION (PCI-S)  06/30/2011   Procedure: PERCUTANEOUS CORONARY STENT INTERVENTION (PCI-S);  Surgeon: Jettie Booze, MD;  Location: [facility]  CATH LAB;  Service: Cardiovascular;;  . REPLACEMENT TOTAL KNEE    . THYROID LOBECTOMY Left 01/09/2016   Procedure: LEFT THYROID LOBECTOMY;  Surgeon: Armandina Gemma, MD;  Location: [facility] ORS;  Service: General;  Laterality: Left;  . TMJ ARTHROPLASTY    . TONSILLECTOMY AND ADENOIDECTOMY      Current Medications: Outpatient Medications Prior to Visit  Medication Sig Dispense Refill  . ALPRAZolam (XANAX) 0.5 MG tablet Take 0.5 mg by mouth 3 (three) times daily as needed for anxiety.     Marland Kitchen aspirin 81 MG tablet Take 81 mg by mouth daily.    Marland Kitchen atorvastatin (LIPITOR) 80 MG tablet TAKE 1 TABLET (80 MG TOTAL) BY MOUTH DAILY. 90 tablet 2  . baclofen (LIORESAL) 10 MG tablet Take 10 mg by mouth 3 (three) times daily as needed for muscle spasms.     Marland Kitchen docusate sodium (COLACE) 100 MG capsule Take  300 mg by mouth daily as needed for moderate constipation.     . DULoxetine (CYMBALTA) 30 MG capsule Take 90 mg by mouth daily. Take 3 a day for a total of 90mg      . ezetimibe (ZETIA) 10 MG tablet Take 1 tablet (10 mg total) by mouth daily. 90 tablet 0  . furosemide (LASIX) 40 MG tablet TAKE ONE TABLET BY MOUTH ONCE DAILY 30 tablet 7  . gabapentin (NEURONTIN) 400 MG capsule Take 400 mg by mouth at bedtime.    Marland Kitchen ibuprofen (ADVIL,MOTRIN) 600 MG tablet Take 600 mg by mouth every 6 (six) hours as needed.    Marland Kitchen KLOR-CON M20 20 MEQ tablet TAKE ONE TABLET BY MOUTH TWICE DAILY 60 tablet 10  . lubiprostone (AMITIZA) 24 MCG capsule Take 1 capsule (24 mcg total) by mouth 2 (two) times daily with a meal. 60 capsule 3  . metoprolol tartrate (LOPRESSOR) 25 MG tablet Take 1 tablet (25 mg total) by mouth 2 (two) times daily. 180 tablet 2  . morphine (MS CONTIN) 15 MG 12 hr tablet Take 15 mg by mouth 3 (three) times daily.    . nitroGLYCERIN (NITROSTAT) 0.4 MG SL tablet Place 1 tablet (0.4 mg total) under the tongue every 5 (five) minutes as needed for chest pain. 25 tablet 3  . Potassium Chloride ER 20 MEQ TBCR Take 20 mEq by mouth 2 (two) times daily. 60 tablet 11  . PROAIR HFA 108 (90 BASE) MCG/ACT inhaler Inhale 2 puffs into the lungs as needed for wheezing or shortness of breath. For wheezing  0  . temazepam (RESTORIL) 30 MG capsule Take 30 mg by mouth at bedtime.     Marland Kitchen warfarin (COUMADIN) 5 MG tablet TAKE AS DIRECTED 60 tablet 1  . hydrocortisone (ANUSOL-HC) 2.5 % rectal cream Place 1 application rectally 2 (two) times daily as needed for hemorrhoids or itching. (Patient not taking: Reported on 02/05/2016) 30 g 2   No facility-administered medications prior to visit.      Allergies:   Review of patient's allergies indicates no known allergies.   Social History   Social History  . Marital status: Single    Spouse name: N/A  . Number of children: 0  . Years of education: N/A   Social History Main  Topics  . Smoking status: Heavy Tobacco Smoker    Packs/day: 1.00    Years: 30.00    Types: Cigarettes, E-cigarettes    Start date: 01/29/2014  . Smokeless tobacco: Never Used     Comment: smoking consult entered 06/30/11 1503. / 11-22-13 states  about 6 cigarettes per day  . Alcohol use 0.0 oz/week     Comment: rare  . Drug use: No  . Sexual activity: Not Currently   Other Topics Concern  . None   Social History Narrative  . None     Family History:  The patient's family history includes Heart attack in her mother; Heart failure in her mother; Rectal cancer in her father.   ROS:   Please see the history of present illness.    ROS All other systems reviewed and are negative.  No flowsheet data found.     PHYSICAL EXAM:   VS:  BP (!) 104/56   Pulse 87   Ht 5\' 7"  (1.702 m)   Wt 157 lb (71.2 kg)   SpO2 95%   BMI 24.59 kg/m    GEN: Well nourished, well developed, in no acute distress  HEENT: normal  Neck: no JVD, carotid bruits, or masses Cardiac: RRR; no murmurs, rubs, or gallops,no edema.  Intact distal pulses bilaterally.  Respiratory:  clear to auscultation bilaterally, normal work of breathing GI: soft, nontender, nondistended, + BS MS: no deformity or atrophy  Skin: warm and dry, no rash Neuro:  Alert and Oriented x 3, Strength and sensation are intact Psych: euthymic mood, full affect  Wt Readings from Last 3 Encounters:  02/05/16 157 lb (71.2 kg)  01/09/16 154 lb (69.9 kg)  01/08/16 154 lb (69.9 kg)      Studies/Labs Reviewed:   EKG:  EKG is not  ordered today.    Recent Labs: 01/08/2016: ALT 38; BUN 18; Creatinine, Ser 0.71; Hemoglobin 14.6; Platelets 238; Potassium 4.0; Sodium 138   Lipid Panel    Component Value Date/Time   CHOL 99 10/10/2014 0340   TRIG 81 10/10/2014 0340   HDL 39 (L) 10/10/2014 0340   CHOLHDL 2.5 10/10/2014 0340   VLDL 16 10/10/2014 0340   LDLCALC 44 10/10/2014 0340    Additional studies/ records that were reviewed today  include:  none    ASSESSMENT:    1. PAF (paroxysmal atrial fibrillation) ([place])   2. Essential hypertension   3. Atherosclerosis of native coronary artery of native heart without angina pectoris   4. Dyslipidemia      PLAN:  In order of problems listed above:  1. PAF - maintaining NSR.  Continue BB and warfarin. 2. HTN - BP controlled on current meds.  Continue BB 3. ASCAD with remote NSTEMI with DES to LAD x 2.  She has not had any angina.  Continue BB and statin.   4. Hyperlipidemia with LDL goal < 70. Continue statin/zetia.  Check FLP and ALT.      Medication Adjustments/Labs and Tests Ordered: Current medicines are reviewed at length with the patient today.  Concerns regarding medicines are outlined above.  Medication changes, Labs and Tests ordered today are listed in the Patient Instructions below.  There are no Patient Instructions on file for this visit.   Signed, Fransico Him, MD  02/05/2016 2:59 PM    [address], [place], [place]  [address] Phone: [phone]; Fax: [phone]

## 2016-02-07 ENCOUNTER — Other Ambulatory Visit: Payer: Medicare HMO

## 2016-02-14 ENCOUNTER — Ambulatory Visit (INDEPENDENT_AMBULATORY_CARE_PROVIDER_SITE_OTHER): Payer: Medicare HMO | Admitting: *Deleted

## 2016-02-14 ENCOUNTER — Other Ambulatory Visit: Payer: Medicare HMO | Admitting: *Deleted

## 2016-02-14 DIAGNOSIS — E785 Hyperlipidemia, unspecified: Secondary | ICD-10-CM | POA: Diagnosis not present

## 2016-02-14 DIAGNOSIS — I4891 Unspecified atrial fibrillation: Secondary | ICD-10-CM | POA: Diagnosis not present

## 2016-02-14 DIAGNOSIS — Z5181 Encounter for therapeutic drug level monitoring: Secondary | ICD-10-CM

## 2016-02-14 LAB — LIPID PANEL
Cholesterol: 135 mg/dL (ref 125–200)
HDL: 54 mg/dL
LDL Cholesterol: 69 mg/dL
Total CHOL/HDL Ratio: 2.5 ratio
Triglycerides: 61 mg/dL
VLDL: 12 mg/dL

## 2016-02-14 LAB — HEPATIC FUNCTION PANEL
ALT: 27 U/L (ref 6–29)
AST: 19 U/L (ref 10–35)
Albumin: 3.7 g/dL (ref 3.6–5.1)
Alkaline Phosphatase: 54 U/L (ref 33–130)
Bilirubin, Direct: 0.1 mg/dL
Indirect Bilirubin: 0.3 mg/dL (ref 0.2–1.2)
Total Bilirubin: 0.4 mg/dL (ref 0.2–1.2)
Total Protein: 5.6 g/dL — ABNORMAL LOW (ref 6.1–8.1)

## 2016-02-14 LAB — POCT INR: INR: 1.8

## 2016-03-06 ENCOUNTER — Ambulatory Visit (INDEPENDENT_AMBULATORY_CARE_PROVIDER_SITE_OTHER): Payer: Medicare HMO | Admitting: *Deleted

## 2016-03-06 DIAGNOSIS — Z5181 Encounter for therapeutic drug level monitoring: Secondary | ICD-10-CM

## 2016-03-06 DIAGNOSIS — I4891 Unspecified atrial fibrillation: Secondary | ICD-10-CM | POA: Diagnosis not present

## 2016-03-06 LAB — POCT INR: INR: 2.2

## 2016-03-16 DIAGNOSIS — M50323 Other cervical disc degeneration at C6-C7 level: Secondary | ICD-10-CM | POA: Diagnosis not present

## 2016-03-16 DIAGNOSIS — Z79891 Long term (current) use of opiate analgesic: Secondary | ICD-10-CM | POA: Diagnosis not present

## 2016-03-16 DIAGNOSIS — M5136 Other intervertebral disc degeneration, lumbar region: Secondary | ICD-10-CM | POA: Diagnosis not present

## 2016-03-16 DIAGNOSIS — G894 Chronic pain syndrome: Secondary | ICD-10-CM | POA: Diagnosis not present

## 2016-03-22 ENCOUNTER — Other Ambulatory Visit: Payer: Self-pay | Admitting: Cardiology

## 2016-04-03 DIAGNOSIS — E329 Disease of thymus, unspecified: Secondary | ICD-10-CM | POA: Diagnosis not present

## 2016-04-03 DIAGNOSIS — E032 Hypothyroidism due to medicaments and other exogenous substances: Secondary | ICD-10-CM | POA: Diagnosis not present

## 2016-04-06 ENCOUNTER — Other Ambulatory Visit: Payer: Self-pay | Admitting: Internal Medicine

## 2016-04-06 DIAGNOSIS — D485 Neoplasm of uncertain behavior of skin: Secondary | ICD-10-CM | POA: Diagnosis not present

## 2016-04-06 DIAGNOSIS — Z23 Encounter for immunization: Secondary | ICD-10-CM | POA: Diagnosis not present

## 2016-04-06 DIAGNOSIS — L820 Inflamed seborrheic keratosis: Secondary | ICD-10-CM | POA: Diagnosis not present

## 2016-04-09 DIAGNOSIS — R69 Illness, unspecified: Secondary | ICD-10-CM | POA: Diagnosis not present

## 2016-04-10 ENCOUNTER — Ambulatory Visit (INDEPENDENT_AMBULATORY_CARE_PROVIDER_SITE_OTHER): Payer: Medicare HMO

## 2016-04-10 DIAGNOSIS — I4891 Unspecified atrial fibrillation: Secondary | ICD-10-CM | POA: Diagnosis not present

## 2016-04-10 DIAGNOSIS — Z5181 Encounter for therapeutic drug level monitoring: Secondary | ICD-10-CM

## 2016-04-10 LAB — POCT INR: INR: 1.8

## 2016-04-25 ENCOUNTER — Other Ambulatory Visit: Payer: Self-pay | Admitting: Cardiology

## 2016-04-29 DIAGNOSIS — R69 Illness, unspecified: Secondary | ICD-10-CM | POA: Diagnosis not present

## 2016-05-01 ENCOUNTER — Ambulatory Visit (INDEPENDENT_AMBULATORY_CARE_PROVIDER_SITE_OTHER): Payer: Medicare HMO

## 2016-05-01 DIAGNOSIS — I4891 Unspecified atrial fibrillation: Secondary | ICD-10-CM | POA: Diagnosis not present

## 2016-05-01 DIAGNOSIS — Z5181 Encounter for therapeutic drug level monitoring: Secondary | ICD-10-CM | POA: Diagnosis not present

## 2016-05-01 LAB — POCT INR: INR: 2.3

## 2016-05-05 ENCOUNTER — Other Ambulatory Visit: Payer: Self-pay | Admitting: Cardiology

## 2016-05-25 DIAGNOSIS — M50323 Other cervical disc degeneration at C6-C7 level: Secondary | ICD-10-CM | POA: Diagnosis not present

## 2016-05-25 DIAGNOSIS — G894 Chronic pain syndrome: Secondary | ICD-10-CM | POA: Diagnosis not present

## 2016-05-25 DIAGNOSIS — M5136 Other intervertebral disc degeneration, lumbar region: Secondary | ICD-10-CM | POA: Diagnosis not present

## 2016-05-25 DIAGNOSIS — Z79891 Long term (current) use of opiate analgesic: Secondary | ICD-10-CM | POA: Diagnosis not present

## 2016-05-28 DIAGNOSIS — Z720 Tobacco use: Secondary | ICD-10-CM | POA: Diagnosis not present

## 2016-05-28 DIAGNOSIS — E039 Hypothyroidism, unspecified: Secondary | ICD-10-CM | POA: Diagnosis not present

## 2016-05-29 DIAGNOSIS — R69 Illness, unspecified: Secondary | ICD-10-CM | POA: Diagnosis not present

## 2016-07-09 DIAGNOSIS — I480 Paroxysmal atrial fibrillation: Secondary | ICD-10-CM | POA: Diagnosis not present

## 2016-07-20 DIAGNOSIS — I480 Paroxysmal atrial fibrillation: Secondary | ICD-10-CM | POA: Diagnosis not present

## 2016-08-10 DIAGNOSIS — I480 Paroxysmal atrial fibrillation: Secondary | ICD-10-CM | POA: Diagnosis not present

## 2016-08-21 DIAGNOSIS — S9032XA Contusion of left foot, initial encounter: Secondary | ICD-10-CM | POA: Diagnosis not present

## 2016-08-21 DIAGNOSIS — Z23 Encounter for immunization: Secondary | ICD-10-CM | POA: Diagnosis not present

## 2016-09-07 DIAGNOSIS — S8012XA Contusion of left lower leg, initial encounter: Secondary | ICD-10-CM | POA: Diagnosis not present

## 2016-09-07 DIAGNOSIS — L814 Other melanin hyperpigmentation: Secondary | ICD-10-CM | POA: Diagnosis not present

## 2016-09-07 DIAGNOSIS — L821 Other seborrheic keratosis: Secondary | ICD-10-CM | POA: Diagnosis not present

## 2016-09-07 DIAGNOSIS — D225 Melanocytic nevi of trunk: Secondary | ICD-10-CM | POA: Diagnosis not present

## 2016-09-07 DIAGNOSIS — L570 Actinic keratosis: Secondary | ICD-10-CM | POA: Diagnosis not present

## 2016-09-07 DIAGNOSIS — Z85828 Personal history of other malignant neoplasm of skin: Secondary | ICD-10-CM | POA: Diagnosis not present

## 2016-09-07 DIAGNOSIS — D1800 Hemangioma unspecified site: Secondary | ICD-10-CM | POA: Diagnosis not present

## 2016-09-09 ENCOUNTER — Ambulatory Visit (INDEPENDENT_AMBULATORY_CARE_PROVIDER_SITE_OTHER): Payer: Medicare HMO | Admitting: Psychology

## 2016-09-09 DIAGNOSIS — R69 Illness, unspecified: Secondary | ICD-10-CM | POA: Diagnosis not present

## 2016-09-09 DIAGNOSIS — F321 Major depressive disorder, single episode, moderate: Secondary | ICD-10-CM | POA: Diagnosis not present

## 2016-09-14 DIAGNOSIS — T148XXA Other injury of unspecified body region, initial encounter: Secondary | ICD-10-CM | POA: Diagnosis not present

## 2016-09-14 DIAGNOSIS — E039 Hypothyroidism, unspecified: Secondary | ICD-10-CM | POA: Diagnosis not present

## 2016-09-18 ENCOUNTER — Ambulatory Visit (INDEPENDENT_AMBULATORY_CARE_PROVIDER_SITE_OTHER): Payer: Medicare HMO | Admitting: *Deleted

## 2016-09-18 DIAGNOSIS — Z5181 Encounter for therapeutic drug level monitoring: Secondary | ICD-10-CM | POA: Diagnosis not present

## 2016-09-18 DIAGNOSIS — I4891 Unspecified atrial fibrillation: Secondary | ICD-10-CM | POA: Diagnosis not present

## 2016-09-18 LAB — POCT INR: INR: 2.3

## 2016-09-22 ENCOUNTER — Other Ambulatory Visit: Payer: Self-pay | Admitting: Cardiology

## 2016-09-24 DIAGNOSIS — M5136 Other intervertebral disc degeneration, lumbar region: Secondary | ICD-10-CM | POA: Diagnosis not present

## 2016-09-24 DIAGNOSIS — Z79891 Long term (current) use of opiate analgesic: Secondary | ICD-10-CM | POA: Diagnosis not present

## 2016-09-24 DIAGNOSIS — G894 Chronic pain syndrome: Secondary | ICD-10-CM | POA: Diagnosis not present

## 2016-09-24 DIAGNOSIS — M50323 Other cervical disc degeneration at C6-C7 level: Secondary | ICD-10-CM | POA: Diagnosis not present

## 2016-09-29 DIAGNOSIS — N958 Other specified menopausal and perimenopausal disorders: Secondary | ICD-10-CM | POA: Diagnosis not present

## 2016-09-29 DIAGNOSIS — Z6830 Body mass index (BMI) 30.0-30.9, adult: Secondary | ICD-10-CM | POA: Diagnosis not present

## 2016-09-29 DIAGNOSIS — M8588 Other specified disorders of bone density and structure, other site: Secondary | ICD-10-CM | POA: Diagnosis not present

## 2016-09-29 DIAGNOSIS — Z1231 Encounter for screening mammogram for malignant neoplasm of breast: Secondary | ICD-10-CM | POA: Diagnosis not present

## 2016-09-29 DIAGNOSIS — Z01419 Encounter for gynecological examination (general) (routine) without abnormal findings: Secondary | ICD-10-CM | POA: Diagnosis not present

## 2016-10-01 ENCOUNTER — Ambulatory Visit: Payer: Medicare HMO | Admitting: Psychology

## 2016-10-01 ENCOUNTER — Other Ambulatory Visit: Payer: Self-pay | Admitting: Cardiology

## 2016-10-01 MED ORDER — POTASSIUM CHLORIDE CRYS ER 20 MEQ PO TBCR: 20.0000 meq | EXTENDED_RELEASE_TABLET | Freq: Two times a day (BID) | ORAL | 4 refills | Status: AC

## 2016-10-15 ENCOUNTER — Ambulatory Visit (INDEPENDENT_AMBULATORY_CARE_PROVIDER_SITE_OTHER): Payer: Medicare HMO | Admitting: Psychology

## 2016-10-15 ENCOUNTER — Other Ambulatory Visit: Payer: Self-pay | Admitting: Cardiology

## 2016-10-15 DIAGNOSIS — F321 Major depressive disorder, single episode, moderate: Secondary | ICD-10-CM | POA: Diagnosis not present

## 2016-10-15 DIAGNOSIS — R69 Illness, unspecified: Secondary | ICD-10-CM | POA: Diagnosis not present

## 2016-10-16 ENCOUNTER — Ambulatory Visit (INDEPENDENT_AMBULATORY_CARE_PROVIDER_SITE_OTHER): Payer: Medicare HMO | Admitting: *Deleted

## 2016-10-16 ENCOUNTER — Other Ambulatory Visit: Payer: Self-pay | Admitting: Cardiology

## 2016-10-16 DIAGNOSIS — I4891 Unspecified atrial fibrillation: Secondary | ICD-10-CM

## 2016-10-16 DIAGNOSIS — Z5181 Encounter for therapeutic drug level monitoring: Secondary | ICD-10-CM | POA: Diagnosis not present

## 2016-10-16 LAB — POCT INR: INR: 2.2

## 2016-10-19 ENCOUNTER — Telehealth: Payer: Self-pay | Admitting: Cardiology

## 2016-10-19 NOTE — Telephone Encounter (Signed)
Pt c/o medication issue:  1. Name of Medication: warfarin   2. How are you currently taking this medication (dosage and times per day)? 65  3. Are you having a reaction (difficulty breathing--STAT)? no 4. What is your medication issue? The manufacture has changed and the provider needs to give the okay

## 2016-10-20 ENCOUNTER — Other Ambulatory Visit: Payer: Self-pay | Admitting: Cardiology

## 2016-10-20 MED ORDER — METOPROLOL TARTRATE 25 MG PO TABS: 25.0000 mg | ORAL_TABLET | Freq: Two times a day (BID) | ORAL | 0 refills | Status: DC

## 2016-10-20 NOTE — Telephone Encounter (Signed)
Pt calling requesting a refill on her coumadin. Please advise

## 2016-10-20 NOTE — Telephone Encounter (Signed)
Spoke with patient and made aware refill sent.

## 2016-10-20 NOTE — Telephone Encounter (Signed)
Spoke to pharmacist at [facility] and they are no longer able to get same manufacturer in stock. Thus the need for the change. Ok to Advertising copywriter.

## 2016-10-23 ENCOUNTER — Telehealth: Payer: Self-pay | Admitting: Cardiology

## 2016-10-23 NOTE — Telephone Encounter (Signed)
Patient wanting an appointment before September. In September patient will be moving to [place]. Next available appointment is in September. Will send to Dr. Theodosia Blender nurse to see if she can find an appointment for patient in August.

## 2016-10-23 NOTE — Telephone Encounter (Signed)
New message    Pt moving to [facility], states that she was called about an earlier appt, and that she was to be put on [place] schedule ? Pt does not want to see APP

## 2016-10-26 ENCOUNTER — Ambulatory Visit (INDEPENDENT_AMBULATORY_CARE_PROVIDER_SITE_OTHER): Payer: Medicare HMO | Admitting: Psychology

## 2016-10-26 DIAGNOSIS — F321 Major depressive disorder, single episode, moderate: Secondary | ICD-10-CM | POA: Diagnosis not present

## 2016-10-26 DIAGNOSIS — R69 Illness, unspecified: Secondary | ICD-10-CM | POA: Diagnosis not present

## 2016-10-27 DIAGNOSIS — M810 Age-related osteoporosis without current pathological fracture: Secondary | ICD-10-CM | POA: Diagnosis not present

## 2016-11-03 DIAGNOSIS — R51 Headache: Secondary | ICD-10-CM | POA: Diagnosis not present

## 2016-11-03 DIAGNOSIS — G43019 Migraine without aura, intractable, without status migrainosus: Secondary | ICD-10-CM | POA: Diagnosis not present

## 2016-11-03 DIAGNOSIS — Z049 Encounter for examination and observation for unspecified reason: Secondary | ICD-10-CM | POA: Diagnosis not present

## 2016-11-03 DIAGNOSIS — Z79899 Other long term (current) drug therapy: Secondary | ICD-10-CM | POA: Diagnosis not present

## 2016-11-04 ENCOUNTER — Other Ambulatory Visit: Payer: Self-pay | Admitting: Cardiology

## 2016-11-06 ENCOUNTER — Other Ambulatory Visit: Payer: Self-pay | Admitting: Cardiology

## 2016-11-06 NOTE — Telephone Encounter (Signed)
Returned call to patient who states that I need to call pharmacy because the pharmacy is unable to fill warfarin due to increased dose (per our record the correct dose was sent on 10/15/16). She stated to call and leave a message as she would not have time to answer phone when I called back.    Spoke with pharmacist and they are processing 65 tablets as 1 month supply - this is the correct dosage. Pt filled 65 tablets on 10/20/16 and insurance will not pay for additional until 11/14/16. This is the correct dose.

## 2016-11-06 NOTE — Telephone Encounter (Signed)
LM for patient as instructed detailing above.

## 2016-11-06 NOTE — Telephone Encounter (Signed)
Pt calling stating that she needs another Rx sent in for her coumadin, because she doesn't have enough medication that was prescribe to cover the increase of her medication. Called pharmacy and they stated that pt pick up Rx on 10/20/16 for 65 tablets and base on what pt stated that she was taken coumadin 10 mg tablet for 5 days and 12.5 mg tablet for 2 days. Pt would like a call back.

## 2016-11-06 NOTE — Telephone Encounter (Signed)
Scheduled patient for early 1 year follow-up 7/17 with Dr. Radford Pax. Patient was grateful for call.

## 2016-11-11 DIAGNOSIS — G43019 Migraine without aura, intractable, without status migrainosus: Secondary | ICD-10-CM | POA: Diagnosis not present

## 2016-11-11 DIAGNOSIS — M542 Cervicalgia: Secondary | ICD-10-CM | POA: Diagnosis not present

## 2016-11-11 DIAGNOSIS — M791 Myalgia: Secondary | ICD-10-CM | POA: Diagnosis not present

## 2016-11-11 DIAGNOSIS — R51 Headache: Secondary | ICD-10-CM | POA: Diagnosis not present

## 2016-11-13 ENCOUNTER — Ambulatory Visit (INDEPENDENT_AMBULATORY_CARE_PROVIDER_SITE_OTHER): Payer: Medicare HMO | Admitting: Psychology

## 2016-11-13 DIAGNOSIS — F321 Major depressive disorder, single episode, moderate: Secondary | ICD-10-CM | POA: Diagnosis not present

## 2016-11-13 DIAGNOSIS — R69 Illness, unspecified: Secondary | ICD-10-CM | POA: Diagnosis not present

## 2016-11-17 ENCOUNTER — Encounter: Payer: Self-pay | Admitting: Cardiology

## 2016-11-17 ENCOUNTER — Ambulatory Visit (INDEPENDENT_AMBULATORY_CARE_PROVIDER_SITE_OTHER): Payer: Medicare HMO | Admitting: Cardiology

## 2016-11-17 VITALS — BP 112/62 | HR 82 | Ht 67.0 in | Wt 186.2 lb

## 2016-11-17 DIAGNOSIS — F17200 Nicotine dependence, unspecified, uncomplicated: Secondary | ICD-10-CM

## 2016-11-17 DIAGNOSIS — Z716 Tobacco abuse counseling: Secondary | ICD-10-CM | POA: Insufficient documentation

## 2016-11-17 DIAGNOSIS — E663 Overweight: Secondary | ICD-10-CM

## 2016-11-17 DIAGNOSIS — E785 Hyperlipidemia, unspecified: Secondary | ICD-10-CM | POA: Diagnosis not present

## 2016-11-17 DIAGNOSIS — I10 Essential (primary) hypertension: Secondary | ICD-10-CM | POA: Diagnosis not present

## 2016-11-17 DIAGNOSIS — I481 Persistent atrial fibrillation: Secondary | ICD-10-CM

## 2016-11-17 DIAGNOSIS — I2510 Atherosclerotic heart disease of native coronary artery without angina pectoris: Secondary | ICD-10-CM

## 2016-11-17 DIAGNOSIS — R69 Illness, unspecified: Secondary | ICD-10-CM | POA: Diagnosis not present

## 2016-11-17 DIAGNOSIS — I255 Ischemic cardiomyopathy: Secondary | ICD-10-CM

## 2016-11-17 DIAGNOSIS — I4819 Other persistent atrial fibrillation: Secondary | ICD-10-CM

## 2016-11-18 NOTE — Progress Notes (Signed)
Cardiology Office Note    Date:  11/18/2016   ID:  [name] 1955/09/13, MRN [id number]  PCP:  Darcus Austin, MD  Cardiologist:  Fransico Him, MD   Chief Complaint  Patient presents with  . Coronary Artery Disease  . Hypertension  . Hyperlipidemia    History of Present Illness:  [name] is a 61 y.o. female with a hx of CAD s/p NSTEMI tx with DES x 2 LAD in 2013, ICM (EF 50%), HTN, HL. She has a history of mild OSA with AHI 11.8 events per hour.It was recommended that she get set up for a CPAP titration but she did not want to proceed.She has a history ofatrial fibrillation with RVR and was started on NOAC for a CHADS2VASC score of 4 but changed to Coumadin due to cost. She is here today for followup and is doing well.  She says that she had pain between her shoulder blades with her MI and still gets that pain but cannot tell if it is her heart or her chronic DJD of her spine.  That pain stopped after her stent but has occurred about 3 times since her MI in 2013 and is very rare in occurrence.  She denies any chest pain or pressure, SOB, DOE, PND, orthopnea, dizziness, palpitationsor syncope. Her  mild LE edema is stable and only occurs if she does not take her Lasix. Her main complaint if declining health due to severe arthritis of her spine.  She is moving permanently to [facility] in September.      Past Medical History:  Diagnosis Date  . Anxiety   . CAD (coronary artery disease), native coronary artery    s/p NSTEMI tx with DES x 2 LAD in 2013  . 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 Chronic pain    "back and legs"  . Degenerative joint disease    cervical disc pain wears  Fentanyl Transdermal patch  . Depression   . Dysrhythmia   . External hemorrhoids   . Hepatitis    Hepatitis A  . History of bronchitis   . History of urinary tract infection   . History of weight loss    11-22-13 100 pounds lost over past year ;as intentional  weight loss.  . Hyperlipemia   . Hypertension   . Migraine headache   . Myocardial infarction ([place]) 06/29/11  . Overweight (BMI 25.0-29.9) 12/07/2012  . Persistent atrial fibrillation ([place]) 02/19/2015  . Pneumonia   . Sepsis ([place])   . Sleep apnea    declined CPAP  . Tobacco abuse counseling     Past Surgical History:  Procedure Laterality Date  . ABDOMINAL HYSTERECTOMY    . ABDOMINAL SURGERY     removal of benign pelvic tumore  . BREAST SURGERY     bilat breast reduction   . COLONOSCOPY WITH PROPOFOL N/A 11/30/2013   Procedure: COLONOSCOPY WITH PROPOFOL;  Surgeon: Juanita Craver, MD;  Location: [facility] ENDOSCOPY;  Service: Endoscopy;  Laterality: N/A;  . CORONARY ANGIOPLASTY WITH STENT PLACEMENT    . JOINT REPLACEMENT     bilateral knee replacements  . KNEE SURGERY     "multiple knee scopes and OR bilaterally; final outcome bilateral knee replacements"  . LEFT HEART CATHETERIZATION WITH CORONARY ANGIOGRAM N/A 06/30/2011   Procedure: LEFT HEART CATHETERIZATION WITH CORONARY ANGIOGRAM;  Surgeon: Jettie Booze, MD;  Location[facility] CATH LAB;  Service: Cardiovascular;  Laterality:  N/A;  . PERCUTANEOUS CORONARY STENT INTERVENTION (PCI-S)  06/30/2011   Procedure: PERCUTANEOUS CORONARY STENT INTERVENTION (PCI-S);  Surgeon: Jettie Booze, MD;  Location: [facility] CATH LAB;  Service: Cardiovascular;;  . REPLACEMENT TOTAL KNEE    . THYROID LOBECTOMY Left 01/09/2016   Procedure: LEFT THYROID LOBECTOMY;  Surgeon: Armandina Gemma, MD;  Location: [facility] ORS;  Service: General;  Laterality: Left;  . TMJ ARTHROPLASTY    . TONSILLECTOMY AND ADENOIDECTOMY      Current Medications: Current Meds  Medication Sig  . ALPRAZolam (XANAX) 0.5 MG tablet Take 0.5 mg by mouth 3 (three) times daily as needed for anxiety.   Marland Kitchen aspirin 81 MG tablet Take 81 mg by mouth daily.  Marland Kitchen atorvastatin (LIPITOR) 80 MG tablet TAKE ONE TABLET BY MOUTH ONCE DAILY  . baclofen (LIORESAL) 10 MG tablet Take 10 mg by mouth 3 (three) times daily  as needed for muscle spasms.   Marland Kitchen docusate sodium (COLACE) 100 MG capsule Take 300 mg by mouth daily as needed for moderate constipation.   Marland Kitchen ezetimibe (ZETIA) 10 MG tablet Take 1 tablet (10 mg total) by mouth daily.  . furosemide (LASIX) 40 MG tablet TAKE ONE TABLET BY MOUTH ONCE DAILY  . gabapentin (NEURONTIN) 400 MG capsule Take 400 mg by mouth at bedtime.  Marland Kitchen ibuprofen (ADVIL,MOTRIN) 600 MG tablet Take 600 mg by mouth every 6 (six) hours as needed.  . metoprolol tartrate (LOPRESSOR) 25 MG tablet Take 1 tablet (25 mg total) by mouth 2 (two) times daily.  Marland Kitchen morphine (MS CONTIN) 15 MG 12 hr tablet Take 15 mg by mouth 3 (three) times daily.  . nitroGLYCERIN (NITROSTAT) 0.4 MG SL tablet Place 1 tablet (0.4 mg total) under the tongue every 5 (five) minutes as needed for chest pain.  . potassium chloride SA (KLOR-CON M20) 20 MEQ tablet Take 1 tablet (20 mEq total) by mouth 2 (two) times daily.  Marland Kitchen PROAIR HFA 108 (90 BASE) MCG/ACT inhaler Inhale 2 puffs into the lungs as needed for wheezing or shortness of breath. For wheezing  . warfarin (COUMADIN) 5 MG tablet TAKE AS DIRECTED  . warfarin (COUMADIN) 5 MG tablet TAKE AS DIRECTED    Allergies:   Patient has no known allergies.   Social History   Social History  . Marital status: Single    Spouse name: N/A  . Number of children: 0  . Years of education: N/A   Social History Main Topics  . Smoking status: Heavy Tobacco Smoker    Packs/day: 1.00    Years: 30.00    Types: Cigarettes, E-cigarettes    Start date: 01/29/2014  . Smokeless tobacco: Never Used     Comment: smoking consult entered 06/30/11 1503. / 11-22-13 states about 6 cigarettes per day  . Alcohol use 0.0 oz/week     Comment: rare  . Drug use: No  . Sexual activity: Not Currently   Other Topics Concern  . None   Social History Narrative  . None     Family History:  The patient's family history includes Heart attack in her mother; Heart failure in her mother; Rectal cancer  in her father.   ROS:   Please see the history of present illness.    ROS All other systems reviewed and are negative.  No flowsheet data found.     PHYSICAL EXAM:   VS:  BP 112/62   Pulse 82   Ht 5\' 7"  (1.702 m)   Wt 186 lb 3.2 oz (84.5  kg)   BMI 29.16 kg/m    GEN: Well nourished, well developed, in no acute distress  HEENT: normal  Neck: no JVD, carotid bruits, or masses Cardiac: RRR; no murmurs, rubs, or gallops,no edema.  Intact distal pulses bilaterally.  Respiratory:  clear to auscultation bilaterally, normal work of breathing GI: soft, nontender, nondistended, + BS MS: no deformity or atrophy  Skin: warm and dry, no rash Neuro:  Alert and Oriented x 3, Strength and sensation are intact Psych: euthymic mood, full affect  Wt Readings from Last 3 Encounters:  11/17/16 186 lb 3.2 oz (84.5 kg)  02/05/16 157 lb (71.2 kg)  01/09/16 154 lb (69.9 kg)      Studies/Labs Reviewed:   EKG:  EKG is not ordered today.   Recent Labs: 01/08/2016: BUN 18; Creatinine, Ser 0.71; Hemoglobin 14.6; Platelets 238; Potassium 4.0; Sodium 138 02/14/2016: ALT 27   Lipid Panel    Component Value Date/Time   CHOL 135 02/14/2016 1137   TRIG 61 02/14/2016 1137   HDL 54 02/14/2016 1137   CHOLHDL 2.5 02/14/2016 1137   VLDL 12 02/14/2016 1137   LDLCALC 69 02/14/2016 1137    Additional studies/ records that were reviewed today include:  none    ASSESSMENT:    1. Atherosclerosis of native coronary artery of native heart without angina pectoris   2. Cardiomyopathy, ischemic   3. Persistent atrial fibrillation ([place])   4. Essential hypertension   5. Dyslipidemia   6. TOBACCO USER   7. Tobacco abuse counseling   8. Overweight (BMI 25.0-29.9)      PLAN:  In order of problems listed above:  1.  ASCAD s/p NSTEMI tx with DES x 2 LAD in 2013.  She has chronic stable angina.  Her symptoms with her MI was pain between her shoulder blades.  She has had this 3 times since her MI in 2013  and occurs very rarely.  She will continue on ASA 81mg  daily, statin and BB.  2.  Ischemic DCM with last EF assessment 50%.  She appears euvolemic on exam today with stable weight. She will continue on BB and diuretic.   3.  Persistent atrial fibrillation - she is maintaining NSR on exam.  Occasionally she will have some palpitations at night but xanax makes it go away.  She will continue on Lopressor 25mg  BID and warfarin.  Her anticoagulation is followed in our coumadin clinic.    4.  HTN - BP well controlled on exam today. She will continue on BB.  5.  Dyslipidemia with LDL goal < 70.  I will check an FLP and ALT.  She will continue on high dose atorvastatin 80mg  daily and Zetia 10mg  daily.    6.  Overweight - she has gained 29lbs since last fall but is very sedentary due to her underlying severe DJD of the spine. I have offered her a nutrition consult.       Medication Adjustments/Labs and Tests Ordered: Current medicines are reviewed at length with the patient today.  Concerns regarding medicines are outlined above.  Medication changes, Labs and Tests ordered today are listed in the Patient Instructions below.  There are no Patient Instructions on file for this visit.   Signed, Fransico Him, MD  11/18/2016 9:34 AM    [address], [place], [place]  [address] Phone: [phone]; Fax: [phone]

## 2016-11-23 ENCOUNTER — Other Ambulatory Visit: Payer: Self-pay

## 2016-11-26 ENCOUNTER — Ambulatory Visit (INDEPENDENT_AMBULATORY_CARE_PROVIDER_SITE_OTHER): Payer: Medicare HMO | Admitting: Psychology

## 2016-11-26 DIAGNOSIS — F321 Major depressive disorder, single episode, moderate: Secondary | ICD-10-CM

## 2016-11-26 DIAGNOSIS — R69 Illness, unspecified: Secondary | ICD-10-CM | POA: Diagnosis not present

## 2016-11-27 ENCOUNTER — Ambulatory Visit (INDEPENDENT_AMBULATORY_CARE_PROVIDER_SITE_OTHER): Payer: Medicare HMO | Admitting: *Deleted

## 2016-11-27 ENCOUNTER — Other Ambulatory Visit: Payer: Medicare HMO | Admitting: *Deleted

## 2016-11-27 DIAGNOSIS — I2510 Atherosclerotic heart disease of native coronary artery without angina pectoris: Secondary | ICD-10-CM

## 2016-11-27 DIAGNOSIS — I4819 Other persistent atrial fibrillation: Secondary | ICD-10-CM

## 2016-11-27 DIAGNOSIS — I4891 Unspecified atrial fibrillation: Secondary | ICD-10-CM

## 2016-11-27 DIAGNOSIS — I481 Persistent atrial fibrillation: Secondary | ICD-10-CM | POA: Diagnosis not present

## 2016-11-27 DIAGNOSIS — E785 Hyperlipidemia, unspecified: Secondary | ICD-10-CM | POA: Diagnosis not present

## 2016-11-27 DIAGNOSIS — Z5181 Encounter for therapeutic drug level monitoring: Secondary | ICD-10-CM | POA: Diagnosis not present

## 2016-11-27 LAB — HEPATIC FUNCTION PANEL
ALK PHOS: 70 IU/L (ref 39–117)
ALT: 18 IU/L (ref 0–32)
AST: 18 IU/L (ref 0–40)
Albumin: 4.1 g/dL (ref 3.6–4.8)
BILIRUBIN, DIRECT: 0.07 mg/dL (ref 0.00–0.40)
Bilirubin Total: 0.3 mg/dL (ref 0.0–1.2)
Total Protein: 6.1 g/dL (ref 6.0–8.5)

## 2016-11-27 LAB — LIPID PANEL
CHOL/HDL RATIO: 2.8 ratio (ref 0.0–4.4)
Cholesterol, Total: 154 mg/dL (ref 100–199)
HDL: 56 mg/dL (ref >39)
LDL Calculated: 85 mg/dL (ref 0–99)
TRIGLYCERIDES: 64 mg/dL (ref 0–149)
VLDL Cholesterol Cal: 13 mg/dL (ref 5–40)

## 2016-11-27 LAB — BASIC METABOLIC PANEL
BUN/Creatinine Ratio: 24 (ref 12–28)
BUN: 16 mg/dL (ref 8–27)
CALCIUM: 8.6 mg/dL — AB (ref 8.7–10.3)
CO2: 20 mmol/L (ref 20–29)
Chloride: 105 mmol/L (ref 96–106)
Creatinine, Ser: 0.68 mg/dL (ref 0.57–1.00)
GFR calc Af Amer: 108 mL/min/{1.73_m2} (ref >59)
GFR, EST NON AFRICAN AMERICAN: 94 mL/min/{1.73_m2} (ref >59)
Glucose: 99 mg/dL (ref 65–99)
POTASSIUM: 3.9 mmol/L (ref 3.5–5.2)
Sodium: 140 mmol/L (ref 134–144)

## 2016-11-27 LAB — POCT INR: INR: 2.2

## 2016-11-30 DIAGNOSIS — L570 Actinic keratosis: Secondary | ICD-10-CM | POA: Diagnosis not present

## 2016-11-30 DIAGNOSIS — L821 Other seborrheic keratosis: Secondary | ICD-10-CM | POA: Diagnosis not present

## 2016-12-03 ENCOUNTER — Encounter: Payer: Self-pay | Admitting: Cardiology

## 2016-12-10 ENCOUNTER — Telehealth: Payer: Self-pay | Admitting: Cardiology

## 2016-12-10 NOTE — Telephone Encounter (Signed)
Informed patient of results and verbal understanding expressed.  Patient is moving to [place] in September and does not wish to make any medication changes at this time. She requests her labs sent to Dr. Velta Addison in [facility]. She was grateful for call.

## 2016-12-10 NOTE — Telephone Encounter (Signed)
Correction: about lab work . Pt states RN [place] called

## 2016-12-10 NOTE — Telephone Encounter (Signed)
-----   Message from Leeroy Bock, [facility] sent at 11/30/2016  9:56 AM EDT ----- Lipids previously at goal over the past few years on Lipitor 80mg  daily and Zetia 10mg  daily. Would make sure that pt is still adherent to both medications and that there have not been large dietary changes since last lipid check. If she reports adherence, would see if pt is willing to be scheduled in lipid clinic to discuss [id number] to bring LDL to goal < 70 given hx of ASCVD.

## 2016-12-10 NOTE — Telephone Encounter (Signed)
F/u message  Pt returning RN call about work. Please call back to discuss

## 2016-12-14 DIAGNOSIS — H2513 Age-related nuclear cataract, bilateral: Secondary | ICD-10-CM | POA: Diagnosis not present

## 2016-12-17 ENCOUNTER — Ambulatory Visit (INDEPENDENT_AMBULATORY_CARE_PROVIDER_SITE_OTHER): Payer: Medicare HMO | Admitting: Psychology

## 2016-12-17 DIAGNOSIS — R69 Illness, unspecified: Secondary | ICD-10-CM | POA: Diagnosis not present

## 2016-12-17 DIAGNOSIS — F321 Major depressive disorder, single episode, moderate: Secondary | ICD-10-CM | POA: Diagnosis not present

## 2016-12-18 ENCOUNTER — Ambulatory Visit: Payer: Medicare HMO | Admitting: Psychology

## 2016-12-20 ENCOUNTER — Other Ambulatory Visit: Payer: Self-pay | Admitting: Cardiology

## 2016-12-21 ENCOUNTER — Telehealth: Payer: Self-pay | Admitting: *Deleted

## 2016-12-21 ENCOUNTER — Other Ambulatory Visit: Payer: Self-pay | Admitting: *Deleted

## 2016-12-21 NOTE — Telephone Encounter (Signed)
Patient left a msg on the refill vm stating that she is in desperate need of a refill as she just realized that she takes her last dose today but she does not have enough to take her full prescribed dose. She would like a call at [phone] when this has been sent in. Thanks, MI

## 2016-12-21 NOTE — Telephone Encounter (Signed)
Pt called to states she needs a refill on her Warfarin that was just sent in today. Advised pt that she told [facility] she would be moving to [facility] therefore, we gave her a 30 day supply. She stated " no she is moving the 3rd week in September & will need some time to get an appt with Cardiologist in [facility]. Advise that we can set her another appt in September to come into the office to be seen vefore going out of town since she will not be managed until latter part of September which will be greater than 6 weeks. She stated she use to go more than 2 months before having INR checked & advised that we must see her before sending in refills. She agreed to come in to office in September before moving to have INR checked. Appt set for pt to return for INR rechecked. Also, pt is aware that on her visit in September her refill will be sent at that time.

## 2016-12-21 NOTE — Telephone Encounter (Signed)
Warfarin refill received & pt will be moving to [place] & was told on her last CV[facility] visit that she would need to have future refills done with her Cardiologist in [place], will send in this last refill for pt as she leaves for [place] in September.

## 2016-12-25 ENCOUNTER — Ambulatory Visit (INDEPENDENT_AMBULATORY_CARE_PROVIDER_SITE_OTHER): Payer: Medicare HMO | Admitting: Psychology

## 2016-12-25 DIAGNOSIS — R69 Illness, unspecified: Secondary | ICD-10-CM | POA: Diagnosis not present

## 2016-12-25 DIAGNOSIS — F321 Major depressive disorder, single episode, moderate: Secondary | ICD-10-CM

## 2016-12-29 ENCOUNTER — Other Ambulatory Visit: Payer: Self-pay | Admitting: Cardiology

## 2016-12-29 MED ORDER — NITROGLYCERIN 0.4 MG SL SUBL
0.4000 mg | SUBLINGUAL_TABLET | SUBLINGUAL | 5 refills | Status: AC | PRN
Start: 2016-12-29 — End: ?

## 2016-12-29 NOTE — Telephone Encounter (Signed)
Pt's medication was sent to pt's pharmacy as requested. Confirmation received.  °

## 2017-01-05 DIAGNOSIS — M5136 Other intervertebral disc degeneration, lumbar region: Secondary | ICD-10-CM | POA: Diagnosis not present

## 2017-01-05 DIAGNOSIS — G894 Chronic pain syndrome: Secondary | ICD-10-CM | POA: Diagnosis not present

## 2017-01-06 ENCOUNTER — Ambulatory Visit (INDEPENDENT_AMBULATORY_CARE_PROVIDER_SITE_OTHER): Payer: Medicare HMO | Admitting: Psychology

## 2017-01-06 DIAGNOSIS — F321 Major depressive disorder, single episode, moderate: Secondary | ICD-10-CM

## 2017-01-06 DIAGNOSIS — R69 Illness, unspecified: Secondary | ICD-10-CM | POA: Diagnosis not present

## 2017-01-08 ENCOUNTER — Ambulatory Visit (INDEPENDENT_AMBULATORY_CARE_PROVIDER_SITE_OTHER): Payer: Medicare HMO | Admitting: *Deleted

## 2017-01-08 ENCOUNTER — Other Ambulatory Visit: Payer: Self-pay | Admitting: *Deleted

## 2017-01-08 DIAGNOSIS — Z5181 Encounter for therapeutic drug level monitoring: Secondary | ICD-10-CM | POA: Diagnosis not present

## 2017-01-08 DIAGNOSIS — I4891 Unspecified atrial fibrillation: Secondary | ICD-10-CM

## 2017-01-08 LAB — POCT INR: INR: 3.2

## 2017-01-08 MED ORDER — WARFARIN SODIUM 5 MG PO TABS: ORAL_TABLET | ORAL | 0 refills | Status: AC

## 2017-01-11 ENCOUNTER — Other Ambulatory Visit: Payer: Self-pay | Admitting: Cardiology

## 2018-03-16 IMAGING — US US THYROID BIOPSY
1 series · 13 of 16 positions shown · non-contrast
Comparison: Ultrasound on 11/26/2015

INDICATION: Incidental finding of a left thyroid nodule on recent CT scan
confirmed by thyroid ultrasound. Request has been made for biopsy.

EXAM:
ULTRASOUND GUIDED NEEDLE ASPIRATE BIOPSY OF THE THYROID GLAND

[Series 1: us thyroid biopsy · 0.09mm/px · 16 acquisitions, 13 frames shown]
[im 1/16]
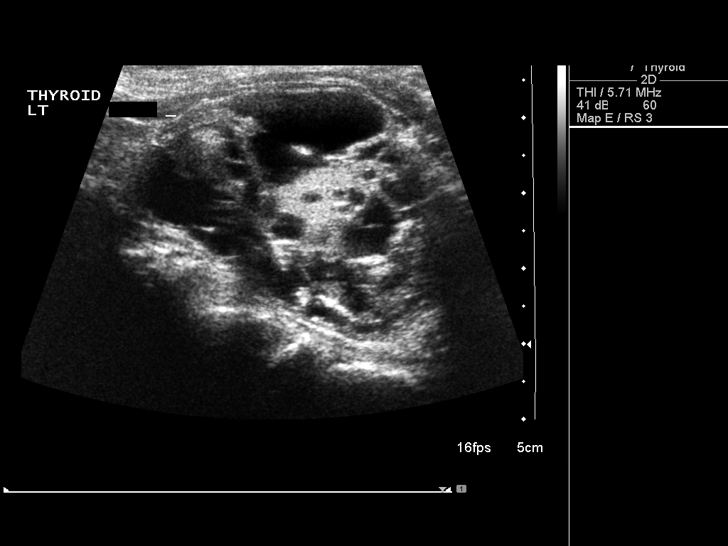
[im 2/16]
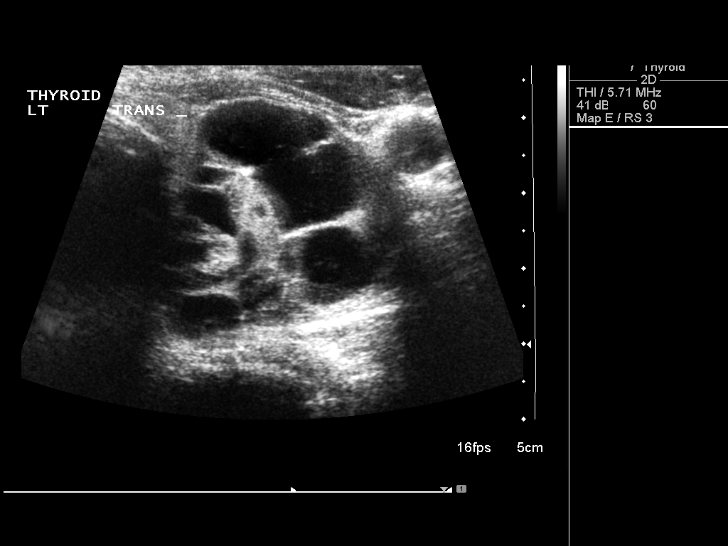
[im 4/16]
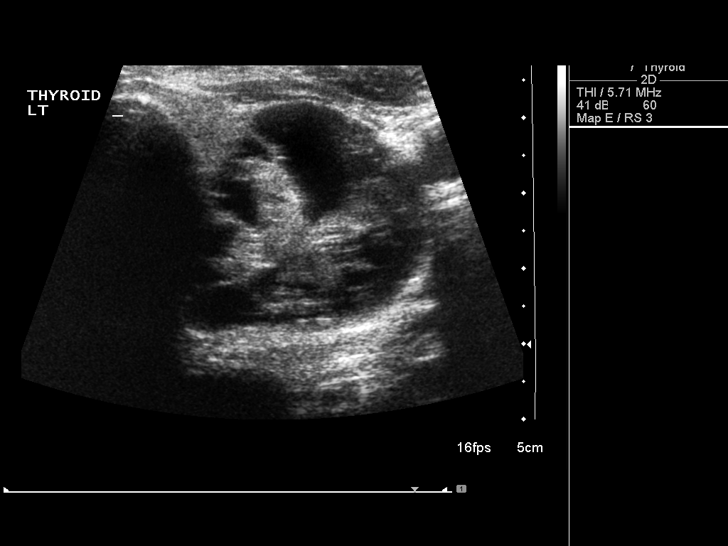
[im 5/16]
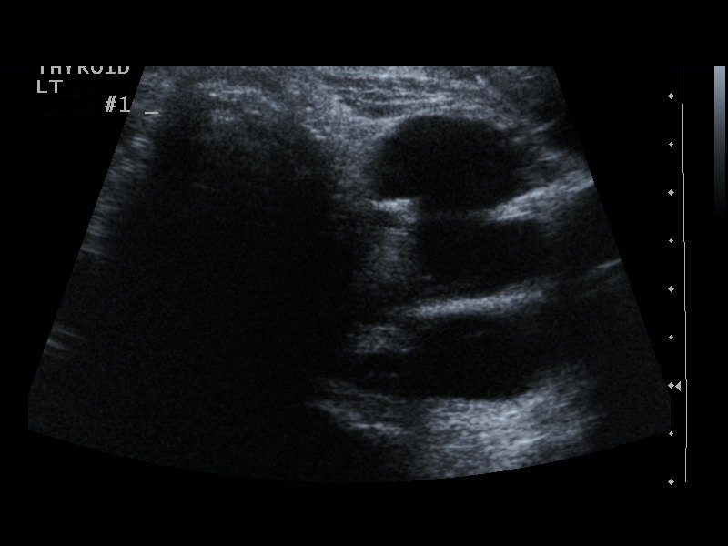
[im 6/16]
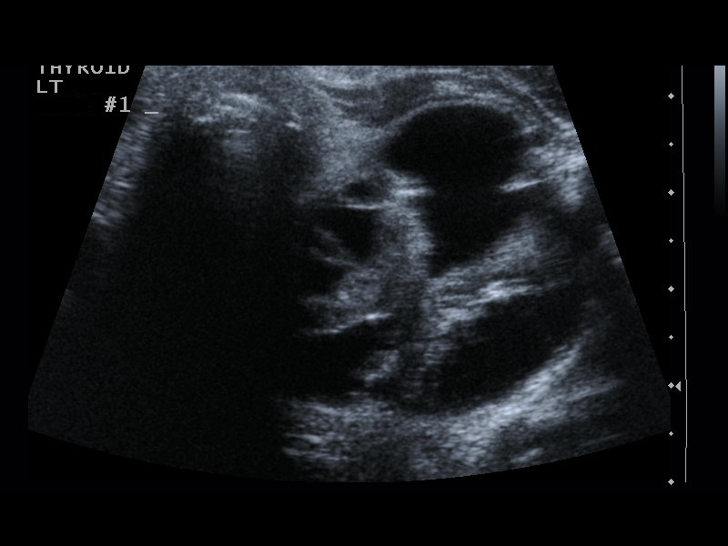
[im 7/16]
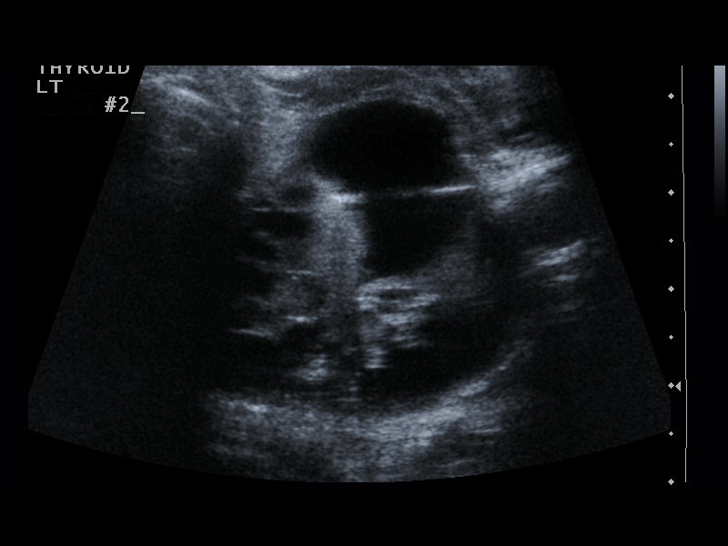
[im 9/16]
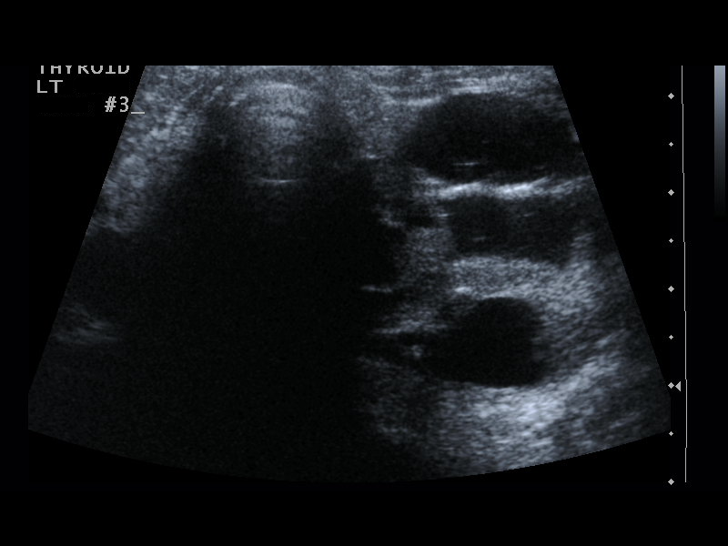
[im 10/16]
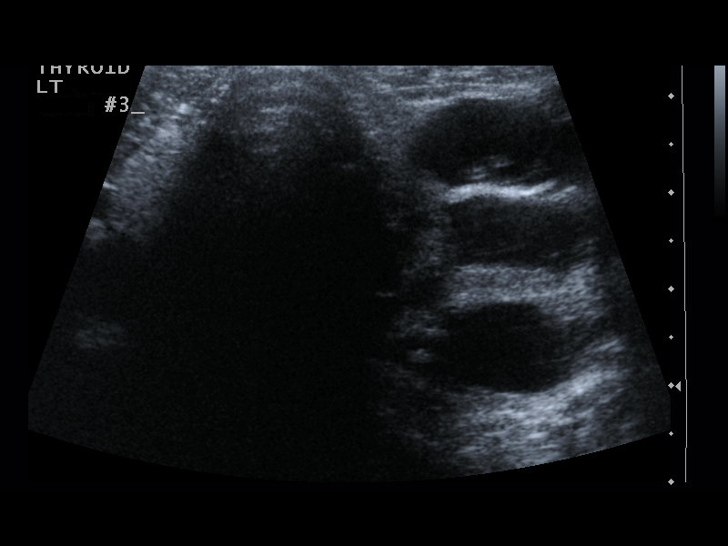
[im 11/16]
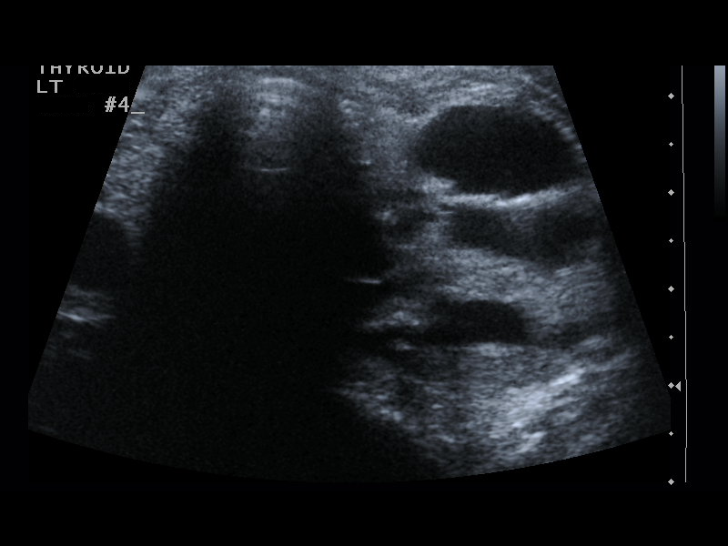
[im 12/16]
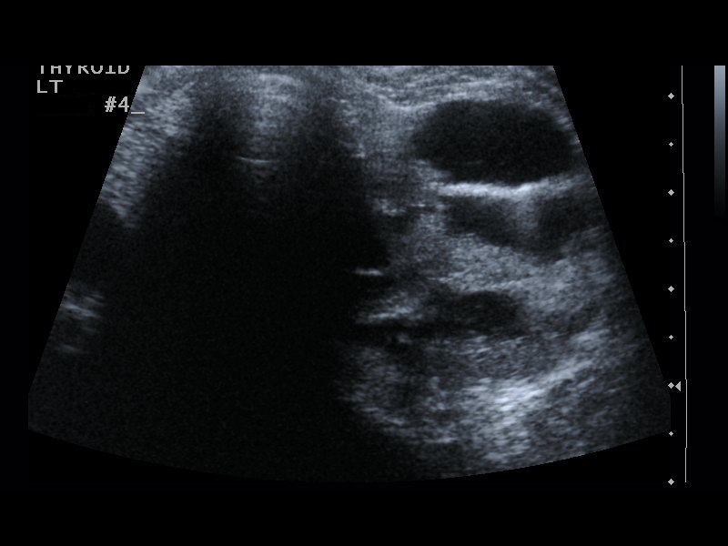
[im 13/16]
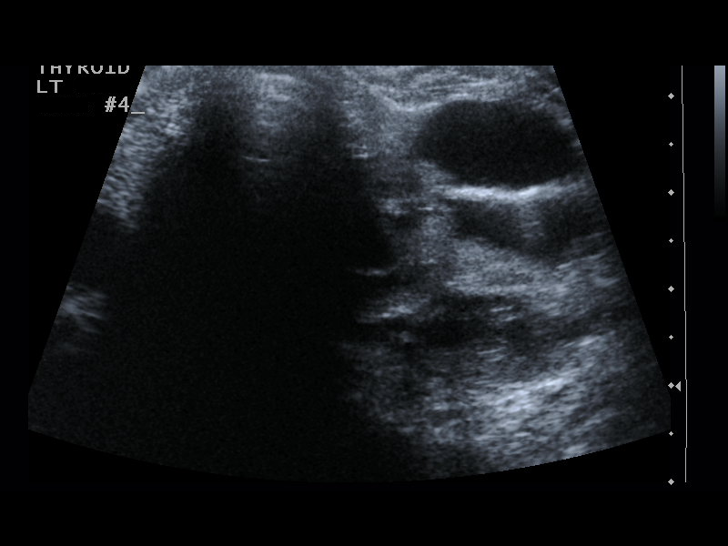
[im 15/16]
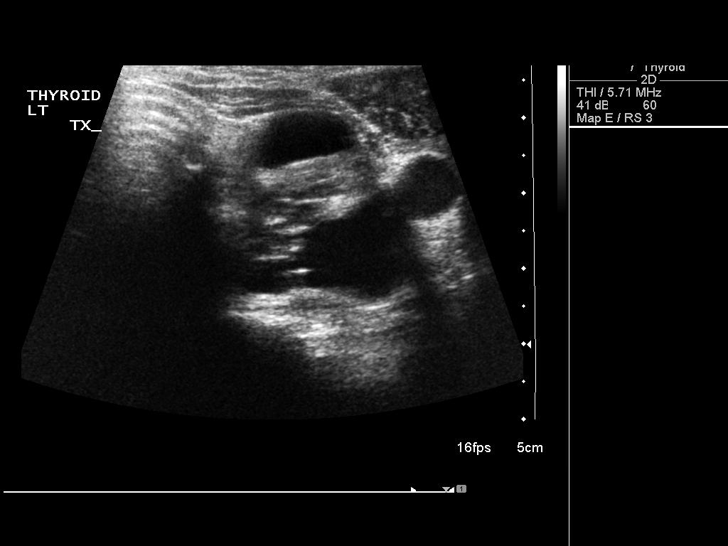
[im 16/16]
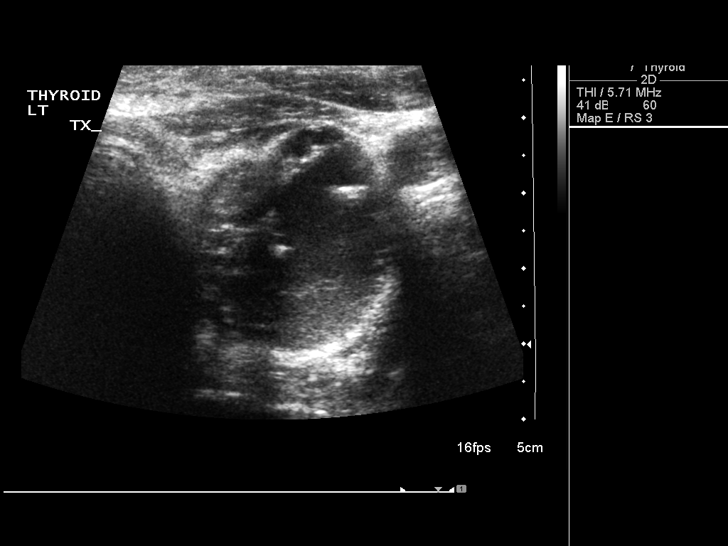

[13 of 16 positions shown; findings below may reference images not displayed]

MEDICATIONS:
1% lidocaine

COMPLICATIONS:
None immediate.

PROCEDURE:
Informed written consent was obtained from the patient after a
thorough discussion of the procedural risks, benefits and
alternatives. All questions were addressed. Maximal Sterile Barrier
Technique was utilized including caps, mask, sterile gowns, sterile
gloves, sterile drape, hand hygiene and skin antiseptic. A timeout
was performed prior to the initiation of the procedure.

Ultrasound was performed to localize and mark an adequate site for
the biopsy. The patient was then prepped and draped in a normal
sterile fashion. Local anesthesia was provided with 1% lidocaine.
Using direct ultrasound guidance, 4 passes were made using 25 gauge
needles into the nodule within the left lobe of the thyroid.
Ultrasound was used to confirm needle placements on all occasions.
Specimens were sent to Pathology for analysis.
IMPRESSION: Ultrasound guided needle aspirate biopsy performed of the left
thyroid nodule.
